# Patient Record
Sex: Male | Born: 1940
Health system: Southern US, Community
[De-identification: ages and names within clinical notes are randomized; demographics above are authoritative.]

## PROBLEM LIST (undated history)

## (undated) DIAGNOSIS — N183 Chronic kidney disease, stage 3 unspecified: Secondary | ICD-10-CM

## (undated) DIAGNOSIS — I059 Rheumatic mitral valve disease, unspecified: Secondary | ICD-10-CM

## (undated) DIAGNOSIS — I10 Essential (primary) hypertension: Secondary | ICD-10-CM

## (undated) DIAGNOSIS — R7303 Prediabetes: Secondary | ICD-10-CM

## (undated) DIAGNOSIS — I255 Ischemic cardiomyopathy: Secondary | ICD-10-CM

## (undated) DIAGNOSIS — I2510 Atherosclerotic heart disease of native coronary artery without angina pectoris: Secondary | ICD-10-CM

## (undated) DIAGNOSIS — M109 Gout, unspecified: Secondary | ICD-10-CM

## (undated) DIAGNOSIS — Z87442 Personal history of urinary calculi: Secondary | ICD-10-CM

## (undated) DIAGNOSIS — K219 Gastro-esophageal reflux disease without esophagitis: Secondary | ICD-10-CM

## (undated) DIAGNOSIS — M1990 Unspecified osteoarthritis, unspecified site: Secondary | ICD-10-CM

## (undated) HISTORY — DX: Essential (primary) hypertension: I10

## (undated) HISTORY — PX: CHOLECYSTECTOMY: SHX55

## (undated) HISTORY — DX: Chronic kidney disease, stage 3 (moderate): N18.3

## (undated) HISTORY — DX: Rheumatic mitral valve disease, unspecified: I05.9

## (undated) HISTORY — PX: TONSILLECTOMY: SUR1361

## (undated) HISTORY — DX: Ischemic cardiomyopathy: I25.5

## (undated) HISTORY — DX: Gout, unspecified: M10.9

## (undated) HISTORY — PX: LITHOTRIPSY: SUR834

## (undated) HISTORY — DX: Chronic kidney disease, stage 3 unspecified: N18.30

---

## 1997-08-12 ENCOUNTER — Other Ambulatory Visit: Admission: RE | Admit: 1997-08-12 | Discharge: 1997-08-12 | Payer: Self-pay | Admitting: Dermatology

## 2007-08-28 ENCOUNTER — Ambulatory Visit: Payer: Self-pay | Admitting: Cardiology

## 2008-09-05 ENCOUNTER — Ambulatory Visit: Payer: Self-pay | Admitting: Cardiology

## 2011-12-27 DIAGNOSIS — E7800 Pure hypercholesterolemia, unspecified: Secondary | ICD-10-CM | POA: Diagnosis not present

## 2012-03-11 DIAGNOSIS — R197 Diarrhea, unspecified: Secondary | ICD-10-CM | POA: Diagnosis not present

## 2012-03-11 DIAGNOSIS — R109 Unspecified abdominal pain: Secondary | ICD-10-CM | POA: Diagnosis not present

## 2012-06-30 DIAGNOSIS — J209 Acute bronchitis, unspecified: Secondary | ICD-10-CM | POA: Diagnosis not present

## 2012-07-10 DIAGNOSIS — J111 Influenza due to unidentified influenza virus with other respiratory manifestations: Secondary | ICD-10-CM | POA: Diagnosis not present

## 2012-07-17 DIAGNOSIS — K8020 Calculus of gallbladder without cholecystitis without obstruction: Secondary | ICD-10-CM | POA: Diagnosis not present

## 2012-07-20 DIAGNOSIS — K810 Acute cholecystitis: Secondary | ICD-10-CM | POA: Diagnosis not present

## 2012-07-20 DIAGNOSIS — E7800 Pure hypercholesterolemia, unspecified: Secondary | ICD-10-CM | POA: Diagnosis not present

## 2012-08-04 DIAGNOSIS — I10 Essential (primary) hypertension: Secondary | ICD-10-CM | POA: Diagnosis not present

## 2012-08-04 DIAGNOSIS — M109 Gout, unspecified: Secondary | ICD-10-CM | POA: Diagnosis not present

## 2012-09-20 DIAGNOSIS — E7800 Pure hypercholesterolemia, unspecified: Secondary | ICD-10-CM | POA: Diagnosis not present

## 2012-09-30 DIAGNOSIS — J019 Acute sinusitis, unspecified: Secondary | ICD-10-CM | POA: Diagnosis not present

## 2012-09-30 DIAGNOSIS — B879 Myiasis, unspecified: Secondary | ICD-10-CM | POA: Diagnosis not present

## 2012-09-30 DIAGNOSIS — J309 Allergic rhinitis, unspecified: Secondary | ICD-10-CM | POA: Diagnosis not present

## 2012-10-05 DIAGNOSIS — Z0000 Encounter for general adult medical examination without abnormal findings: Secondary | ICD-10-CM | POA: Diagnosis not present

## 2012-10-05 DIAGNOSIS — R011 Cardiac murmur, unspecified: Secondary | ICD-10-CM | POA: Diagnosis not present

## 2012-10-05 DIAGNOSIS — I10 Essential (primary) hypertension: Secondary | ICD-10-CM | POA: Diagnosis not present

## 2012-10-05 DIAGNOSIS — R002 Palpitations: Secondary | ICD-10-CM | POA: Diagnosis not present

## 2012-10-05 DIAGNOSIS — E7800 Pure hypercholesterolemia, unspecified: Secondary | ICD-10-CM | POA: Diagnosis not present

## 2012-10-05 DIAGNOSIS — M109 Gout, unspecified: Secondary | ICD-10-CM | POA: Diagnosis not present

## 2012-11-15 DIAGNOSIS — N200 Calculus of kidney: Secondary | ICD-10-CM | POA: Diagnosis not present

## 2012-11-15 DIAGNOSIS — R109 Unspecified abdominal pain: Secondary | ICD-10-CM | POA: Diagnosis not present

## 2012-11-17 DIAGNOSIS — N200 Calculus of kidney: Secondary | ICD-10-CM | POA: Diagnosis not present

## 2012-11-17 DIAGNOSIS — I10 Essential (primary) hypertension: Secondary | ICD-10-CM | POA: Diagnosis not present

## 2012-11-17 DIAGNOSIS — R3129 Other microscopic hematuria: Secondary | ICD-10-CM | POA: Diagnosis not present

## 2012-11-17 DIAGNOSIS — N23 Unspecified renal colic: Secondary | ICD-10-CM | POA: Diagnosis not present

## 2012-11-17 DIAGNOSIS — N201 Calculus of ureter: Secondary | ICD-10-CM | POA: Diagnosis not present

## 2012-11-20 DIAGNOSIS — N201 Calculus of ureter: Secondary | ICD-10-CM | POA: Diagnosis not present

## 2012-11-20 DIAGNOSIS — Z01810 Encounter for preprocedural cardiovascular examination: Secondary | ICD-10-CM | POA: Diagnosis not present

## 2012-11-20 DIAGNOSIS — R3129 Other microscopic hematuria: Secondary | ICD-10-CM | POA: Diagnosis not present

## 2012-11-20 DIAGNOSIS — N200 Calculus of kidney: Secondary | ICD-10-CM | POA: Diagnosis not present

## 2012-11-20 DIAGNOSIS — I10 Essential (primary) hypertension: Secondary | ICD-10-CM | POA: Diagnosis not present

## 2012-11-20 DIAGNOSIS — Z01812 Encounter for preprocedural laboratory examination: Secondary | ICD-10-CM | POA: Diagnosis not present

## 2012-11-20 DIAGNOSIS — I4949 Other premature depolarization: Secondary | ICD-10-CM | POA: Diagnosis not present

## 2012-11-20 DIAGNOSIS — Z01818 Encounter for other preprocedural examination: Secondary | ICD-10-CM | POA: Diagnosis not present

## 2012-11-20 DIAGNOSIS — R350 Frequency of micturition: Secondary | ICD-10-CM | POA: Diagnosis not present

## 2012-11-22 DIAGNOSIS — N200 Calculus of kidney: Secondary | ICD-10-CM | POA: Diagnosis not present

## 2012-11-22 DIAGNOSIS — R350 Frequency of micturition: Secondary | ICD-10-CM | POA: Diagnosis not present

## 2012-11-22 DIAGNOSIS — I4949 Other premature depolarization: Secondary | ICD-10-CM | POA: Diagnosis not present

## 2012-11-22 DIAGNOSIS — I10 Essential (primary) hypertension: Secondary | ICD-10-CM | POA: Diagnosis not present

## 2012-11-22 DIAGNOSIS — R3129 Other microscopic hematuria: Secondary | ICD-10-CM | POA: Diagnosis not present

## 2012-11-22 DIAGNOSIS — N201 Calculus of ureter: Secondary | ICD-10-CM | POA: Diagnosis not present

## 2012-12-04 DIAGNOSIS — I059 Rheumatic mitral valve disease, unspecified: Secondary | ICD-10-CM | POA: Diagnosis not present

## 2012-12-04 DIAGNOSIS — R011 Cardiac murmur, unspecified: Secondary | ICD-10-CM | POA: Diagnosis not present

## 2012-12-04 DIAGNOSIS — R079 Chest pain, unspecified: Secondary | ICD-10-CM | POA: Diagnosis not present

## 2012-12-07 DIAGNOSIS — S37019A Minor contusion of unspecified kidney, initial encounter: Secondary | ICD-10-CM | POA: Diagnosis not present

## 2012-12-07 DIAGNOSIS — N200 Calculus of kidney: Secondary | ICD-10-CM | POA: Diagnosis not present

## 2012-12-07 DIAGNOSIS — R3129 Other microscopic hematuria: Secondary | ICD-10-CM | POA: Diagnosis not present

## 2012-12-07 DIAGNOSIS — N201 Calculus of ureter: Secondary | ICD-10-CM | POA: Diagnosis not present

## 2012-12-11 DIAGNOSIS — R3129 Other microscopic hematuria: Secondary | ICD-10-CM | POA: Diagnosis not present

## 2012-12-11 DIAGNOSIS — N200 Calculus of kidney: Secondary | ICD-10-CM | POA: Diagnosis not present

## 2012-12-12 DIAGNOSIS — D485 Neoplasm of uncertain behavior of skin: Secondary | ICD-10-CM | POA: Diagnosis not present

## 2012-12-12 DIAGNOSIS — L570 Actinic keratosis: Secondary | ICD-10-CM | POA: Diagnosis not present

## 2012-12-12 DIAGNOSIS — L7211 Pilar cyst: Secondary | ICD-10-CM | POA: Diagnosis not present

## 2012-12-19 DIAGNOSIS — E538 Deficiency of other specified B group vitamins: Secondary | ICD-10-CM | POA: Diagnosis not present

## 2012-12-19 DIAGNOSIS — R5381 Other malaise: Secondary | ICD-10-CM | POA: Diagnosis not present

## 2012-12-25 DIAGNOSIS — R350 Frequency of micturition: Secondary | ICD-10-CM | POA: Diagnosis not present

## 2012-12-25 DIAGNOSIS — S37019A Minor contusion of unspecified kidney, initial encounter: Secondary | ICD-10-CM | POA: Diagnosis not present

## 2012-12-25 DIAGNOSIS — I10 Essential (primary) hypertension: Secondary | ICD-10-CM | POA: Diagnosis not present

## 2012-12-25 DIAGNOSIS — N200 Calculus of kidney: Secondary | ICD-10-CM | POA: Diagnosis not present

## 2013-01-14 DIAGNOSIS — E119 Type 2 diabetes mellitus without complications: Secondary | ICD-10-CM | POA: Diagnosis not present

## 2013-01-14 DIAGNOSIS — I10 Essential (primary) hypertension: Secondary | ICD-10-CM | POA: Diagnosis not present

## 2013-01-14 DIAGNOSIS — R799 Abnormal finding of blood chemistry, unspecified: Secondary | ICD-10-CM | POA: Diagnosis not present

## 2013-01-14 DIAGNOSIS — N201 Calculus of ureter: Secondary | ICD-10-CM | POA: Diagnosis not present

## 2013-01-14 DIAGNOSIS — N179 Acute kidney failure, unspecified: Secondary | ICD-10-CM | POA: Diagnosis not present

## 2013-01-14 DIAGNOSIS — R61 Generalized hyperhidrosis: Secondary | ICD-10-CM | POA: Diagnosis not present

## 2013-01-14 DIAGNOSIS — N200 Calculus of kidney: Secondary | ICD-10-CM | POA: Diagnosis not present

## 2013-01-14 DIAGNOSIS — N1330 Unspecified hydronephrosis: Secondary | ICD-10-CM | POA: Diagnosis not present

## 2013-01-14 DIAGNOSIS — R5381 Other malaise: Secondary | ICD-10-CM | POA: Diagnosis not present

## 2013-01-14 DIAGNOSIS — Z79899 Other long term (current) drug therapy: Secondary | ICD-10-CM | POA: Diagnosis not present

## 2013-01-14 DIAGNOSIS — N139 Obstructive and reflux uropathy, unspecified: Secondary | ICD-10-CM | POA: Diagnosis not present

## 2013-01-14 DIAGNOSIS — M109 Gout, unspecified: Secondary | ICD-10-CM | POA: Diagnosis not present

## 2013-01-14 DIAGNOSIS — I479 Paroxysmal tachycardia, unspecified: Secondary | ICD-10-CM | POA: Diagnosis not present

## 2013-01-14 DIAGNOSIS — N401 Enlarged prostate with lower urinary tract symptoms: Secondary | ICD-10-CM | POA: Diagnosis not present

## 2013-01-14 DIAGNOSIS — Z8049 Family history of malignant neoplasm of other genital organs: Secondary | ICD-10-CM | POA: Diagnosis not present

## 2013-01-15 DIAGNOSIS — R0989 Other specified symptoms and signs involving the circulatory and respiratory systems: Secondary | ICD-10-CM | POA: Diagnosis not present

## 2013-01-15 DIAGNOSIS — N1330 Unspecified hydronephrosis: Secondary | ICD-10-CM | POA: Diagnosis not present

## 2013-01-15 DIAGNOSIS — R5381 Other malaise: Secondary | ICD-10-CM | POA: Diagnosis not present

## 2013-01-15 DIAGNOSIS — R5383 Other fatigue: Secondary | ICD-10-CM | POA: Diagnosis not present

## 2013-01-15 DIAGNOSIS — N200 Calculus of kidney: Secondary | ICD-10-CM | POA: Diagnosis not present

## 2013-01-15 DIAGNOSIS — N201 Calculus of ureter: Secondary | ICD-10-CM | POA: Diagnosis not present

## 2013-01-15 DIAGNOSIS — S37019A Minor contusion of unspecified kidney, initial encounter: Secondary | ICD-10-CM | POA: Diagnosis not present

## 2013-01-16 DIAGNOSIS — N139 Obstructive and reflux uropathy, unspecified: Secondary | ICD-10-CM | POA: Diagnosis not present

## 2013-01-16 DIAGNOSIS — R61 Generalized hyperhidrosis: Secondary | ICD-10-CM | POA: Diagnosis not present

## 2013-01-16 DIAGNOSIS — N201 Calculus of ureter: Secondary | ICD-10-CM | POA: Diagnosis not present

## 2013-01-16 DIAGNOSIS — R5381 Other malaise: Secondary | ICD-10-CM | POA: Diagnosis not present

## 2013-01-16 DIAGNOSIS — N401 Enlarged prostate with lower urinary tract symptoms: Secondary | ICD-10-CM | POA: Diagnosis not present

## 2013-01-16 DIAGNOSIS — N179 Acute kidney failure, unspecified: Secondary | ICD-10-CM | POA: Diagnosis not present

## 2013-01-16 DIAGNOSIS — Z466 Encounter for fitting and adjustment of urinary device: Secondary | ICD-10-CM | POA: Diagnosis not present

## 2013-01-16 DIAGNOSIS — N1330 Unspecified hydronephrosis: Secondary | ICD-10-CM | POA: Diagnosis not present

## 2013-01-16 DIAGNOSIS — I10 Essential (primary) hypertension: Secondary | ICD-10-CM | POA: Diagnosis not present

## 2013-01-17 DIAGNOSIS — N201 Calculus of ureter: Secondary | ICD-10-CM | POA: Diagnosis not present

## 2013-01-17 DIAGNOSIS — S37019A Minor contusion of unspecified kidney, initial encounter: Secondary | ICD-10-CM | POA: Diagnosis not present

## 2013-01-22 DIAGNOSIS — N400 Enlarged prostate without lower urinary tract symptoms: Secondary | ICD-10-CM | POA: Diagnosis not present

## 2013-01-22 DIAGNOSIS — N201 Calculus of ureter: Secondary | ICD-10-CM | POA: Diagnosis not present

## 2013-02-22 DIAGNOSIS — N201 Calculus of ureter: Secondary | ICD-10-CM | POA: Diagnosis not present

## 2013-10-09 DIAGNOSIS — E7800 Pure hypercholesterolemia, unspecified: Secondary | ICD-10-CM | POA: Diagnosis not present

## 2013-10-09 DIAGNOSIS — E109 Type 1 diabetes mellitus without complications: Secondary | ICD-10-CM | POA: Diagnosis not present

## 2013-10-09 DIAGNOSIS — M109 Gout, unspecified: Secondary | ICD-10-CM | POA: Diagnosis not present

## 2013-10-09 DIAGNOSIS — R5381 Other malaise: Secondary | ICD-10-CM | POA: Diagnosis not present

## 2013-10-09 DIAGNOSIS — E538 Deficiency of other specified B group vitamins: Secondary | ICD-10-CM | POA: Diagnosis not present

## 2013-10-09 DIAGNOSIS — K810 Acute cholecystitis: Secondary | ICD-10-CM | POA: Diagnosis not present

## 2013-10-09 DIAGNOSIS — I10 Essential (primary) hypertension: Secondary | ICD-10-CM | POA: Diagnosis not present

## 2013-10-15 DIAGNOSIS — E538 Deficiency of other specified B group vitamins: Secondary | ICD-10-CM | POA: Diagnosis not present

## 2013-10-15 DIAGNOSIS — Z1331 Encounter for screening for depression: Secondary | ICD-10-CM | POA: Diagnosis not present

## 2013-10-15 DIAGNOSIS — Z0000 Encounter for general adult medical examination without abnormal findings: Secondary | ICD-10-CM | POA: Diagnosis not present

## 2013-10-15 DIAGNOSIS — R011 Cardiac murmur, unspecified: Secondary | ICD-10-CM | POA: Diagnosis not present

## 2013-10-15 DIAGNOSIS — Z23 Encounter for immunization: Secondary | ICD-10-CM | POA: Diagnosis not present

## 2013-10-15 DIAGNOSIS — E7800 Pure hypercholesterolemia, unspecified: Secondary | ICD-10-CM | POA: Diagnosis not present

## 2013-10-15 DIAGNOSIS — M109 Gout, unspecified: Secondary | ICD-10-CM | POA: Diagnosis not present

## 2013-10-18 DIAGNOSIS — R011 Cardiac murmur, unspecified: Secondary | ICD-10-CM | POA: Diagnosis not present

## 2013-10-18 DIAGNOSIS — I059 Rheumatic mitral valve disease, unspecified: Secondary | ICD-10-CM | POA: Diagnosis not present

## 2013-10-18 DIAGNOSIS — I519 Heart disease, unspecified: Secondary | ICD-10-CM | POA: Diagnosis not present

## 2013-10-25 DIAGNOSIS — I10 Essential (primary) hypertension: Secondary | ICD-10-CM | POA: Diagnosis not present

## 2013-10-25 DIAGNOSIS — R011 Cardiac murmur, unspecified: Secondary | ICD-10-CM | POA: Diagnosis not present

## 2014-01-24 DIAGNOSIS — E538 Deficiency of other specified B group vitamins: Secondary | ICD-10-CM | POA: Diagnosis not present

## 2014-01-24 DIAGNOSIS — K810 Acute cholecystitis: Secondary | ICD-10-CM | POA: Diagnosis not present

## 2014-01-24 DIAGNOSIS — E119 Type 2 diabetes mellitus without complications: Secondary | ICD-10-CM | POA: Diagnosis not present

## 2014-01-24 DIAGNOSIS — I10 Essential (primary) hypertension: Secondary | ICD-10-CM | POA: Diagnosis not present

## 2014-01-24 DIAGNOSIS — M109 Gout, unspecified: Secondary | ICD-10-CM | POA: Diagnosis not present

## 2014-01-24 DIAGNOSIS — E7800 Pure hypercholesterolemia, unspecified: Secondary | ICD-10-CM | POA: Diagnosis not present

## 2014-03-04 DIAGNOSIS — L570 Actinic keratosis: Secondary | ICD-10-CM | POA: Diagnosis not present

## 2014-03-04 DIAGNOSIS — L720 Epidermal cyst: Secondary | ICD-10-CM | POA: Diagnosis not present

## 2014-04-04 DIAGNOSIS — L720 Epidermal cyst: Secondary | ICD-10-CM | POA: Diagnosis not present

## 2014-04-04 DIAGNOSIS — D485 Neoplasm of uncertain behavior of skin: Secondary | ICD-10-CM | POA: Diagnosis not present

## 2014-05-16 DIAGNOSIS — M7020 Olecranon bursitis, unspecified elbow: Secondary | ICD-10-CM | POA: Diagnosis not present

## 2014-05-16 DIAGNOSIS — M10022 Idiopathic gout, left elbow: Secondary | ICD-10-CM | POA: Diagnosis not present

## 2014-06-06 DIAGNOSIS — M7022 Olecranon bursitis, left elbow: Secondary | ICD-10-CM | POA: Diagnosis not present

## 2014-06-06 DIAGNOSIS — M25551 Pain in right hip: Secondary | ICD-10-CM | POA: Diagnosis not present

## 2014-06-06 DIAGNOSIS — M25522 Pain in left elbow: Secondary | ICD-10-CM | POA: Diagnosis not present

## 2014-06-06 DIAGNOSIS — M1611 Unilateral primary osteoarthritis, right hip: Secondary | ICD-10-CM | POA: Diagnosis not present

## 2014-07-17 DIAGNOSIS — J019 Acute sinusitis, unspecified: Secondary | ICD-10-CM | POA: Diagnosis not present

## 2014-08-15 DIAGNOSIS — K120 Recurrent oral aphthae: Secondary | ICD-10-CM | POA: Diagnosis not present

## 2014-09-26 DIAGNOSIS — K146 Glossodynia: Secondary | ICD-10-CM | POA: Diagnosis not present

## 2014-09-26 DIAGNOSIS — B002 Herpesviral gingivostomatitis and pharyngotonsillitis: Secondary | ICD-10-CM | POA: Diagnosis not present

## 2014-09-26 DIAGNOSIS — K1379 Other lesions of oral mucosa: Secondary | ICD-10-CM | POA: Diagnosis not present

## 2014-11-05 DIAGNOSIS — D519 Vitamin B12 deficiency anemia, unspecified: Secondary | ICD-10-CM | POA: Diagnosis not present

## 2014-11-05 DIAGNOSIS — I10 Essential (primary) hypertension: Secondary | ICD-10-CM | POA: Diagnosis not present

## 2014-11-05 DIAGNOSIS — K810 Acute cholecystitis: Secondary | ICD-10-CM | POA: Diagnosis not present

## 2014-11-05 DIAGNOSIS — E780 Pure hypercholesterolemia: Secondary | ICD-10-CM | POA: Diagnosis not present

## 2014-11-05 DIAGNOSIS — N400 Enlarged prostate without lower urinary tract symptoms: Secondary | ICD-10-CM | POA: Diagnosis not present

## 2014-11-05 DIAGNOSIS — E1165 Type 2 diabetes mellitus with hyperglycemia: Secondary | ICD-10-CM | POA: Diagnosis not present

## 2014-11-05 DIAGNOSIS — R5382 Chronic fatigue, unspecified: Secondary | ICD-10-CM | POA: Diagnosis not present

## 2014-11-09 DIAGNOSIS — I10 Essential (primary) hypertension: Secondary | ICD-10-CM | POA: Diagnosis not present

## 2014-11-09 DIAGNOSIS — R011 Cardiac murmur, unspecified: Secondary | ICD-10-CM | POA: Diagnosis not present

## 2014-11-09 DIAGNOSIS — K120 Recurrent oral aphthae: Secondary | ICD-10-CM | POA: Diagnosis not present

## 2014-11-09 DIAGNOSIS — E780 Pure hypercholesterolemia: Secondary | ICD-10-CM | POA: Diagnosis not present

## 2014-11-09 DIAGNOSIS — Z0001 Encounter for general adult medical examination with abnormal findings: Secondary | ICD-10-CM | POA: Diagnosis not present

## 2014-11-14 DIAGNOSIS — I341 Nonrheumatic mitral (valve) prolapse: Secondary | ICD-10-CM | POA: Diagnosis not present

## 2014-11-14 DIAGNOSIS — I517 Cardiomegaly: Secondary | ICD-10-CM | POA: Diagnosis not present

## 2014-11-14 DIAGNOSIS — R011 Cardiac murmur, unspecified: Secondary | ICD-10-CM | POA: Diagnosis not present

## 2014-11-14 DIAGNOSIS — R931 Abnormal findings on diagnostic imaging of heart and coronary circulation: Secondary | ICD-10-CM | POA: Diagnosis not present

## 2014-11-14 DIAGNOSIS — I340 Nonrheumatic mitral (valve) insufficiency: Secondary | ICD-10-CM | POA: Diagnosis not present

## 2014-12-04 DIAGNOSIS — K120 Recurrent oral aphthae: Secondary | ICD-10-CM | POA: Diagnosis not present

## 2015-01-20 DIAGNOSIS — E780 Pure hypercholesterolemia: Secondary | ICD-10-CM | POA: Diagnosis not present

## 2015-01-20 DIAGNOSIS — E162 Hypoglycemia, unspecified: Secondary | ICD-10-CM | POA: Diagnosis not present

## 2015-01-20 DIAGNOSIS — M10022 Idiopathic gout, left elbow: Secondary | ICD-10-CM | POA: Diagnosis not present

## 2015-01-20 DIAGNOSIS — I10 Essential (primary) hypertension: Secondary | ICD-10-CM | POA: Diagnosis not present

## 2015-01-29 DIAGNOSIS — R011 Cardiac murmur, unspecified: Secondary | ICD-10-CM | POA: Diagnosis not present

## 2015-01-29 DIAGNOSIS — Z23 Encounter for immunization: Secondary | ICD-10-CM | POA: Diagnosis not present

## 2015-01-29 DIAGNOSIS — Z1389 Encounter for screening for other disorder: Secondary | ICD-10-CM | POA: Diagnosis not present

## 2015-01-29 DIAGNOSIS — I10 Essential (primary) hypertension: Secondary | ICD-10-CM | POA: Diagnosis not present

## 2015-05-06 DIAGNOSIS — M10022 Idiopathic gout, left elbow: Secondary | ICD-10-CM | POA: Diagnosis not present

## 2015-05-06 DIAGNOSIS — W57XXXA Bitten or stung by nonvenomous insect and other nonvenomous arthropods, initial encounter: Secondary | ICD-10-CM | POA: Diagnosis not present

## 2015-05-06 DIAGNOSIS — R5382 Chronic fatigue, unspecified: Secondary | ICD-10-CM | POA: Diagnosis not present

## 2015-05-06 DIAGNOSIS — I10 Essential (primary) hypertension: Secondary | ICD-10-CM | POA: Diagnosis not present

## 2015-05-06 DIAGNOSIS — E1165 Type 2 diabetes mellitus with hyperglycemia: Secondary | ICD-10-CM | POA: Diagnosis not present

## 2015-05-06 DIAGNOSIS — E7800 Pure hypercholesterolemia, unspecified: Secondary | ICD-10-CM | POA: Diagnosis not present

## 2015-05-12 DIAGNOSIS — E1165 Type 2 diabetes mellitus with hyperglycemia: Secondary | ICD-10-CM | POA: Diagnosis not present

## 2015-05-12 DIAGNOSIS — I10 Essential (primary) hypertension: Secondary | ICD-10-CM | POA: Diagnosis not present

## 2015-08-07 DIAGNOSIS — N132 Hydronephrosis with renal and ureteral calculous obstruction: Secondary | ICD-10-CM | POA: Diagnosis not present

## 2015-08-07 DIAGNOSIS — M109 Gout, unspecified: Secondary | ICD-10-CM | POA: Diagnosis present

## 2015-08-07 DIAGNOSIS — I10 Essential (primary) hypertension: Secondary | ICD-10-CM | POA: Diagnosis not present

## 2015-08-07 DIAGNOSIS — S299XXA Unspecified injury of thorax, initial encounter: Secondary | ICD-10-CM | POA: Diagnosis not present

## 2015-08-07 DIAGNOSIS — E860 Dehydration: Secondary | ICD-10-CM | POA: Diagnosis not present

## 2015-08-07 DIAGNOSIS — Z7984 Long term (current) use of oral hypoglycemic drugs: Secondary | ICD-10-CM | POA: Diagnosis not present

## 2015-08-07 DIAGNOSIS — N139 Obstructive and reflux uropathy, unspecified: Secondary | ICD-10-CM | POA: Diagnosis not present

## 2015-08-07 DIAGNOSIS — Z79899 Other long term (current) drug therapy: Secondary | ICD-10-CM | POA: Diagnosis not present

## 2015-08-07 DIAGNOSIS — A045 Campylobacter enteritis: Secondary | ICD-10-CM | POA: Diagnosis not present

## 2015-08-07 DIAGNOSIS — K859 Acute pancreatitis without necrosis or infection, unspecified: Secondary | ICD-10-CM | POA: Diagnosis not present

## 2015-08-07 DIAGNOSIS — E119 Type 2 diabetes mellitus without complications: Secondary | ICD-10-CM | POA: Diagnosis not present

## 2015-08-07 DIAGNOSIS — K5190 Ulcerative colitis, unspecified, without complications: Secondary | ICD-10-CM | POA: Diagnosis not present

## 2015-08-07 DIAGNOSIS — R1032 Left lower quadrant pain: Secondary | ICD-10-CM | POA: Diagnosis not present

## 2015-11-14 DIAGNOSIS — I10 Essential (primary) hypertension: Secondary | ICD-10-CM | POA: Diagnosis not present

## 2015-11-14 DIAGNOSIS — R5382 Chronic fatigue, unspecified: Secondary | ICD-10-CM | POA: Diagnosis not present

## 2015-11-14 DIAGNOSIS — D519 Vitamin B12 deficiency anemia, unspecified: Secondary | ICD-10-CM | POA: Diagnosis not present

## 2015-11-14 DIAGNOSIS — E1165 Type 2 diabetes mellitus with hyperglycemia: Secondary | ICD-10-CM | POA: Diagnosis not present

## 2015-11-14 DIAGNOSIS — E7800 Pure hypercholesterolemia, unspecified: Secondary | ICD-10-CM | POA: Diagnosis not present

## 2015-11-19 DIAGNOSIS — Z0001 Encounter for general adult medical examination with abnormal findings: Secondary | ICD-10-CM | POA: Diagnosis not present

## 2015-11-19 DIAGNOSIS — I10 Essential (primary) hypertension: Secondary | ICD-10-CM | POA: Diagnosis not present

## 2015-11-19 DIAGNOSIS — E7800 Pure hypercholesterolemia, unspecified: Secondary | ICD-10-CM | POA: Diagnosis not present

## 2015-11-19 DIAGNOSIS — Z6831 Body mass index (BMI) 31.0-31.9, adult: Secondary | ICD-10-CM | POA: Diagnosis not present

## 2015-11-19 DIAGNOSIS — R011 Cardiac murmur, unspecified: Secondary | ICD-10-CM | POA: Diagnosis not present

## 2015-11-19 DIAGNOSIS — E1165 Type 2 diabetes mellitus with hyperglycemia: Secondary | ICD-10-CM | POA: Diagnosis not present

## 2016-02-25 DIAGNOSIS — Z6831 Body mass index (BMI) 31.0-31.9, adult: Secondary | ICD-10-CM | POA: Diagnosis not present

## 2016-02-25 DIAGNOSIS — J00 Acute nasopharyngitis [common cold]: Secondary | ICD-10-CM | POA: Diagnosis not present

## 2016-03-12 DIAGNOSIS — Z23 Encounter for immunization: Secondary | ICD-10-CM | POA: Diagnosis not present

## 2016-06-17 DIAGNOSIS — Z6830 Body mass index (BMI) 30.0-30.9, adult: Secondary | ICD-10-CM | POA: Diagnosis not present

## 2016-06-17 DIAGNOSIS — R197 Diarrhea, unspecified: Secondary | ICD-10-CM | POA: Diagnosis not present

## 2016-07-16 DIAGNOSIS — H1711 Central corneal opacity, right eye: Secondary | ICD-10-CM | POA: Diagnosis not present

## 2016-07-16 DIAGNOSIS — T1511XA Foreign body in conjunctival sac, right eye, initial encounter: Secondary | ICD-10-CM | POA: Diagnosis not present

## 2016-07-19 DIAGNOSIS — B0059 Other herpesviral disease of eye: Secondary | ICD-10-CM | POA: Diagnosis not present

## 2016-07-19 DIAGNOSIS — B0052 Herpesviral keratitis: Secondary | ICD-10-CM | POA: Diagnosis not present

## 2016-07-22 DIAGNOSIS — B0059 Other herpesviral disease of eye: Secondary | ICD-10-CM | POA: Diagnosis not present

## 2016-07-22 DIAGNOSIS — B0052 Herpesviral keratitis: Secondary | ICD-10-CM | POA: Diagnosis not present

## 2016-07-28 DIAGNOSIS — J019 Acute sinusitis, unspecified: Secondary | ICD-10-CM | POA: Diagnosis not present

## 2016-07-28 DIAGNOSIS — Z6831 Body mass index (BMI) 31.0-31.9, adult: Secondary | ICD-10-CM | POA: Diagnosis not present

## 2016-07-30 DIAGNOSIS — B0052 Herpesviral keratitis: Secondary | ICD-10-CM | POA: Diagnosis not present

## 2016-07-30 DIAGNOSIS — B0059 Other herpesviral disease of eye: Secondary | ICD-10-CM | POA: Diagnosis not present

## 2016-08-02 DIAGNOSIS — J019 Acute sinusitis, unspecified: Secondary | ICD-10-CM | POA: Diagnosis not present

## 2016-08-02 DIAGNOSIS — L299 Pruritus, unspecified: Secondary | ICD-10-CM | POA: Diagnosis not present

## 2016-08-02 DIAGNOSIS — Z6830 Body mass index (BMI) 30.0-30.9, adult: Secondary | ICD-10-CM | POA: Diagnosis not present

## 2016-09-10 DIAGNOSIS — H538 Other visual disturbances: Secondary | ICD-10-CM | POA: Diagnosis not present

## 2016-10-19 DIAGNOSIS — R5382 Chronic fatigue, unspecified: Secondary | ICD-10-CM | POA: Diagnosis not present

## 2016-10-19 DIAGNOSIS — I10 Essential (primary) hypertension: Secondary | ICD-10-CM | POA: Diagnosis not present

## 2016-10-19 DIAGNOSIS — E7800 Pure hypercholesterolemia, unspecified: Secondary | ICD-10-CM | POA: Diagnosis not present

## 2016-10-19 DIAGNOSIS — E1165 Type 2 diabetes mellitus with hyperglycemia: Secondary | ICD-10-CM | POA: Diagnosis not present

## 2016-10-21 DIAGNOSIS — E1165 Type 2 diabetes mellitus with hyperglycemia: Secondary | ICD-10-CM | POA: Diagnosis not present

## 2016-10-21 DIAGNOSIS — R011 Cardiac murmur, unspecified: Secondary | ICD-10-CM | POA: Diagnosis not present

## 2016-10-21 DIAGNOSIS — I10 Essential (primary) hypertension: Secondary | ICD-10-CM | POA: Diagnosis not present

## 2016-10-21 DIAGNOSIS — E7800 Pure hypercholesterolemia, unspecified: Secondary | ICD-10-CM | POA: Diagnosis not present

## 2016-10-21 DIAGNOSIS — Z6831 Body mass index (BMI) 31.0-31.9, adult: Secondary | ICD-10-CM | POA: Diagnosis not present

## 2016-11-09 DIAGNOSIS — R5383 Other fatigue: Secondary | ICD-10-CM | POA: Diagnosis not present

## 2016-11-09 DIAGNOSIS — Z6830 Body mass index (BMI) 30.0-30.9, adult: Secondary | ICD-10-CM | POA: Diagnosis not present

## 2016-11-15 DIAGNOSIS — R011 Cardiac murmur, unspecified: Secondary | ICD-10-CM | POA: Diagnosis not present

## 2016-11-15 DIAGNOSIS — I517 Cardiomegaly: Secondary | ICD-10-CM | POA: Diagnosis not present

## 2016-11-27 DIAGNOSIS — J019 Acute sinusitis, unspecified: Secondary | ICD-10-CM | POA: Diagnosis not present

## 2016-11-27 DIAGNOSIS — Z6830 Body mass index (BMI) 30.0-30.9, adult: Secondary | ICD-10-CM | POA: Diagnosis not present

## 2016-12-14 DIAGNOSIS — Z6830 Body mass index (BMI) 30.0-30.9, adult: Secondary | ICD-10-CM | POA: Diagnosis not present

## 2016-12-14 DIAGNOSIS — R05 Cough: Secondary | ICD-10-CM | POA: Diagnosis not present

## 2016-12-26 DIAGNOSIS — I4510 Unspecified right bundle-branch block: Secondary | ICD-10-CM | POA: Diagnosis not present

## 2016-12-26 DIAGNOSIS — R05 Cough: Secondary | ICD-10-CM | POA: Diagnosis not present

## 2016-12-26 DIAGNOSIS — Z9049 Acquired absence of other specified parts of digestive tract: Secondary | ICD-10-CM | POA: Diagnosis not present

## 2016-12-26 DIAGNOSIS — Z7982 Long term (current) use of aspirin: Secondary | ICD-10-CM | POA: Diagnosis not present

## 2016-12-26 DIAGNOSIS — R079 Chest pain, unspecified: Secondary | ICD-10-CM | POA: Diagnosis not present

## 2016-12-26 DIAGNOSIS — R739 Hyperglycemia, unspecified: Secondary | ICD-10-CM | POA: Diagnosis not present

## 2016-12-26 DIAGNOSIS — I10 Essential (primary) hypertension: Secondary | ICD-10-CM | POA: Diagnosis not present

## 2016-12-26 DIAGNOSIS — Z79899 Other long term (current) drug therapy: Secondary | ICD-10-CM | POA: Diagnosis not present

## 2016-12-26 DIAGNOSIS — Z87442 Personal history of urinary calculi: Secondary | ICD-10-CM | POA: Diagnosis not present

## 2016-12-26 DIAGNOSIS — M109 Gout, unspecified: Secondary | ICD-10-CM | POA: Diagnosis not present

## 2016-12-26 DIAGNOSIS — R0602 Shortness of breath: Secondary | ICD-10-CM | POA: Diagnosis not present

## 2016-12-26 DIAGNOSIS — R531 Weakness: Secondary | ICD-10-CM | POA: Diagnosis not present

## 2016-12-26 DIAGNOSIS — R0600 Dyspnea, unspecified: Secondary | ICD-10-CM | POA: Diagnosis not present

## 2016-12-31 DIAGNOSIS — R079 Chest pain, unspecified: Secondary | ICD-10-CM | POA: Diagnosis not present

## 2016-12-31 DIAGNOSIS — R931 Abnormal findings on diagnostic imaging of heart and coronary circulation: Secondary | ICD-10-CM | POA: Diagnosis not present

## 2017-01-04 ENCOUNTER — Encounter: Payer: Self-pay | Admitting: Cardiology

## 2017-01-04 NOTE — Progress Notes (Signed)
Cardiology Office Note  Date: 01/05/2017   ID: Lorene, Samaan 05-Apr-1941, MRN [id number]  PCP: Rory Percy, MD  Consulting Cardiologist: Rozann Lesches, MD   Chief Complaint  Patient presents with  . Abnormal Myoview  . Chest Pain    History of Present Illness: PATTY LOPEZGARCIA is a 76 y.o. male referred for cardiology consultation by Dr. Ronne Binning at [facility] for evaluation of chest pain and recent abnormal Myoview. I reviewed the available records which are limited. He tells me that over the last 5 weeks or so he has been having increasing episodes of exertional chest aching associated with shortness of breath and fatigue, has to stop what he is doing to catch his breath. This culminated in a more intense episode a week ago Sunday that occurred at rest and woke him up and he was admitted overnight at [facility] where he ruled out for myocardial infarction with troponin T levels. He states he was discharged home and returned later that week on Friday (August 31) to undergo a exercise Myoview as an outpatient. He was then subsequently contacted about the abnormal results and this office visit was scheduled.  He is here today with his wife. He tells me that he continues to have intermittent exertional chest discomfort and shortness of breath consistent with angina although he has not been pushing himself recently. 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 He lives in [place].  I reviewed his medications. He tells me that he has been taking a baby aspirin daily for the last several days, also on Toprol-XL and lisinopril with history of hypertension.  He reports a history of heart murmur and had an echocardiogram done at [facility] a few weeks ago for follow-up. I do not have these results although on examination it sounds like he has mitral regurgitation. This is been followed by Dr. Nadara Mustard.  Today we went over  the stress test results and discussed the implications which point toward potentially significant ischemic heart disease in more than one vascular distribution. I recommended a diagnostic cardiac catheterization to assess for revascularization options, discussed the risks and benefits, and he is in agreement to proceed.  Past Medical History:  Diagnosis Date  . Essential hypertension   . Gout   . Nephrolithiasis     Past Surgical History:  Procedure Laterality Date  . CHOLECYSTECTOMY    . LITHOTRIPSY    . TONSILLECTOMY      Current Outpatient Prescriptions  Medication Sig Dispense Refill  . lisinopril (PRINIVIL,ZESTRIL) 5 MG tablet Take 5 mg by mouth daily.    . metoprolol succinate (TOPROL-XL) 25 MG 24 hr tablet Take 25 mg by mouth daily.    Marland Kitchen aspirin EC 81 MG tablet Take 1 tablet (81 mg total) by mouth daily. 30 tablet 3   No current facility-administered medications for this visit.    Allergies:  Patient has no known allergies.   Social History: The patient  reports that he has never smoked. He has never used smokeless tobacco. He reports that he does not drink alcohol or use drugs.   Family History: The patient's family history includes Diabetes Mellitus II in his sister; Heart attack in his brother; Pancreatic cancer in his sister; Uterine cancer in his mother.   ROS:  Please see the history of present illness. Otherwise, complete review of systems is positive for none.  All other systems are reviewed and negative.  Physical Exam: VS:  BP 120/72   Pulse 90   Ht 6\' 2"  (1.88 m)   Wt 237 lb (107.5 kg)   SpO2 96%   BMI 30.43 kg/m , BMI Body mass index is 30.43 kg/m.  Wt Readings from Last 3 Encounters:  01/05/17 237 lb (107.5 kg)    General: Tall stature, appears comfortable at rest. HEENT: Conjunctiva and lids normal, oropharynx clear with moist mucosa. Neck: Supple, no elevated JVP or carotid bruits, no thyromegaly. Lungs: Clear to auscultation, nonlabored  breathing at rest. Cardiac: Regular rate and rhythm, no S3, 3/6 apical systolic murmur, no pericardial rub. Abdomen: Soft, nontender, bowel sounds present, no guarding or rebound. Extremities: No pitting edema, distal pulses 2+. Skin: Warm and dry. Musculoskeletal: No kyphosis. Neuropsychiatric: Alert and oriented x3, affect grossly appropriate.  ECG: I personally reviewed the tracing from 12/26/2016 which showed sinus rhythm with left anterior fascicular block, decreased R wave progression, and occasional PVCs.  Recent Labwork:  August 2018: Hemoglobin 14.4, platelets 165, d-dimer 0.15, potassium 4.2, BUN 17, creatinine 1.1, troponin T less than 0.01, BNP 384, TSH 0.43  Other Studies Reviewed Today:  Exercise Myoview 12/31/2016 [facility]): Patient exercised for 5 minutes and 25 seconds on the Bruce protocol achieving a maximum workload of 7 METs. Reportedly PVCs and NSVT were noted but reportedly no diagnostic ST segment changes. Large area of reversibility involving the anterior and apical walls consistent with ischemia. Moderate-sized fixed defect in the inferior wall consistent with scar. Moderate hypokinesis of the inferior septum. LVEF calculated at 45%. Overall high risk study.  Assessment and Plan:  1. Symptoms consistent with accelerating angina over the last 5 weeks as discussed above. Based on the information I have at hand, patient ruled out for ACS during recent admission at [facility]. Subsequently obtained exercise Myoview as an outpatient is concerning for multivessel distribution ischemic heart disease and indicated LVEF of 45%. He also was described to have had PVCs and NSVT on treadmill testing. We have discussed proceeding to a diagnostic cardiac catheterization to evaluate coronary anatomy for revascularization options. After reviewing the risks and benefits, he is in agreement to proceed. This is being scheduled within the next 24 hours. He  will continue an aspirin daily, beta blocker, and ACE inhibitor. We will request full set of lab work and chest x-ray done recently at [facility], and also the echocardiogram done a few weeks ago by report.  2. Reportedly long-standing heart murmur, on examination suggestive of mitral regurgitation. Requesting recent echocardiogram that was obtained by Dr. Nadara Mustard.  3. Essential hypertension, on Toprol-XL and lisinopril.  4. Uncertain lipid status. Will give empiric statin for cardiac catheterization.  Current medicines were reviewed with the patient today.  Disposition: Follow-up after cardiac catheterization.  Signed, Satira Sark, MD, [facility] 01/05/2017 11:17 AM    [facility] [place], [place], [place] [address] Phone: [phone]; Fax: [phone]

## 2017-01-05 ENCOUNTER — Encounter: Payer: Self-pay | Admitting: Cardiology

## 2017-01-05 ENCOUNTER — Other Ambulatory Visit: Payer: Self-pay | Admitting: Cardiology

## 2017-01-05 ENCOUNTER — Ambulatory Visit (INDEPENDENT_AMBULATORY_CARE_PROVIDER_SITE_OTHER): Payer: Medicare Other | Admitting: Cardiology

## 2017-01-05 ENCOUNTER — Telehealth: Payer: Self-pay | Admitting: Cardiology

## 2017-01-05 VITALS — BP 120/72 | HR 90 | Ht 74.0 in | Wt 237.0 lb

## 2017-01-05 DIAGNOSIS — I10 Essential (primary) hypertension: Secondary | ICD-10-CM

## 2017-01-05 DIAGNOSIS — I472 Ventricular tachycardia: Secondary | ICD-10-CM | POA: Diagnosis not present

## 2017-01-05 DIAGNOSIS — R9439 Abnormal result of other cardiovascular function study: Secondary | ICD-10-CM

## 2017-01-05 DIAGNOSIS — I4729 Other ventricular tachycardia: Secondary | ICD-10-CM

## 2017-01-05 DIAGNOSIS — I200 Unstable angina: Secondary | ICD-10-CM | POA: Diagnosis not present

## 2017-01-05 DIAGNOSIS — R011 Cardiac murmur, unspecified: Secondary | ICD-10-CM

## 2017-01-05 MED ORDER — ASPIRIN EC 81 MG PO TBEC: 81.0000 mg | DELAYED_RELEASE_TABLET | Freq: Every day | ORAL | 3 refills | Status: DC

## 2017-01-05 NOTE — Patient Instructions (Signed)
Medication Instructions:  Your physician recommends that you continue on your current medications as directed. Please refer to the Current Medication list given to you today.  Labwork: NONE  Testing/Procedures:  You are scheduled for a Cardiac Catheterization on Thursday, September 6 with Dr. Shelva Majestic.  1. Please arrive at the [facility] (Main Entrance A) at [facility]: [address] [place], [place] [address] at 1:00 PM (two hours before your procedure to ensure your preparation). Free valet parking service is available.   Special note: Every effort is made to have your procedure done on time. Please understand that emergencies sometimes delay scheduled procedures.  2. Diet: You may have a clear liquid breakfast, but nothing after 8 AM on your procedure day.  3. Labs: None needed.  4. Medication instructions in preparation for your procedure:  On the morning of your procedure, take your Aspirin and any morning medicines NOT listed above.  You may use sips of water.  5. Plan for one night stay--bring personal belongings. 6. Bring a current list of your medications and current insurance cards. 7. You MUST have a responsible person to drive you home. 8. Someone MUST be with you the first 24 hours after you arrive home or your discharge will be delayed. 9. Please wear clothes that are easy to get on and off and wear slip-on shoes.  Thank you for allowing [place] to care for you!   -- [facility]  Follow-Up: Your physician recommends that you schedule a follow-up appointment in: 1 MONTH WITH DR. Domenic Polite AFTER CATH  Any Other Special Instructions Will Be Listed Below (If Applicable).  If you need a refill on your cardiac medications before your next appointment, please call your pharmacy.

## 2017-01-05 NOTE — Telephone Encounter (Signed)
L heart cath- accelerating angina abnormal myoview Scheduled for tomorrow 9/6 with dr. Claiborne Billings at 3:00

## 2017-01-06 ENCOUNTER — Ambulatory Visit (HOSPITAL_COMMUNITY)
Admission: RE | Disposition: A | Discharge status: Home / Self Care | Payer: Self-pay | Source: Ambulatory Visit | Attending: Cardiovascular Disease

## 2017-01-06 ENCOUNTER — Encounter (HOSPITAL_COMMUNITY): Payer: Self-pay | Admitting: General Practice

## 2017-01-06 ENCOUNTER — Ambulatory Visit (HOSPITAL_COMMUNITY)
Admission: RE | Admit: 2017-01-06 | Discharge: 2017-01-07 | Disposition: A | Discharge status: Home / Self Care | Payer: Medicare Other | Source: Ambulatory Visit | Attending: Cardiovascular Disease | Admitting: Cardiovascular Disease

## 2017-01-06 DIAGNOSIS — I2582 Chronic total occlusion of coronary artery: Secondary | ICD-10-CM | POA: Insufficient documentation

## 2017-01-06 DIAGNOSIS — R011 Cardiac murmur, unspecified: Secondary | ICD-10-CM | POA: Diagnosis not present

## 2017-01-06 DIAGNOSIS — I2584 Coronary atherosclerosis due to calcified coronary lesion: Secondary | ICD-10-CM | POA: Diagnosis not present

## 2017-01-06 DIAGNOSIS — M109 Gout, unspecified: Secondary | ICD-10-CM | POA: Diagnosis not present

## 2017-01-06 DIAGNOSIS — I25110 Atherosclerotic heart disease of native coronary artery with unstable angina pectoris: Secondary | ICD-10-CM | POA: Diagnosis not present

## 2017-01-06 DIAGNOSIS — R9439 Abnormal result of other cardiovascular function study: Secondary | ICD-10-CM

## 2017-01-06 DIAGNOSIS — I5021 Acute systolic (congestive) heart failure: Secondary | ICD-10-CM | POA: Diagnosis not present

## 2017-01-06 DIAGNOSIS — I110 Hypertensive heart disease with heart failure: Secondary | ICD-10-CM | POA: Diagnosis not present

## 2017-01-06 DIAGNOSIS — Z7982 Long term (current) use of aspirin: Secondary | ICD-10-CM | POA: Insufficient documentation

## 2017-01-06 DIAGNOSIS — Z955 Presence of coronary angioplasty implant and graft: Secondary | ICD-10-CM

## 2017-01-06 DIAGNOSIS — E785 Hyperlipidemia, unspecified: Secondary | ICD-10-CM

## 2017-01-06 HISTORY — DX: Prediabetes: R73.03

## 2017-01-06 HISTORY — PX: LEFT HEART CATH AND CORONARY ANGIOGRAPHY: CATH118249

## 2017-01-06 HISTORY — DX: Personal history of urinary calculi: Z87.442

## 2017-01-06 HISTORY — DX: Gastro-esophageal reflux disease without esophagitis: K21.9

## 2017-01-06 HISTORY — PX: CARDIAC CATHETERIZATION: SHX172

## 2017-01-06 HISTORY — PX: CORONARY STENT INTERVENTION: CATH118234

## 2017-01-06 HISTORY — DX: Atherosclerotic heart disease of native coronary artery without angina pectoris: I25.10

## 2017-01-06 LAB — BASIC METABOLIC PANEL
ANION GAP: 9 (ref 5–15)
BUN: 19 mg/dL (ref 6–20)
CALCIUM: 9.6 mg/dL (ref 8.9–10.3)
CHLORIDE: 104 mmol/L (ref 101–111)
CO2: 25 mmol/L (ref 22–32)
Creatinine, Ser: 1.21 mg/dL (ref 0.61–1.24)
GFR calc non Af Amer: 57 mL/min — ABNORMAL LOW (ref >60)
Glucose, Bld: 129 mg/dL — ABNORMAL HIGH (ref 65–99)
Potassium: 4.1 mmol/L (ref 3.5–5.1)
Sodium: 138 mmol/L (ref 135–145)

## 2017-01-06 LAB — CBC
HEMATOCRIT: 40.7 % (ref 39.0–52.0)
HEMOGLOBIN: 13.8 g/dL (ref 13.0–17.0)
MCH: 29.7 pg (ref 26.0–34.0)
MCHC: 33.9 g/dL (ref 30.0–36.0)
MCV: 87.5 fL (ref 78.0–100.0)
Platelets: 184 10*3/uL (ref 150–400)
RBC: 4.65 MIL/uL (ref 4.22–5.81)
RDW: 13.8 % (ref 11.5–15.5)
WBC: 9.4 10*3/uL (ref 4.0–10.5)

## 2017-01-06 LAB — PROTIME-INR
INR: 1.03
PROTHROMBIN TIME: 13.5 s (ref 11.4–15.2)

## 2017-01-06 LAB — POCT ACTIVATED CLOTTING TIME: Activated Clotting Time: 450 seconds

## 2017-01-06 SURGERY — LEFT HEART CATH AND CORONARY ANGIOGRAPHY
Anesthesia: LOCAL

## 2017-01-06 MED ORDER — IOPAMIDOL (ISOVUE-370) INJECTION 76%
INTRAVENOUS | Status: DC | PRN
  Administered 2017-01-06: 220 mL via INTRA_ARTERIAL

## 2017-01-06 MED ORDER — IOPAMIDOL (ISOVUE-370) INJECTION 76%
INTRAVENOUS | Status: AC
  Filled 2017-01-06: qty 50

## 2017-01-06 MED ORDER — ATORVASTATIN CALCIUM 80 MG PO TABS
80.0000 mg | ORAL_TABLET | Freq: Every day | ORAL | Status: DC
  Filled 2017-01-06: qty 1

## 2017-01-06 MED ORDER — ZOLPIDEM TARTRATE 5 MG PO TABS: 5.0000 mg | ORAL_TABLET | Freq: Every evening | ORAL | Status: DC | PRN

## 2017-01-06 MED ORDER — LIDOCAINE HCL (PF) 1 % IJ SOLN
INTRAMUSCULAR | Status: DC | PRN
  Administered 2017-01-06: 2 mL via SUBCUTANEOUS
  Administered 2017-01-06: 16 mL via SUBCUTANEOUS

## 2017-01-06 MED ORDER — FENTANYL CITRATE (PF) 100 MCG/2ML IJ SOLN
INTRAMUSCULAR | Status: AC
  Filled 2017-01-06: qty 2

## 2017-01-06 MED ORDER — MIDAZOLAM HCL 2 MG/2ML IJ SOLN
INTRAMUSCULAR | Status: AC
  Filled 2017-01-06: qty 2

## 2017-01-06 MED ORDER — ISOSORBIDE MONONITRATE ER 30 MG PO TB24
30.0000 mg | ORAL_TABLET | Freq: Every day | ORAL | Status: DC
  Administered 2017-01-06 – 2017-01-07 (×2): 30 mg via ORAL
  Filled 2017-01-06 (×2): qty 1

## 2017-01-06 MED ORDER — ASPIRIN 81 MG PO CHEW
81.0000 mg | CHEWABLE_TABLET | Freq: Every day | ORAL | Status: DC
  Administered 2017-01-07: 81 mg via ORAL
  Filled 2017-01-06: qty 1

## 2017-01-06 MED ORDER — HEPARIN (PORCINE) IN NACL 2-0.9 UNIT/ML-% IJ SOLN
INTRAMUSCULAR | Status: AC
  Filled 2017-01-06: qty 1000

## 2017-01-06 MED ORDER — SODIUM CHLORIDE 0.9 % WEIGHT BASED INFUSION: 1.0000 mL/kg/h | INTRAVENOUS | Status: DC

## 2017-01-06 MED ORDER — HEPARIN SODIUM (PORCINE) 1000 UNIT/ML IJ SOLN
INTRAMUSCULAR | Status: AC
  Filled 2017-01-06: qty 1

## 2017-01-06 MED ORDER — SODIUM CHLORIDE 0.9 % IV SOLN
INTRAVENOUS | Status: DC
  Administered 2017-01-06 – 2017-01-07 (×2): via INTRAVENOUS

## 2017-01-06 MED ORDER — ATROPINE SULFATE 1 MG/10ML IJ SOSY
PREFILLED_SYRINGE | INTRAMUSCULAR | Status: AC
  Filled 2017-01-06: qty 10

## 2017-01-06 MED ORDER — VERAPAMIL HCL 2.5 MG/ML IV SOLN
INTRAVENOUS | Status: AC
  Filled 2017-01-06: qty 2

## 2017-01-06 MED ORDER — SODIUM CHLORIDE 0.9% FLUSH: 3.0000 mL | INTRAVENOUS | Status: DC | PRN

## 2017-01-06 MED ORDER — MIDAZOLAM HCL 2 MG/2ML IJ SOLN
INTRAMUSCULAR | Status: DC | PRN
  Administered 2017-01-06: 2 mg via INTRAVENOUS
  Administered 2017-01-06: 1 mg via INTRAVENOUS

## 2017-01-06 MED ORDER — BIVALIRUDIN BOLUS VIA INFUSION - CUPID
INTRAVENOUS | Status: DC | PRN
  Administered 2017-01-06: 80.625 mg via INTRAVENOUS

## 2017-01-06 MED ORDER — IOPAMIDOL (ISOVUE-370) INJECTION 76%
INTRAVENOUS | Status: AC
  Filled 2017-01-06: qty 100

## 2017-01-06 MED ORDER — HYDRALAZINE HCL 20 MG/ML IJ SOLN: 5.0000 mg | INTRAMUSCULAR | Status: AC | PRN

## 2017-01-06 MED ORDER — TICAGRELOR 90 MG PO TABS
ORAL_TABLET | ORAL | Status: DC | PRN
  Administered 2017-01-06: 180 mg via ORAL

## 2017-01-06 MED ORDER — NITROGLYCERIN 1 MG/10 ML FOR IR/CATH LAB
INTRA_ARTERIAL | Status: AC
  Filled 2017-01-06: qty 10

## 2017-01-06 MED ORDER — BIVALIRUDIN TRIFLUOROACETATE 250 MG IV SOLR
INTRAVENOUS | Status: AC
  Filled 2017-01-06: qty 250

## 2017-01-06 MED ORDER — NITROGLYCERIN 1 MG/10 ML FOR IR/CATH LAB
INTRA_ARTERIAL | Status: DC | PRN
  Administered 2017-01-06 (×4): 200 ug via INTRACORONARY

## 2017-01-06 MED ORDER — SODIUM CHLORIDE 0.9% FLUSH
3.0000 mL | Freq: Two times a day (BID) | INTRAVENOUS | Status: DC
  Administered 2017-01-06: 21:00:00 3 mL via INTRAVENOUS

## 2017-01-06 MED ORDER — SODIUM CHLORIDE 0.9 % IV SOLN
INTRAVENOUS | Status: DC | PRN
  Administered 2017-01-06 (×2): 1.75 mg/kg/h via INTRAVENOUS

## 2017-01-06 MED ORDER — LIDOCAINE HCL 2 % IJ SOLN
INTRAMUSCULAR | Status: AC
  Filled 2017-01-06: qty 10

## 2017-01-06 MED ORDER — SODIUM CHLORIDE 0.9 % IV SOLN: 250.0000 mL | INTRAVENOUS | Status: DC | PRN

## 2017-01-06 MED ORDER — METOPROLOL SUCCINATE ER 25 MG PO TB24
25.0000 mg | ORAL_TABLET | Freq: Every day | ORAL | Status: DC
  Administered 2017-01-06 – 2017-01-07 (×2): 25 mg via ORAL
  Filled 2017-01-06 (×2): qty 1

## 2017-01-06 MED ORDER — SODIUM CHLORIDE 0.9 % WEIGHT BASED INFUSION
3.0000 mL/kg/h | INTRAVENOUS | Status: DC
  Administered 2017-01-06: 3 mL/kg/h via INTRAVENOUS

## 2017-01-06 MED ORDER — TICAGRELOR 90 MG PO TABS
ORAL_TABLET | ORAL | Status: AC
Start: 2017-01-06 — End: ?
  Filled 2017-01-06: qty 1

## 2017-01-06 MED ORDER — ATORVASTATIN CALCIUM 80 MG PO TABS
80.0000 mg | ORAL_TABLET | ORAL | Status: AC
  Administered 2017-01-06: 80 mg via ORAL
  Filled 2017-01-06: qty 1

## 2017-01-06 MED ORDER — TICAGRELOR 90 MG PO TABS
ORAL_TABLET | ORAL | Status: AC
  Filled 2017-01-06: qty 1

## 2017-01-06 MED ORDER — LABETALOL HCL 5 MG/ML IV SOLN
10.0000 mg | INTRAVENOUS | Status: AC | PRN
  Filled 2017-01-06: qty 4

## 2017-01-06 MED ORDER — HEPARIN (PORCINE) IN NACL 2-0.9 UNIT/ML-% IJ SOLN
INTRAMUSCULAR | Status: DC | PRN
  Administered 2017-01-06: 1000 mL

## 2017-01-06 MED ORDER — DIAZEPAM 5 MG PO TABS: 5.0000 mg | ORAL_TABLET | ORAL | Status: DC | PRN

## 2017-01-06 MED ORDER — TICAGRELOR 90 MG PO TABS
90.0000 mg | ORAL_TABLET | Freq: Two times a day (BID) | ORAL | Status: DC
  Administered 2017-01-07: 04:00:00 90 mg via ORAL
  Filled 2017-01-06: qty 1

## 2017-01-06 MED ORDER — LISINOPRIL 5 MG PO TABS
5.0000 mg | ORAL_TABLET | Freq: Every day | ORAL | Status: DC
  Administered 2017-01-06 – 2017-01-07 (×2): 5 mg via ORAL
  Filled 2017-01-06 (×2): qty 1

## 2017-01-06 MED ORDER — ACETAMINOPHEN 325 MG PO TABS: 650.0000 mg | ORAL_TABLET | ORAL | Status: DC | PRN

## 2017-01-06 MED ORDER — SODIUM CHLORIDE 0.9% FLUSH: 3.0000 mL | Freq: Two times a day (BID) | INTRAVENOUS | Status: DC

## 2017-01-06 MED ORDER — FENTANYL CITRATE (PF) 100 MCG/2ML IJ SOLN
INTRAMUSCULAR | Status: DC | PRN
  Administered 2017-01-06 (×2): 25 ug via INTRAVENOUS

## 2017-01-06 MED ORDER — ANGIOPLASTY BOOK
Freq: Once | Status: AC
  Administered 2017-01-06: 21:00:00
  Filled 2017-01-06: qty 1

## 2017-01-06 MED ORDER — ONDANSETRON HCL 4 MG/2ML IJ SOLN: 4.0000 mg | Freq: Four times a day (QID) | INTRAMUSCULAR | Status: DC | PRN

## 2017-01-06 MED ORDER — ASPIRIN 81 MG PO CHEW: 81.0000 mg | CHEWABLE_TABLET | ORAL | Status: DC

## 2017-01-06 SURGICAL SUPPLY — 23 items
BALLN EUPHORA RX 2.0X12 (BALLOONS) ×2
BALLN WOLVERINE 2.50X10 (BALLOONS) ×2
BALLN ~~LOC~~ EUPHORA RX 3.0X15 (BALLOONS) ×2
BALLOON EUPHORA RX 2.0X12 (BALLOONS) ×1 IMPLANT
BALLOON WOLVERINE 2.50X10 (BALLOONS) ×1 IMPLANT
BALLOON ~~LOC~~ EUPHORA RX 3.0X15 (BALLOONS) ×1 IMPLANT
CATH INFINITI 5FR MULTPACK ANG (CATHETERS) ×2 IMPLANT
CATH VISTA GUIDE 6FR XBLAD4 (CATHETERS) ×2 IMPLANT
GLIDESHEATH SLEND SS 6F .021 (SHEATH) ×2 IMPLANT
GUIDEWIRE INQWIRE 1.5J.035X260 (WIRE) ×1 IMPLANT
INQWIRE 1.5J .035X260CM (WIRE) ×2
KIT ENCORE 26 ADVANTAGE (KITS) ×2 IMPLANT
KIT HEART LEFT (KITS) ×2 IMPLANT
PACK CARDIAC CATHETERIZATION (CUSTOM PROCEDURE TRAY) ×2 IMPLANT
SHEATH PINNACLE 5F 10CM (SHEATH) ×2 IMPLANT
SHEATH PINNACLE 6F 10CM (SHEATH) ×2 IMPLANT
STENT RESOLUTE ONYX 2.75X18 (Permanent Stent) ×2 IMPLANT
SYR MEDRAD MARK V 150ML (SYRINGE) ×2 IMPLANT
TRANSDUCER W/STOPCOCK (MISCELLANEOUS) ×2 IMPLANT
TUBING CIL FLEX 10 FLL-RA (TUBING) ×2 IMPLANT
WIRE COUGAR XT STRL 190CM (WIRE) ×2 IMPLANT
WIRE EMERALD 3MM-J .035X150CM (WIRE) ×2 IMPLANT
WIRE PT2 MS 185 (WIRE) ×2 IMPLANT

## 2017-01-06 NOTE — H&P (View-Only) (Signed)
Cardiology Office Note  Date: 01/05/2017   ID: Christopher Nelson, Christopher Nelson [birth year], MRN [id number]  PCP: Rory Percy, MD  Consulting Cardiologist: Rozann Lesches, MD   Chief Complaint  Patient presents with  . Abnormal Myoview  . Chest Pain    History of Present Illness: Christopher Nelson is a 76 y.o. male referred for cardiology consultation by Dr. Ronne Binning at [facility] for evaluation of chest pain and recent abnormal Myoview. I reviewed the available records which are limited. He tells me that over the last 5 weeks or so he has been having increasing episodes of exertional chest aching associated with shortness of breath and fatigue, has to stop what he is doing to catch his breath. This culminated in a more intense episode a week ago Sunday that occurred at rest and woke him up and he was admitted overnight at [facility] where he ruled out for myocardial infarction with troponin T levels. He states he was discharged home and returned later that week on Friday (August 31) to undergo a exercise Myoview as an outpatient. He was then subsequently contacted about the abnormal results and this office visit was scheduled.  He is here today with his wife. He tells me that he continues to have intermittent exertional chest discomfort and shortness of breath consistent with angina although he has not been pushing himself recently. 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  I reviewed his medications. He tells me that he has been taking a baby aspirin daily for the last several days, also on Toprol-XL and lisinopril with history of hypertension.  He reports a history of heart murmur and had an echocardiogram done at [facility] a few weeks ago for follow-up. I do not have these results although on examination it sounds like he has mitral regurgitation. This is been followed by Dr. Nadara Mustard.  Today we went over  the stress test results and discussed the implications which point toward potentially significant ischemic heart disease in more than one vascular distribution. I recommended a diagnostic cardiac catheterization to assess for revascularization options, discussed the risks and benefits, and he is in agreement to proceed.  Past Medical History:  Diagnosis Date  . Essential hypertension   . Gout   . Nephrolithiasis     Past Surgical History:  Procedure Laterality Date  . CHOLECYSTECTOMY    . LITHOTRIPSY    . TONSILLECTOMY      Current Outpatient Prescriptions  Medication Sig Dispense Refill  . lisinopril (PRINIVIL,ZESTRIL) 5 MG tablet Take 5 mg by mouth daily.    . metoprolol succinate (TOPROL-XL) 25 MG 24 hr tablet Take 25 mg by mouth daily.    Marland Kitchen aspirin EC 81 MG tablet Take 1 tablet (81 mg total) by mouth daily. 30 tablet 3   No current facility-administered medications for this visit.    Allergies:  Patient has no known allergies.   Social History: The patient  reports that he has never smoked. He has never used smokeless tobacco. He reports that he does not drink alcohol or use drugs.   Family History: The patient's family history includes Diabetes Mellitus II in his sister; Heart attack in his brother; Pancreatic cancer in his sister; Uterine cancer in his mother.   ROS:  Please see the history of present illness. Otherwise, complete review of systems is positive for none.  All other systems are reviewed and negative.  Physical Exam: VS:  BP 120/72   Pulse 90   Ht 6\' 2"  (1.88 m)   Wt 237 lb (107.5 kg)   SpO2 96%   BMI 30.43 kg/m , BMI Body mass index is 30.43 kg/m.  Wt Readings from Last 3 Encounters:  01/05/17 237 lb (107.5 kg)    General: Tall stature, appears comfortable at rest. HEENT: Conjunctiva and lids normal, oropharynx clear with moist mucosa. Neck: Supple, no elevated JVP or carotid bruits, no thyromegaly. Lungs: Clear to auscultation, nonlabored  breathing at rest. Cardiac: Regular rate and rhythm, no S3, 3/6 apical systolic murmur, no pericardial rub. Abdomen: Soft, nontender, bowel sounds present, no guarding or rebound. Extremities: No pitting edema, distal pulses 2+. Skin: Warm and dry. Musculoskeletal: No kyphosis. Neuropsychiatric: Alert and oriented x3, affect grossly appropriate.  ECG: I personally reviewed the tracing from 12/26/2016 which showed sinus rhythm with left anterior fascicular block, decreased R wave progression, and occasional PVCs.  Recent Labwork:  August 2018: Hemoglobin 14.4, platelets 165, d-dimer 0.15, potassium 4.2, BUN 17, creatinine 1.1, troponin T less than 0.01, BNP 384, TSH 0.43  Other Studies Reviewed Today:  Exercise Myoview 12/31/2016 [facility]): Patient exercised for 5 minutes and 25 seconds on the Bruce protocol achieving a maximum workload of 7 METs. Reportedly PVCs and NSVT were noted but reportedly no diagnostic ST segment changes. Large area of reversibility involving the anterior and apical walls consistent with ischemia. Moderate-sized fixed defect in the inferior wall consistent with scar. Moderate hypokinesis of the inferior septum. LVEF calculated at 45%. Overall high risk study.  Assessment and Plan:  1. Symptoms consistent with accelerating angina over the last 5 weeks as discussed above. Based on the information I have at hand, patient ruled out for ACS during recent admission at [facility]. Subsequently obtained exercise Myoview as an outpatient is concerning for multivessel distribution ischemic heart disease and indicated LVEF of 45%. He also was described to have had PVCs and NSVT on treadmill testing. We have discussed proceeding to a diagnostic cardiac catheterization to evaluate coronary anatomy for revascularization options. After reviewing the risks and benefits, he is in agreement to proceed. This is being scheduled within the next 24 hours. He  will continue an aspirin daily, beta blocker, and ACE inhibitor. We will request full set of lab work and chest x-ray done recently at [facility], and also the echocardiogram done a few weeks ago by report.  2. Reportedly long-standing heart murmur, on examination suggestive of mitral regurgitation. Requesting recent echocardiogram that was obtained by Dr. Nadara Mustard.  3. Essential hypertension, on Toprol-XL and lisinopril.  4. Uncertain lipid status. Will give empiric statin for cardiac catheterization.  Current medicines were reviewed with the patient today.  Disposition: Follow-up after cardiac catheterization.  Signed, Satira Sark, MD, [facility] 01/05/2017 11:17 AM    [facility] [place], [place], [place] [address] Phone: [phone]; Fax: [phone]

## 2017-01-06 NOTE — Research (Signed)
OPTIMIZE Research Study Informed Consent   Subject Name: Christopher Nelson  Subject met inclusion and exclusion criteria.  The informed consent form, study requirements and expectations were reviewed with the subject and questions and concerns were addressed prior to the signing of the consent form.  The subject verbalized understanding of the trial requirements.  The subject agreed to participate in the OPTIMIZE trial and signed the informed consent.  The informed consent was obtained prior to performance of any protocol-specific procedures for the subject.  A copy of the signed informed consent was given to the subject and a copy was placed in the subject's medical record. Angiographic criteria must be met in order for the subject to be enrolled.  Blossom Hoops 01/06/2017, 2:40 PM

## 2017-01-06 NOTE — Progress Notes (Signed)
Site area: right groin  Site Prior to Removal:  Level 0  Pressure Applied For 40 MINUTES    Minutes Beginning at 2110  Manual:   Yes.    Patient Status During Pull:  Stable   Post Pull Groin Site:  Level 0  Post Pull Instructions Given:  Yes.    Post Pull Pulses Present:  Yes.    Dressing Applied:  Yes.    Comments:  Groin site teaching reinforced with patient and spouse. Verbalized understanding.

## 2017-01-06 NOTE — Interval H&P Note (Signed)
Cath Lab Visit (complete for each Cath Lab visit)  Clinical Evaluation Leading to the Procedure:   ACS: No.  Non-ACS:    Anginal Classification: CCS III  Anti-ischemic medical therapy: Minimal Therapy (1 class of medications)  Non-Invasive Test Results: High-risk stress test findings: cardiac mortality >3%/year  Prior CABG: No previous CABG      History and Physical Interval Note:  01/06/2017 3:18 PM  Christopher Nelson  has presented today for surgery, with the diagnosis of angina - abnormal mioview  The various methods of treatment have been discussed with the patient and family. After consideration of risks, benefits and other options for treatment, the patient has consented to  Procedure(s): LEFT HEART CATH AND CORONARY ANGIOGRAPHY (N/A) as a surgical intervention .  The patient's history has been reviewed, patient examined, no change in status, stable for surgery.  I have reviewed the patient's chart and labs.  Questions were answered to the patient's satisfaction.     Shelva Majestic

## 2017-01-07 ENCOUNTER — Telehealth: Payer: Self-pay | Admitting: Cardiology

## 2017-01-07 ENCOUNTER — Encounter (HOSPITAL_COMMUNITY): Payer: Self-pay | Admitting: Cardiovascular Disease

## 2017-01-07 DIAGNOSIS — M109 Gout, unspecified: Secondary | ICD-10-CM | POA: Diagnosis not present

## 2017-01-07 DIAGNOSIS — E785 Hyperlipidemia, unspecified: Secondary | ICD-10-CM

## 2017-01-07 DIAGNOSIS — I5021 Acute systolic (congestive) heart failure: Secondary | ICD-10-CM

## 2017-01-07 DIAGNOSIS — I25110 Atherosclerotic heart disease of native coronary artery with unstable angina pectoris: Secondary | ICD-10-CM | POA: Diagnosis not present

## 2017-01-07 DIAGNOSIS — I2584 Coronary atherosclerosis due to calcified coronary lesion: Secondary | ICD-10-CM | POA: Diagnosis not present

## 2017-01-07 DIAGNOSIS — I2582 Chronic total occlusion of coronary artery: Secondary | ICD-10-CM | POA: Diagnosis not present

## 2017-01-07 DIAGNOSIS — R011 Cardiac murmur, unspecified: Secondary | ICD-10-CM | POA: Diagnosis not present

## 2017-01-07 DIAGNOSIS — I110 Hypertensive heart disease with heart failure: Secondary | ICD-10-CM | POA: Diagnosis not present

## 2017-01-07 DIAGNOSIS — Z7982 Long term (current) use of aspirin: Secondary | ICD-10-CM | POA: Diagnosis not present

## 2017-01-07 DIAGNOSIS — R9439 Abnormal result of other cardiovascular function study: Secondary | ICD-10-CM

## 2017-01-07 LAB — CBC
HCT: 36.6 % — ABNORMAL LOW (ref 39.0–52.0)
Hemoglobin: 12.1 g/dL — ABNORMAL LOW (ref 13.0–17.0)
MCH: 29.3 pg (ref 26.0–34.0)
MCHC: 33.1 g/dL (ref 30.0–36.0)
MCV: 88.6 fL (ref 78.0–100.0)
PLATELETS: 172 10*3/uL (ref 150–400)
RBC: 4.13 MIL/uL — ABNORMAL LOW (ref 4.22–5.81)
RDW: 13.8 % (ref 11.5–15.5)
WBC: 8.9 10*3/uL (ref 4.0–10.5)

## 2017-01-07 LAB — BASIC METABOLIC PANEL
Anion gap: 8 (ref 5–15)
BUN: 19 mg/dL (ref 6–20)
CALCIUM: 9 mg/dL (ref 8.9–10.3)
CO2: 25 mmol/L (ref 22–32)
CREATININE: 1.26 mg/dL — AB (ref 0.61–1.24)
Chloride: 103 mmol/L (ref 101–111)
GFR calc non Af Amer: 54 mL/min — ABNORMAL LOW (ref >60)
Glucose, Bld: 175 mg/dL — ABNORMAL HIGH (ref 65–99)
Potassium: 4 mmol/L (ref 3.5–5.1)
Sodium: 136 mmol/L (ref 135–145)

## 2017-01-07 MED ORDER — TICAGRELOR 90 MG PO TABS: 90.0000 mg | ORAL_TABLET | Freq: Two times a day (BID) | ORAL | 0 refills | Status: DC

## 2017-01-07 MED ORDER — TICAGRELOR 90 MG PO TABS: 90.0000 mg | ORAL_TABLET | Freq: Two times a day (BID) | ORAL | 11 refills | Status: DC

## 2017-01-07 MED ORDER — CLOPIDOGREL BISULFATE 75 MG PO TABS: 75.0000 mg | ORAL_TABLET | Freq: Every day | ORAL | 2 refills | Status: DC

## 2017-01-07 MED ORDER — METOPROLOL SUCCINATE ER 25 MG PO TB24: 25.0000 mg | ORAL_TABLET | Freq: Every day | ORAL | 1 refills | Status: DC

## 2017-01-07 MED ORDER — CLOPIDOGREL BISULFATE 75 MG PO TABS: 75.0000 mg | ORAL_TABLET | Freq: Every day | ORAL | Status: DC

## 2017-01-07 MED ORDER — CLOPIDOGREL BISULFATE 75 MG PO TABS
300.0000 mg | ORAL_TABLET | Freq: Once | ORAL | Status: AC
  Administered 2017-01-07: 300 mg via ORAL
  Filled 2017-01-07: qty 4

## 2017-01-07 MED FILL — Lidocaine HCl Local Inj 2%: INTRAMUSCULAR | Qty: 20 | Status: AC

## 2017-01-07 MED FILL — Verapamil HCl IV Soln 2.5 MG/ML: INTRAVENOUS | Qty: 2 | Status: AC

## 2017-01-07 NOTE — Progress Notes (Signed)
CARDIAC REHAB PHASE I   PRE:  Rate/Rhythm: 72 SR  BP:  Sitting: 123/60    MODE:  Ambulation: 500 ft   POST:  Rate/Rhythm: 85 SR  BP:  Sitting: 125/69         SaO2: 98 RA  Pt ambulated 500 ft on RA, IV, handheld assist, steady gait, tolerated well with no complaints other than shortness of breath intermittently, likely related to brilinta. Completed PCI/stent education with pt and wife at bedside.  Reviewed risk factors, PCI book, anti-platelet therapy, stent card, activity restrictions, ntg, exercise, heart healthy diet and phase 2 cardiac rehab. Pt and wife verbalized understanding. Pt agrees to phase 2 cardiac rehab referral, will send to [facility] per pt request. Pt to bed per pt request after walk, call bell within reach.  [id number] Lenna Sciara, RN, BSN 01/07/2017 9:27 AM

## 2017-01-07 NOTE — Telephone Encounter (Signed)
Patient is requesting oxygen tank for "couple days'. States that he can not sleep and when put on oxygen at the hospital he was able to. / tg

## 2017-01-07 NOTE — Telephone Encounter (Signed)
I encouraged patient to call his pcp and he will do that.

## 2017-01-07 NOTE — Progress Notes (Addendum)
CARDIOLOGY  Digital images were reviewed. The patient has significant coronary disease with ostial occlusion of the LAD, minimal to moderate collateralization of the apical LAD from the right coronary, high-grade obstruction in the third left ventricular branch of the right coronary, and high-grade obstruction in a branch of the ramus intermedius. The ramus was treated with stent implantation with a beautiful result. Patient has significant LV dysfunction, ischemically mediated.  No symptoms overnight other than dyspnea possibly related to Brilinta.  Both the right radial and right femoral access sites are unremarkable. Cardiac exam is unremarkable.  Plan: Discharge today back to the care of Dr. Domenic Polite. Metoprolol succinate 25 mg per day was added and probably needs to be optimized. Hyzaar will be continued. The ARB component may need to be further optimized for preservation of LV function. Aspirin and Brilinta should be used for one year. If dyspnea is a problem I would switch the patient to Plavix. Distal right coronary disease is best treated with medical therapy. Long-acting nitrates can be added if needed in the future.

## 2017-01-07 NOTE — Progress Notes (Signed)
Pt and wife given all discharge instructions and verbalized understanding.  Pt and wife is aware of 2 prescriptions to be picked up at [facility] in [place]. Pt is not having any chest pain or sob at this time. Rt radial and rt groin sites are wnl.  Discharged home via wc with wife and all belongings.

## 2017-01-07 NOTE — Care Management Note (Signed)
Case Management Note  Patient Details  Name: MARSHEL GOLUBSKI MRN: [id number] Date of Birth: 1940/08/07  Subjective/Objective:   From home,pta indep, s/p coronary stent intervention, will be on plavix.  Per RN, MD changed from brilinta to plavix.  He is for dc today,no needs.                 Action/Plan: NCM will follow for dc needs.  Expected Discharge Date:  01/07/17               Expected Discharge Plan:  Home/Self Care  In-House Referral:     Discharge planning Services  CM Consult  Post Acute Care Choice:    Choice offered to:     DME Arranged:    DME Agency:     HH Arranged:    HH Agency:     Status of Service:  Completed, signed off  If discussed at [employer] of Stay Meetings, dates discussed:    Additional Comments:  Khanh, Tanori, RN 01/07/2017, 9:38 AM

## 2017-01-07 NOTE — Discharge Summary (Signed)
Discharge Summary    Patient ID: Christopher Nelson,  MRN: [id number], DOB/AGE: [birth year] 76 y.o.  Admit date: 01/06/2017 Discharge date: 01/07/2017  Primary Care Provider: Rory Percy Primary Cardiologist: Domenic Polite  Discharge Diagnoses    Active Problems:   Coronary artery disease involving native coronary artery of native heart with unstable angina pectoris [facility])   Abnormal myocardial perfusion study   Acute systolic heart failure ([place])   Hyperlipidemia   Allergies No Known Allergies  Diagnostic Studies/Procedures    LHC: 01/06/17  Conclusion     Ost LAD lesion, 100 %stenosed.  2nd RPLB lesion, 90 %stenosed.  A STENT RESOLUTE ONYX [id number] drug eluting stent was successfully placed.  Lat Ramus lesion, 99 %stenosed.  Post intervention, there is a 0% residual stenosis.  The left ventricular ejection fraction is 35-45% by visual estimate.  There is moderate left ventricular systolic dysfunction.  LV end diastolic pressure is normal.  Ost LAD to Prox LAD lesion, 100 %stenosed.   Multivessel CAD with total ostial occlusion of the LAD; large ramus intermediate vessel which bifurcates into a twin like vessel with 99% ostial stenosis in this diagonal like branch of the ramus; irregularity of the OM1 vessel without high-grade stenosis, and calcification diffusely in the RCA with 20% proximal narrowing and luminal irregularities throughout with 90%  PL stenosis in a small distal vessel.  There is collateralization to the distal to mid LAD via the distal RCA.  Moderate LV dysfunction with hypocontractility involving the anterolateral wall with an EF of 35-40%.  Successful PCI to the twin like branch of a large ramus intermediate vessel, treated with PTCA, cutting balloon, and DES stenting with ultimate insertion of a 2.7518 mm Resolute Onyx DES stent postdilated to 3.0 with the 99% stenosis being reduced to 0%.  RECOMMENDATION: DAPT therapy for a minimum of a year.   Anti-ischemic medical therapy for concomitant CAD.  Hypotency statin therapy.  The patient will follow-up with Dr. Domenic Polite   _____________   History of Present Illness     Christopher Nelson is a 76 y.o. male referred for cardiology consultation by Dr. Ronne Binning at [facility] for evaluation of chest pain and recent abnormal Myoview. He reported that over the last 5 weeks or so he had been having increasing episodes of exertional chest aching associated with shortness of breath and fatigue, has to stop what he is doing to catch his breath. This culminated in a more intense episode a week ago Sunday that occurred at rest and woke him up and he was admitted overnight at [facility] where he ruled out for myocardial infarction with troponin levels. He stated he was discharged home and returned later that week on Friday (August 31) to undergo a exercise Myoview as an outpatient. He was then subsequently contacted about the abnormal results and then scheduled to be seen in follow up in the office.   At his office visit he continued to have intermittent exertional chest discomfort and shortness of breath consistent with angina although he has not been pushing himself recently. 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 He lives in [place].  Reported he was taking a baby aspirin daily for the last several days, also on Toprol-XL and lisinopril with history of hypertension.  He reported a history of heart murmur and had an echocardiogram done at [facility] a few weeks ago for follow-up.  At this office visit they went over  the stress test results and discussed the implications which point toward potentially significant ischemic heart disease in more than one vascular distribution. Recommended a diagnostic cardiac catheterization to assess for revascularization options, discussed the risks and benefits, and he was in agreement to  proceed.  Hospital Course     Underwent cardiac cath with Dr. Claiborne Billings noted about with successful PCI/DES and cutting balloon to twin branch of the ramus intermediate. Also noted a total occlusion of the ost LAD with collateralization. Plan was for Brilinta originally but patient reported dyspnea and felt he could not tolerate. Therefore plan for DAPT with ASA/Plavix for at least one year. Post cath labs showed stable Cr 1.26 and Hgb 12.1. EF noted at 35-45% via LV gram. No complications noted post cath. Worked well with cardiac rehab.  General: Well developed, well nourished, male appearing in no acute distress. Head: Normocephalic, atraumatic.  Neck: Supple . Lungs:  Resp regular and unlabored, CTA. Heart: RRR, S1, S2, no S3, S4, or murmur; no rub. Abdomen: Soft, non-tender, non-distended with normoactive bowel sounds. Extremities: No clubbing, cyanosis, edema. Distal pedal pulses are 2+ bilaterally. R radial/groin cath site stable without bruising or hematoma Neuro: Alert and oriented X 3. Moves all extremities spontaneously. Psych: Normal affect.  Gatha Mayer was seen by Dr. Tamala Julian and determined stable for discharge home. Follow up in the office has been arranged. Medications are listed below.  _____________  Discharge Vitals Blood pressure 125/69, pulse 80, temperature 98.1 F (36.7 C), temperature source Oral, resp. rate 16, height 6\' 2"  (1.88 m), weight 233 lb 11 oz (106 kg), SpO2 98 %.  Filed Weights   01/06/17 1310 01/07/17 0100  Weight: 237 lb (107.5 kg) 233 lb 11 oz (106 kg)    Labs & Radiologic Studies    CBC  Recent Labs  01/06/17 1345 01/07/17 0134  WBC 9.4 8.9  HGB 13.8 12.1*  HCT 40.7 36.6*  MCV 87.5 88.6  PLT 184 809   Basic Metabolic Panel  Recent Labs  01/06/17 1345 01/07/17 0134  NA 138 136  K 4.1 4.0  CL 104 103  CO2 25 25  GLUCOSE 129* 175*  BUN 19 19  CREATININE 1.21 1.26*  CALCIUM 9.6 9.0   Liver Function Tests No results for  input(s): AST, ALT, ALKPHOS, BILITOT, PROT, ALBUMIN in the last 72 hours. No results for input(s): LIPASE, AMYLASE in the last 72 hours. Cardiac Enzymes No results for input(s): CKTOTAL, CKMB, CKMBINDEX, TROPONINI in the last 72 hours. BNP Invalid input(s): POCBNP D-Dimer No results for input(s): DDIMER in the last 72 hours. Hemoglobin A1C No results for input(s): HGBA1C in the last 72 hours. Fasting Lipid Panel No results for input(s): CHOL, HDL, LDLCALC, TRIG, CHOLHDL, LDLDIRECT in the last 72 hours. Thyroid Function Tests No results for input(s): TSH, T4TOTAL, T3FREE, THYROIDAB in the last 72 hours.  Invalid input(s): FREET3 _____________  No results found. Disposition   Pt is being discharged home today in good condition.  Follow-up Plans & Appointments    Follow-up Information    Satira Sark, MD Follow up on 01/17/2017.   Specialty:  Cardiology Why:  at 1:40pm for your follow up appt.  Contact information: [place] [id number] [phone]          Discharge Instructions    Diet - low sodium heart healthy    Complete by:  As directed    Discharge instructions    Complete by:  As directed  Groin Site Care Refer to this sheet in the next few weeks. These instructions provide you with information on caring for yourself after your procedure. Your caregiver may also give you more specific instructions. Your treatment has been planned according to current medical practices, but problems sometimes occur. Call your caregiver if you have any problems or questions after your procedure. HOME CARE INSTRUCTIONS You may shower 24 hours after the procedure. Remove the bandage (dressing) and gently wash the site with plain soap and water. Gently pat the site dry.  Do not apply powder or lotion to the site.  Do not sit in a bathtub, swimming pool, or whirlpool for 5 to 7 days.  No bending, squatting, or lifting anything over 10 pounds (4.5 kg) as  directed by your caregiver.  Inspect the site at least twice daily.  Do not drive home if you are discharged the same day of the procedure. Have someone else drive you.  You may drive 24 hours after the procedure unless otherwise instructed by your caregiver.  What to expect: Any bruising will usually fade within 1 to 2 weeks.  Blood that collects in the tissue (hematoma) may be painful to the touch. It should usually decrease in size and tenderness within 1 to 2 weeks.  SEEK IMMEDIATE MEDICAL CARE IF: You have unusual pain at the groin site or down the affected leg.  You have redness, warmth, swelling, or pain at the groin site.  You have drainage (other than a small amount of blood on the dressing).  You have chills.  You have a fever or persistent symptoms for more than 72 hours.  You have a fever and your symptoms suddenly get worse.  Your leg becomes pale, cool, tingly, or numb.  You have heavy bleeding from the site. Hold pressure on the site. .  Radial Site Care Refer to this sheet in the next few weeks. These instructions provide you with information on caring for yourself after your procedure. Your caregiver may also give you more specific instructions. Your treatment has been planned according to current medical practices, but problems sometimes occur. Call your caregiver if you have any problems or questions after your procedure. HOME CARE INSTRUCTIONS You may shower the day after the procedure.Remove the bandage (dressing) and gently wash the site with plain soap and water.Gently pat the site dry.  Do not apply powder or lotion to the site.  Do not submerge the affected site in water for 3 to 5 days.  Inspect the site at least twice daily.  Do not flex or bend the affected arm for 24 hours.  No lifting over 5 pounds (2.3 kg) for 5 days after your procedure.  Do not drive home if you are discharged the same day of the procedure. Have someone else drive you.  You may drive 24  hours after the procedure unless otherwise instructed by your caregiver.  What to expect: Any bruising will usually fade within 1 to 2 weeks.  Blood that collects in the tissue (hematoma) may be painful to the touch. It should usually decrease in size and tenderness within 1 to 2 weeks.  SEEK IMMEDIATE MEDICAL CARE IF: You have unusual pain at the radial site.  You have redness, warmth, swelling, or pain at the radial site.  You have drainage (other than a small amount of blood on the dressing).  You have chills.  You have a fever or persistent symptoms for more than 72 hours.  You have  a fever and your symptoms suddenly get worse.  Your arm becomes pale, cool, tingly, or numb.  You have heavy bleeding from the site. Hold pressure on the site.   PLEASE DO NOT MISS ANY DOSES OF YOUR BRILINTA!!!!! Also keep a log of you blood pressures and bring back to your follow up appt. Please call the office with any questions.   Patients taking blood thinners should generally stay away from medicines like ibuprofen, Advil, Motrin, naproxen, and Aleve due to risk of stomach bleeding. You may take Tylenol as directed or talk to your primary doctor about alternatives.  Some studies suggest Prilosec/Omeprazole interacts with Plavix. We changed your Prilosec/Omeprazole to the equivalent dose of Protonix for less chance of interaction.   Increase activity slowly    Complete by:  As directed       Discharge Medications     Medication List    TAKE these medications   acetaminophen 325 MG tablet Commonly known as:  TYLENOL Take 650 mg by mouth 2 (two) times daily.   aspirin EC 81 MG tablet Take 1 tablet (81 mg total) by mouth daily.   atorvastatin 40 MG tablet Commonly known as:  LIPITOR Take 40 mg by mouth daily.   b complex vitamins tablet Take 1 tablet by mouth daily.   clopidogrel 75 MG tablet Commonly known as:  PLAVIX Take 1 tablet (75 mg total) by mouth daily.   COQ-10 PO Take 1  tablet by mouth daily.   losartan-hydrochlorothiazide 50-12.5 MG tablet Commonly known as:  HYZAAR Take 0.5 tablets by mouth daily.   MAGNESIUM PO Take 1 tablet by mouth daily.   metoprolol succinate 25 MG 24 hr tablet Commonly known as:  TOPROL-XL Take 1 tablet (25 mg total) by mouth daily.   nitroGLYCERIN 0.4 MG SL tablet Commonly known as:  NITROSTAT Place 0.4 mg under the tongue every 5 (five) minutes as needed for chest pain.   ranitidine 150 MG tablet Commonly known as:  ZANTAC Take 150 mg by mouth 2 (two) times daily.   triamcinolone cream 0.1 % Commonly known as:  KENALOG Apply 1 application topically daily as needed (rash).   VITAMIN C PO Take 1 tablet by mouth daily.        Aspirin prescribed at discharge?  Yes High Intensity Statin Prescribed? (Lipitor 40-80mg  or Crestor 20-40mg ): Yes Beta Blocker Prescribed? Yes For EF <40%, was ACEI/ARB Prescribed? Yes ADP Receptor Inhibitor Prescribed? (i.e. Plavix etc.-Includes Medically Managed Patients): Yes For EF <40%, Aldosterone Inhibitor Prescribed? No: Consider adding at outpatient follow up Was EF assessed during THIS hospitalization? Yes Was Cardiac Rehab II ordered? (Included Medically managed Patients): Yes   Outstanding Labs/Studies   FLP/LFTs in 6 weeks if tolerating statin.   Duration of Discharge Encounter   Greater than 30 minutes including physician time.  Signed, Reino Bellis NP-C 01/07/2017, 9:25 AM   The patient has been seen in conjunction with Reino Bellis, [facility]. All aspects of care have been considered and discussed. The patient has been personally interviewed, examined, and all clinical data has been reviewed.   Digital images were reviewed. The patient has significant coronary disease with ostial occlusion of the LAD, minimal to moderate collateralization of the apical LAD from the right coronary, high-grade obstruction in the third left ventricular branch of the right coronary, and  high-grade obstruction in a branch of the ramus intermedius. The ramus was treated with stent implantation with a beautiful result. Patient has significant LV dysfunction, ischemically mediated.  No symptoms overnight other than dyspnea possibly related to Brilinta.  Both the right radial and right femoral access sites are unremarkable. Cardiac exam is unremarkable.  Plan: Discharge today back to the care of Dr. Domenic Polite. Metoprolol succinate 25 mg per day was added and probably needs to be optimized. Hyzaar will be continued. The ARB component may need to be further optimized for preservation of LV function. Aspirin and Brilinta should be used for one year. If dyspnea is a problem I would switch the patient to Plavix. Distal right coronary disease is best treated with medical therapy. Long-acting nitrates can be added if needed in the future.

## 2017-01-17 ENCOUNTER — Ambulatory Visit (INDEPENDENT_AMBULATORY_CARE_PROVIDER_SITE_OTHER): Payer: Medicare Other | Admitting: Cardiology

## 2017-01-17 ENCOUNTER — Encounter: Payer: Self-pay | Admitting: Cardiology

## 2017-01-17 VITALS — BP 150/87 | HR 79 | Ht 74.0 in | Wt 232.0 lb

## 2017-01-17 DIAGNOSIS — I255 Ischemic cardiomyopathy: Secondary | ICD-10-CM

## 2017-01-17 DIAGNOSIS — I25119 Atherosclerotic heart disease of native coronary artery with unspecified angina pectoris: Secondary | ICD-10-CM | POA: Diagnosis not present

## 2017-01-17 DIAGNOSIS — I209 Angina pectoris, unspecified: Secondary | ICD-10-CM

## 2017-01-17 DIAGNOSIS — I10 Essential (primary) hypertension: Secondary | ICD-10-CM | POA: Diagnosis not present

## 2017-01-17 DIAGNOSIS — I200 Unstable angina: Secondary | ICD-10-CM

## 2017-01-17 DIAGNOSIS — E782 Mixed hyperlipidemia: Secondary | ICD-10-CM | POA: Diagnosis not present

## 2017-01-17 NOTE — Progress Notes (Signed)
Cardiology Office Note  Date: 01/17/2017   ID: Christopher, Nelson 06-04-1940, MRN [id number]  PCP: Christopher Percy, MD  Primary Cardiologist: Christopher Lesches, MD   Chief Complaint  Patient presents with  . Coronary Artery Disease    History of Present Illness: Christopher Nelson is a 75 y.o. male that I met recently in consultation on September 5 and referred for cardiac catheterization in the setting of accelerating angina and high risk Myoview done at [facility]. Procedure was performed with Dr. Claiborne Billings on September 6 with finding of multivessel CAD including ostial LAD occlusion associated with collaterals from the distal RCA, severe stenosis within a large ramus intermedius branch, mild irregularities in the obtuse marginal system, and diffuse mild stenosis within the RCA with a 90% posterolateral stenosis and a small vessel segment. He underwent placement of DES in the ramus intermedius and was otherwise managed medically. LVEF reported to be 35-40% at angiography. Records from [facility] did not include any recent echocardiogram.  He presents today with his wife for a follow-up visit. States that he feels better overall. We went over the results of his recent procedure. Also discussed referral for cardiac rehabilitation at [place] where he lives.  We went over his medications. He reports compliance and no active bleeding problems on aspirin and Plavix.  Past Medical History:  Diagnosis Date  . Coronary artery disease   . Essential hypertension   . GERD (gastroesophageal reflux disease)   . Gout   . Heart murmur   . History of kidney stones   . Pre-diabetes     Past Surgical History:  Procedure Laterality Date  . CARDIAC CATHETERIZATION  01/06/2017  . CHOLECYSTECTOMY    . CORONARY STENT INTERVENTION  01/06/2017   CORONARY STENT INTERVENTION  . CORONARY STENT INTERVENTION N/A 01/06/2017   Procedure: CORONARY STENT INTERVENTION;  Surgeon:  Troy Sine, MD;  Location: [place] [facility];  Service: Cardiovascular;  Laterality: N/A;  . LEFT HEART CATH AND CORONARY ANGIOGRAPHY N/A 01/06/2017   Procedure: LEFT HEART CATH AND CORONARY ANGIOGRAPHY;  Surgeon: Troy Sine, MD;  Location: [place] [facility];  Service: Cardiovascular;  Laterality: N/A;  . LITHOTRIPSY    . TONSILLECTOMY      Current Outpatient Prescriptions  Medication Sig Dispense Refill  . acetaminophen (TYLENOL) 325 MG tablet Take 650 mg by mouth 2 (two) times daily.    . Ascorbic Acid (VITAMIN C PO) Take 1 tablet by mouth daily.    Marland Kitchen aspirin EC 81 MG tablet Take 1 tablet (81 mg total) by mouth daily. 30 tablet 3  . atorvastatin (LIPITOR) 40 MG tablet Take 40 mg by mouth daily.    Marland Kitchen b complex vitamins tablet Take 1 tablet by mouth daily.    . clopidogrel (PLAVIX) 75 MG tablet Take 1 tablet (75 mg total) by mouth daily. 90 tablet 2  . Coenzyme Q10 (COQ-10 PO) Take 1 tablet by mouth daily.    Marland Kitchen losartan-hydrochlorothiazide (HYZAAR) 50-12.5 MG tablet Take 0.5 tablets by mouth daily.    Marland Kitchen MAGNESIUM PO Take 1 tablet by mouth daily.    . metoprolol succinate (TOPROL-XL) 25 MG 24 hr tablet Take 1 tablet (25 mg total) by mouth daily. 90 tablet 1  . nitroGLYCERIN (NITROSTAT) 0.4 MG SL tablet Place 0.4 mg under the tongue every 5 (five) minutes as needed for chest pain.    . ranitidine (ZANTAC) 150 MG tablet Take 150 mg by mouth  2 (two) times daily.     Marland Kitchen triamcinolone cream (KENALOG) 0.1 % Apply 1 application topically daily as needed (rash).      No current facility-administered medications for this visit.    Allergies:  Patient has no known allergies.   Social History: The patient  reports that he has never smoked. He has never used smokeless tobacco. He reports that he does not drink alcohol or use drugs.   ROS:  Please see the history of present illness. Otherwise, complete review of systems is positive for none.  All other systems are reviewed and negative.    Physical Exam: VS:  BP (!) 150/87   Pulse 79   Ht '6\' 2"'  (1.88 m)   Wt 232 lb (105.2 kg)   SpO2 96%   BMI 29.79 kg/m , BMI Body mass index is 29.79 kg/m.  Wt Readings from Last 3 Encounters:  01/17/17 232 lb (105.2 kg)  01/07/17 233 lb 11 oz (106 kg)  01/05/17 237 lb (107.5 kg)    General: Tall elderly male, appears comfortable at rest. HEENT: Conjunctiva and lids normal, oropharynx clear with moist mucosa. Neck: Supple, no elevated JVP or carotid bruits, no thyromegaly. Lungs: Clear to auscultation, nonlabored breathing at rest. Cardiac: Regular rate and rhythm, no S3, 3/6 apical systolic murmur, no pericardial rub. Abdomen: Soft, nontender, bowel sounds present, no guarding or rebound. Extremities: No pitting edema, distal pulses 2+. Skin: Warm and dry. Musculoskeletal: No kyphosis. Neuropsychiatric: Alert and oriented x3, affect grossly appropriate.  ECG: I personally reviewed the tracing from 01/07/2017 which showed sinus rhythm with prolonged PR interval, IVCD, nonspecific T-wave changes.  Recent Labwork: 01/07/2017: BUN 19; Creatinine, Ser 1.26; Hemoglobin 12.1; Platelets 172; Potassium 4.0; Sodium 136   Other Studies Reviewed Today:  Cardiac catheterization and PCI 01/06/2017:  Ost LAD lesion, 100 %stenosed.  2nd RPLB lesion, 90 %stenosed.  A STENT RESOLUTE ONYX [id number] drug eluting stent was successfully placed.  Lat Ramus lesion, 99 %stenosed.  Post intervention, there is a 0% residual stenosis.  The left ventricular ejection fraction is 35-45% by visual estimate.  There is moderate left ventricular systolic dysfunction.  LV end diastolic pressure is normal.  Ost LAD to Prox LAD lesion, 100 %stenosed.   Multivessel CAD with total ostial occlusion of the LAD; large ramus intermediate vessel which bifurcates into a twin like vessel with 99% ostial stenosis in this diagonal like branch of the ramus; irregularity of the OM1 vessel without high-grade stenosis,  and calcification diffusely in the RCA with 20% proximal narrowing and luminal irregularities throughout with 90%  PL stenosis in a small distal vessel.  There is collateralization to the distal to mid LAD via the distal RCA.  Moderate LV dysfunction with hypocontractility involving the anterolateral wall with an EF of 35-40%.  Successful PCI to the twin like branch of a large ramus intermediate vessel, treated with PTCA, cutting balloon, and DES stenting with ultimate insertion of a 2.7518 mm Resolute Onyx DES stent postdilated to 3.0 with the 99% stenosis being reduced to 0%.  Assessment and Plan:  1. Multivessel CAD with angina symptoms, improved after recent intervention, specifically DES to a large branching ramus intermedius. Residual disease includes ostial occlusion of the LAD with right-to-left collaterals, also 90% posterolateral stenosis and small vessel segment. Plan will be to continue medical therapy including dual antiplatelet regimen. Referral made to cardiac rehabilitation in [place].  2. Ischemic cardiomyopathy, LVEF 35-40% at angiography. Plan will be to obtain an echocardiogram for further  evaluation. This will be arranged in the [place] office.  3. Currently on Lipitor 40 mg daily, plan to assess lipid panel and LFTs around the time of his next visit.  4. Essential hypertension, no changes made to present regimen. Follow blood pressure trend and titrate as appropriate.  Current medicines were reviewed with the patient today.   Orders Placed This Encounter  Procedures  . ECHOCARDIOGRAM COMPLETE    Disposition: Follow-up in 2 months.  Signed, Satira Sark, MD, [facility] 01/17/2017 2:08 PM    [facility] at [facility] [address] [place], [place] [address] Phone: [phone]; Fax: [phone]

## 2017-01-17 NOTE — Patient Instructions (Addendum)
Your physician recommends that you schedule a follow-up appointment in: 2 months with Dr.McDowell in the [place] office.    Your physician has requested that you have an echocardiogram at the [facility] office. Echocardiography is a painless test that uses sound waves to create images of your heart. It provides your doctor with information about the size and shape of your heart and how well your heart's chambers and valves are working. This procedure takes approximately one hour. There are no restrictions for this procedure.    You have been referred to Cardiac Rehab in [place], expect a call from them.      No lab work ordered today.     Thank you for choosing [facility] !

## 2017-01-26 ENCOUNTER — Ambulatory Visit (INDEPENDENT_AMBULATORY_CARE_PROVIDER_SITE_OTHER): Payer: Medicare Other

## 2017-01-26 ENCOUNTER — Other Ambulatory Visit: Payer: Self-pay

## 2017-01-26 DIAGNOSIS — R05 Cough: Secondary | ICD-10-CM | POA: Diagnosis not present

## 2017-01-26 DIAGNOSIS — Z6830 Body mass index (BMI) 30.0-30.9, adult: Secondary | ICD-10-CM | POA: Diagnosis not present

## 2017-01-26 DIAGNOSIS — R079 Chest pain, unspecified: Secondary | ICD-10-CM | POA: Diagnosis not present

## 2017-01-26 DIAGNOSIS — I255 Ischemic cardiomyopathy: Secondary | ICD-10-CM

## 2017-02-07 ENCOUNTER — Telehealth: Payer: Self-pay | Admitting: Cardiology

## 2017-02-07 ENCOUNTER — Ambulatory Visit: Payer: PRIVATE HEALTH INSURANCE | Admitting: Cardiology

## 2017-02-07 NOTE — Telephone Encounter (Signed)
Patient called requesting to get test results of echo.

## 2017-02-08 NOTE — Telephone Encounter (Signed)
Results of echo given to patient, pcp copied

## 2017-02-21 DIAGNOSIS — J0100 Acute maxillary sinusitis, unspecified: Secondary | ICD-10-CM | POA: Diagnosis not present

## 2017-03-10 ENCOUNTER — Telehealth: Payer: Self-pay | Admitting: Cardiology

## 2017-03-10 MED ORDER — ATORVASTATIN CALCIUM 40 MG PO TABS: 40.0000 mg | ORAL_TABLET | Freq: Every day | ORAL | 1 refills | Status: DC

## 2017-03-10 NOTE — Telephone Encounter (Signed)
Pt aware that per LOV still should be taking Lipitor. Medication sent to pharmacy.

## 2017-03-10 NOTE — Telephone Encounter (Signed)
Patient is questioning if he needs to continue to take Lipitor. If so, will need refill sent to [facility] in [place]. / tg

## 2017-03-18 NOTE — Progress Notes (Signed)
Cardiology Office Note  Date: 03/21/2017   ID: Gerrit, Rafalski 1940/11/06, MRN [id number]  PCP: Rory Percy, MD  Primary Cardiologist: Rozann Lesches, MD   Chief Complaint  Patient presents with  . Coronary Artery Disease  . Mitral valve disease    History of Present Illness: HEROLD SALGUERO is a 76 y.o. male last seen in September.  He presents for a routine follow-up visit.  Since last encounter he reports no chest pain or unusual shortness of breath.  He states that he has been compliant with his medications.  I reviewed his current cardiac regimen.  He is on aspirin, Plavix, Lipitor, Hyzaar, Toprol-XL, and as needed nitroglycerin.  Follow-up echocardiogram in September showed improvement in LVEF to the range of 55-60% as discussed below.  He also had evidence of a myxomatous mitral valve with prolapse and moderate mitral regurgitation.  We discussed these results today.  Past Medical History:  Diagnosis Date  . Coronary artery disease   . Essential hypertension   . GERD (gastroesophageal reflux disease)   . Gout   . Heart murmur   . History of kidney stones   . Pre-diabetes     Past Surgical History:  Procedure Laterality Date  . CARDIAC CATHETERIZATION  01/06/2017  . CHOLECYSTECTOMY    . CORONARY STENT INTERVENTION  01/06/2017   CORONARY STENT INTERVENTION  . CORONARY STENT INTERVENTION N/A 01/06/2017   Performed by Troy Sine, MD at [facility]  . LEFT HEART CATH AND CORONARY ANGIOGRAPHY N/A 01/06/2017   Performed by Troy Sine, MD at [facility]  . LITHOTRIPSY    . TONSILLECTOMY      Current Outpatient Medications  Medication Sig Dispense Refill  . acetaminophen (TYLENOL) 325 MG tablet Take 650 mg by mouth 2 (two) times daily.    . Ascorbic Acid (VITAMIN C PO) Take 1 tablet by mouth daily.    Marland Kitchen aspirin EC 81 MG tablet Take 1 tablet (81 mg total) by mouth daily. 30 tablet 3  . atorvastatin (LIPITOR) 40 MG tablet Take 1  tablet (40 mg total) daily by mouth. 90 tablet 1  . b complex vitamins tablet Take 1 tablet by mouth daily.    . clopidogrel (PLAVIX) 75 MG tablet Take 1 tablet (75 mg total) by mouth daily. 90 tablet 2  . Coenzyme Q10 (COQ-10 PO) Take 1 tablet by mouth daily.    Marland Kitchen losartan-hydrochlorothiazide (HYZAAR) 50-12.5 MG tablet Take 0.5 tablets by mouth daily.    Marland Kitchen MAGNESIUM PO Take 1 tablet by mouth daily.    . metoprolol succinate (TOPROL-XL) 25 MG 24 hr tablet Take 1 tablet (25 mg total) by mouth daily. 90 tablet 1  . nitroGLYCERIN (NITROSTAT) 0.4 MG SL tablet Place 0.4 mg under the tongue every 5 (five) minutes as needed for chest pain.    . ranitidine (ZANTAC) 150 MG tablet Take 150 mg by mouth 2 (two) times daily.     Marland Kitchen triamcinolone cream (KENALOG) 0.1 % Apply 1 application topically daily as needed (rash).      No current facility-administered medications for this visit.    Allergies:  Patient has no known allergies.   Social History: The patient  reports that  has never smoked. he has never used smokeless tobacco. He reports that he does not drink alcohol or use drugs.   ROS:  Please see the history of present illness. Otherwise, complete review of systems is positive for right thigh  and leg discomfort.  All other systems are reviewed and negative.   Physical Exam: VS:  BP 138/80   Pulse 87   Ht 6\' 2"  (1.88 m)   Wt 235 lb 12.8 oz (107 kg)   SpO2 97%   BMI 30.27 kg/m , BMI Body mass index is 30.27 kg/m.  Wt Readings from Last 3 Encounters:  03/21/17 235 lb 12.8 oz (107 kg)  01/17/17 232 lb (105.2 kg)  01/07/17 233 lb 11 oz (106 kg)    General: Elderly male, appears comfortable at rest. HEENT: Conjunctiva and lids normal, oropharynx clear. Neck: Supple, no elevated JVP or carotid bruits, no thyromegaly. Lungs: Clear to auscultation, nonlabored breathing at rest. Cardiac: Regular rate and rhythm, no S3, 3/6 apical systolic murmur, no pericardial rub. Abdomen: Soft, nontender,  bowel sounds present, no guarding or rebound. Extremities: No pitting edema, distal pulses 2+. Skin: Warm and dry. Musculoskeletal: No kyphosis. Neuropsychiatric: Alert and oriented x3, affect grossly appropriate.  ECG: I personally reviewed the tracing from 01/07/2017 which showed sinus rhythm with prolonged PR interval and IVCD, nonspecific T wave changes.  Recent Labwork: 01/07/2017: BUN 19; Creatinine, Ser 1.26; Hemoglobin 12.1; Platelets 172; Potassium 4.0; Sodium 136   Other Studies Reviewed Today:  Echocardiogram 01/26/2017: Study Conclusions  - Left ventricle: The cavity size was normal. Wall thickness was   increased in a pattern of moderate LVH. Systolic function was   normal. The estimated ejection fraction was in the range of 55%   to 60%. There is hypokinesis of the basalinferior myocardium.   Doppler parameters are consistent with abnormal left ventricular   relaxation (grade 1 diastolic dysfunction). - Aortic valve: Mildly calcified annulus. Trileaflet; mildly   calcified leaflets. There was trivial regurgitation. - Mitral valve: Mildly calcified annulus. Mildly thickened leaflets   Moderate prolapse, involving the posterior leaflet. There was   moderate regurgitation directed anteriorly. - Tricuspid valve: There was trivial regurgitation. Peak RV-RA   gradient (S): 29 mm Hg. - Pulmonary arteries: Systolic pressure could not be accurately   estimated.  Impressions:  - Moderate LVH with LVEF 55-60%. There is hypokinesis of the basal   inferior wall. Grade 1 diastolic dysfunction. Mild mitral annular   calcification with myxomatous appearing mitral leaflets and   moderate posterior leaflet prolapse. There is moderate anteriorly   directed mitral regurgitation. Mildly calcified aortic valve with   trivial aortic regurgitation. Trivial tricuspid regurgitation   with RV-RA gradient 29 mmHg.  Assessment and Plan:  1.  Multivessel CAD status post DES intervention to  the ramus intermedius with residual disease including ostial occlusion of the LAD with right to left collaterals and a 90% posterolateral stenosis.  He is doing well on medical therapy.  Continue dual antiplatelet regimen.  2.  Ischemic cardiomyopathy with normalization of LVEF by recent follow-up echocardiogram.  3.  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  Follow-up echocardiogram will be obtained in 1 year.  4.  Essential hypertension, no changes made to current regimen.  5.  Mixed hyperlipidemia, on Lipitor 40 mg daily.  He is due for follow-up lab work with physical per Dr. Nadara Mustard.  Current medicines were reviewed with the patient today.  Disposition: Follow-up in 6 months.  Signed, Satira Sark, MD, [facility] 03/21/2017 1:55 PM    [facility] [place], [place], [place] [address] Phone: [phone]; Fax: [phone]

## 2017-03-21 ENCOUNTER — Ambulatory Visit (INDEPENDENT_AMBULATORY_CARE_PROVIDER_SITE_OTHER): Payer: Medicare Other | Admitting: Cardiology

## 2017-03-21 ENCOUNTER — Encounter: Payer: Self-pay | Admitting: Cardiology

## 2017-03-21 VITALS — BP 138/80 | HR 87 | Ht 74.0 in | Wt 235.8 lb

## 2017-03-21 DIAGNOSIS — E782 Mixed hyperlipidemia: Secondary | ICD-10-CM | POA: Diagnosis not present

## 2017-03-21 DIAGNOSIS — I25119 Atherosclerotic heart disease of native coronary artery with unspecified angina pectoris: Secondary | ICD-10-CM | POA: Diagnosis not present

## 2017-03-21 DIAGNOSIS — I10 Essential (primary) hypertension: Secondary | ICD-10-CM

## 2017-03-21 DIAGNOSIS — I200 Unstable angina: Secondary | ICD-10-CM | POA: Diagnosis not present

## 2017-03-21 DIAGNOSIS — I255 Ischemic cardiomyopathy: Secondary | ICD-10-CM

## 2017-03-21 DIAGNOSIS — I341 Nonrheumatic mitral (valve) prolapse: Secondary | ICD-10-CM | POA: Diagnosis not present

## 2017-03-21 DIAGNOSIS — I209 Angina pectoris, unspecified: Secondary | ICD-10-CM

## 2017-03-21 NOTE — Patient Instructions (Signed)

## 2017-04-01 DIAGNOSIS — M4688 Other specified inflammatory spondylopathies, sacral and sacrococcygeal region: Secondary | ICD-10-CM | POA: Diagnosis not present

## 2017-04-01 DIAGNOSIS — G8929 Other chronic pain: Secondary | ICD-10-CM | POA: Diagnosis not present

## 2017-04-01 DIAGNOSIS — M160 Bilateral primary osteoarthritis of hip: Secondary | ICD-10-CM | POA: Diagnosis not present

## 2017-04-01 DIAGNOSIS — M1611 Unilateral primary osteoarthritis, right hip: Secondary | ICD-10-CM | POA: Diagnosis not present

## 2017-04-01 DIAGNOSIS — M25551 Pain in right hip: Secondary | ICD-10-CM | POA: Diagnosis not present

## 2017-04-02 HISTORY — PX: OTHER SURGICAL HISTORY: SHX169

## 2017-04-12 DIAGNOSIS — E7800 Pure hypercholesterolemia, unspecified: Secondary | ICD-10-CM | POA: Diagnosis not present

## 2017-04-12 DIAGNOSIS — E1165 Type 2 diabetes mellitus with hyperglycemia: Secondary | ICD-10-CM | POA: Diagnosis not present

## 2017-04-12 DIAGNOSIS — I10 Essential (primary) hypertension: Secondary | ICD-10-CM | POA: Diagnosis not present

## 2017-04-12 DIAGNOSIS — R011 Cardiac murmur, unspecified: Secondary | ICD-10-CM | POA: Diagnosis not present

## 2017-04-15 DIAGNOSIS — I25119 Atherosclerotic heart disease of native coronary artery with unspecified angina pectoris: Secondary | ICD-10-CM | POA: Diagnosis not present

## 2017-04-15 DIAGNOSIS — E782 Mixed hyperlipidemia: Secondary | ICD-10-CM | POA: Diagnosis not present

## 2017-04-15 DIAGNOSIS — I341 Nonrheumatic mitral (valve) prolapse: Secondary | ICD-10-CM | POA: Diagnosis not present

## 2017-04-15 DIAGNOSIS — Z6831 Body mass index (BMI) 31.0-31.9, adult: Secondary | ICD-10-CM | POA: Diagnosis not present

## 2017-04-15 DIAGNOSIS — I10 Essential (primary) hypertension: Secondary | ICD-10-CM | POA: Diagnosis not present

## 2017-04-15 DIAGNOSIS — I255 Ischemic cardiomyopathy: Secondary | ICD-10-CM | POA: Diagnosis not present

## 2017-04-15 DIAGNOSIS — E1165 Type 2 diabetes mellitus with hyperglycemia: Secondary | ICD-10-CM | POA: Diagnosis not present

## 2017-04-18 DIAGNOSIS — Z23 Encounter for immunization: Secondary | ICD-10-CM | POA: Diagnosis not present

## 2017-06-03 HISTORY — PX: COLONOSCOPY WITH ESOPHAGOGASTRODUODENOSCOPY (EGD): SHX5779

## 2017-06-09 DIAGNOSIS — I700 Atherosclerosis of aorta: Secondary | ICD-10-CM | POA: Diagnosis not present

## 2017-06-09 DIAGNOSIS — N200 Calculus of kidney: Secondary | ICD-10-CM | POA: Diagnosis not present

## 2017-06-09 DIAGNOSIS — Z79899 Other long term (current) drug therapy: Secondary | ICD-10-CM | POA: Diagnosis not present

## 2017-06-09 DIAGNOSIS — R739 Hyperglycemia, unspecified: Secondary | ICD-10-CM | POA: Diagnosis not present

## 2017-06-09 DIAGNOSIS — K922 Gastrointestinal hemorrhage, unspecified: Secondary | ICD-10-CM | POA: Diagnosis not present

## 2017-06-09 DIAGNOSIS — N400 Enlarged prostate without lower urinary tract symptoms: Secondary | ICD-10-CM | POA: Diagnosis not present

## 2017-06-09 DIAGNOSIS — K625 Hemorrhage of anus and rectum: Secondary | ICD-10-CM | POA: Diagnosis not present

## 2017-06-09 DIAGNOSIS — Z7901 Long term (current) use of anticoagulants: Secondary | ICD-10-CM | POA: Diagnosis not present

## 2017-06-10 DIAGNOSIS — Z6830 Body mass index (BMI) 30.0-30.9, adult: Secondary | ICD-10-CM | POA: Diagnosis not present

## 2017-06-10 DIAGNOSIS — K625 Hemorrhage of anus and rectum: Secondary | ICD-10-CM | POA: Diagnosis not present

## 2017-06-10 DIAGNOSIS — Z79899 Other long term (current) drug therapy: Secondary | ICD-10-CM | POA: Diagnosis not present

## 2017-06-10 DIAGNOSIS — D62 Acute posthemorrhagic anemia: Secondary | ICD-10-CM | POA: Diagnosis not present

## 2017-06-10 DIAGNOSIS — K648 Other hemorrhoids: Secondary | ICD-10-CM | POA: Diagnosis not present

## 2017-06-10 DIAGNOSIS — K2900 Acute gastritis without bleeding: Secondary | ICD-10-CM | POA: Diagnosis not present

## 2017-06-10 DIAGNOSIS — Z7902 Long term (current) use of antithrombotics/antiplatelets: Secondary | ICD-10-CM | POA: Diagnosis not present

## 2017-06-10 DIAGNOSIS — K921 Melena: Secondary | ICD-10-CM | POA: Diagnosis not present

## 2017-06-10 DIAGNOSIS — K922 Gastrointestinal hemorrhage, unspecified: Secondary | ICD-10-CM | POA: Diagnosis not present

## 2017-06-10 DIAGNOSIS — I10 Essential (primary) hypertension: Secondary | ICD-10-CM | POA: Diagnosis not present

## 2017-06-10 DIAGNOSIS — K259 Gastric ulcer, unspecified as acute or chronic, without hemorrhage or perforation: Secondary | ICD-10-CM | POA: Diagnosis not present

## 2017-06-10 DIAGNOSIS — K5731 Diverticulosis of large intestine without perforation or abscess with bleeding: Secondary | ICD-10-CM | POA: Diagnosis not present

## 2017-06-10 DIAGNOSIS — E669 Obesity, unspecified: Secondary | ICD-10-CM | POA: Diagnosis not present

## 2017-06-10 DIAGNOSIS — I2510 Atherosclerotic heart disease of native coronary artery without angina pectoris: Secondary | ICD-10-CM | POA: Diagnosis not present

## 2017-06-10 DIAGNOSIS — Z791 Long term (current) use of non-steroidal anti-inflammatories (NSAID): Secondary | ICD-10-CM | POA: Diagnosis not present

## 2017-06-10 DIAGNOSIS — Z955 Presence of coronary angioplasty implant and graft: Secondary | ICD-10-CM | POA: Diagnosis not present

## 2017-06-11 DIAGNOSIS — K922 Gastrointestinal hemorrhage, unspecified: Secondary | ICD-10-CM | POA: Diagnosis not present

## 2017-06-11 DIAGNOSIS — K921 Melena: Secondary | ICD-10-CM | POA: Diagnosis not present

## 2017-06-11 DIAGNOSIS — I2510 Atherosclerotic heart disease of native coronary artery without angina pectoris: Secondary | ICD-10-CM | POA: Diagnosis not present

## 2017-06-11 DIAGNOSIS — K5731 Diverticulosis of large intestine without perforation or abscess with bleeding: Secondary | ICD-10-CM | POA: Diagnosis not present

## 2017-06-11 DIAGNOSIS — K2900 Acute gastritis without bleeding: Secondary | ICD-10-CM | POA: Diagnosis not present

## 2017-06-11 DIAGNOSIS — K648 Other hemorrhoids: Secondary | ICD-10-CM | POA: Diagnosis not present

## 2017-06-11 DIAGNOSIS — I10 Essential (primary) hypertension: Secondary | ICD-10-CM | POA: Diagnosis not present

## 2017-06-11 DIAGNOSIS — D62 Acute posthemorrhagic anemia: Secondary | ICD-10-CM | POA: Diagnosis not present

## 2017-06-11 DIAGNOSIS — K5730 Diverticulosis of large intestine without perforation or abscess without bleeding: Secondary | ICD-10-CM | POA: Diagnosis not present

## 2017-07-16 ENCOUNTER — Other Ambulatory Visit: Payer: Self-pay | Admitting: Cardiology

## 2017-08-11 DIAGNOSIS — I10 Essential (primary) hypertension: Secondary | ICD-10-CM | POA: Diagnosis not present

## 2017-08-11 DIAGNOSIS — E7800 Pure hypercholesterolemia, unspecified: Secondary | ICD-10-CM | POA: Diagnosis not present

## 2017-08-11 DIAGNOSIS — I255 Ischemic cardiomyopathy: Secondary | ICD-10-CM | POA: Diagnosis not present

## 2017-08-11 DIAGNOSIS — E782 Mixed hyperlipidemia: Secondary | ICD-10-CM | POA: Diagnosis not present

## 2017-08-11 DIAGNOSIS — R5382 Chronic fatigue, unspecified: Secondary | ICD-10-CM | POA: Diagnosis not present

## 2017-08-11 DIAGNOSIS — E1165 Type 2 diabetes mellitus with hyperglycemia: Secondary | ICD-10-CM | POA: Diagnosis not present

## 2017-08-15 DIAGNOSIS — Z1331 Encounter for screening for depression: Secondary | ICD-10-CM | POA: Diagnosis not present

## 2017-08-15 DIAGNOSIS — Z1389 Encounter for screening for other disorder: Secondary | ICD-10-CM | POA: Diagnosis not present

## 2017-08-15 DIAGNOSIS — I255 Ischemic cardiomyopathy: Secondary | ICD-10-CM | POA: Diagnosis not present

## 2017-08-15 DIAGNOSIS — E782 Mixed hyperlipidemia: Secondary | ICD-10-CM | POA: Diagnosis not present

## 2017-08-15 DIAGNOSIS — Z681 Body mass index (BMI) 19 or less, adult: Secondary | ICD-10-CM | POA: Diagnosis not present

## 2017-08-15 DIAGNOSIS — E1165 Type 2 diabetes mellitus with hyperglycemia: Secondary | ICD-10-CM | POA: Diagnosis not present

## 2017-08-15 DIAGNOSIS — I341 Nonrheumatic mitral (valve) prolapse: Secondary | ICD-10-CM | POA: Diagnosis not present

## 2017-08-15 DIAGNOSIS — I10 Essential (primary) hypertension: Secondary | ICD-10-CM | POA: Diagnosis not present

## 2017-08-15 DIAGNOSIS — Z0001 Encounter for general adult medical examination with abnormal findings: Secondary | ICD-10-CM | POA: Diagnosis not present

## 2017-08-15 DIAGNOSIS — I25119 Atherosclerotic heart disease of native coronary artery with unspecified angina pectoris: Secondary | ICD-10-CM | POA: Diagnosis not present

## 2017-09-01 DIAGNOSIS — M1611 Unilateral primary osteoarthritis, right hip: Secondary | ICD-10-CM | POA: Diagnosis not present

## 2017-09-19 ENCOUNTER — Encounter: Payer: Self-pay | Admitting: Physician Assistant

## 2017-09-19 NOTE — Progress Notes (Signed)
Cardiology Office Note    Date:  09/20/2017  ID:  Christopher, Nelson 09-18-1940, MRN [id number] PCP:  Rory Percy, MD  Cardiologist:  Dr. Domenic Polite  Chief Complaint: pre-operative evaluation  History of Present Illness:  Christopher Nelson is a 77 y.o. male with history of CAD s/p prior PCI, prior ischemic cardiomyopathy (EF 35-40% by cath 01/2017 then 55-60%), HTN, mixed HLD, CKD III, gout, kidney stones, pre-diabetes, myxomatous mitral valve with prolapse and moderate mitral regurgitation who presents for pre-operative clearance. He underwent cath 01/2017 showing 100% ostial LAD, 90% 2nd RPLB, 99% lat ramus, s/p PCI to the twin like branch of a large ramus intermediate vessel, treated with PTCA, cutting balloon, and DES stenting with ultimate insertion of a 2.7518 mm Resolute Onyx DES stent postdilated to 3.0 with the 99% stenosis being reduced to 0%. Medical therapy was recommended for the residual disease. LVEF 35-40%. There was collateralization to distal-mid LAD via the distal RCA. DAPT for a minimum of 1 year was recommended. F/u echo several weeks later in 01/2017 showed moderate LVH, EF 55-60%, grade 1 DD, moderate mitral valve prolapse with MR. Last labs 01/2017 showed Hgb 12.1, K 4.0, Cr 1.26, glucose 175. No recent lipids on file.  He returns for follow-up today to discuss upcoming possible hip surgery. He has arthritis and is in need of a total hip arthroplasty. It is not completely debilitating but he notices it has slowed down his usually very active pace. He is still able to do yardwork, ride-mow, walk up stairs and hills. He does not have any angina or dyspnea with this. His BP occasionally runs higher in the 150s at home but when he rechecks it it comes back to the 120s. He is taking Tylenol 1000mg  BID for the hip pain which makes it manageable. He continues to work in [employer].    Past Medical History:  Diagnosis Date  . CKD (chronic kidney disease), stage III ([place])   . Coronary  artery disease    a. unstable angina s/p cath 01/2017 -  specifically DES to a large branching ramus intermedius. Residual disease includes ostial occlusion of the LAD with right-to-left collaterals, also 90% posterolateral stenosis. EF 35-40% by cath but then 55-60% by echo.  . Essential hypertension   . GERD (gastroesophageal reflux disease)   . Gout   . Heart murmur   . History of kidney stones   . Ischemic cardiomyopathy    a. EF 35-40% by cath then 55-60% by echo shortly after in 01/2017.  . Mitral valve disease    a. 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  . Pre-diabetes     Past Surgical History:  Procedure Laterality Date  . CARDIAC CATHETERIZATION  01/06/2017  . CHOLECYSTECTOMY    . CORONARY STENT INTERVENTION  01/06/2017   CORONARY STENT INTERVENTION  . CORONARY STENT INTERVENTION N/A 01/06/2017   Procedure: CORONARY STENT INTERVENTION;  Surgeon: Troy Sine, MD;  Location: [facility];  Service: Cardiovascular;  Laterality: N/A;  . LEFT HEART CATH AND CORONARY ANGIOGRAPHY N/A 01/06/2017   Procedure: LEFT HEART CATH AND CORONARY ANGIOGRAPHY;  Surgeon: Troy Sine, MD;  Location: [facility];  Service: Cardiovascular;  Laterality: N/A;  . LITHOTRIPSY    . TONSILLECTOMY      Current Medications: Current Meds  Medication Sig  . acetaminophen (TYLENOL) 325 MG tablet Take 650 mg by mouth 2 (two) times daily.  Marland Kitchen  Ascorbic Acid (VITAMIN C PO) Take 1 tablet by mouth daily.  Marland Kitchen aspirin EC 81 MG tablet Take 1 tablet (81 mg total) by mouth daily.  Marland Kitchen atorvastatin (LIPITOR) 40 MG tablet Take 1 tablet (40 mg total) daily by mouth.  Marland Kitchen b complex vitamins tablet Take 1 tablet by mouth daily.  . clopidogrel (PLAVIX) 75 MG tablet Take 1 tablet (75 mg total) by mouth daily.  . Coenzyme Q10 (COQ-10 PO) Take 1 tablet by mouth daily.  Marland Kitchen losartan-hydrochlorothiazide (HYZAAR) 50-12.5 MG tablet Take 0.5 tablets by mouth daily.    Marland Kitchen MAGNESIUM PO Take 1 tablet by mouth daily.  . metoprolol succinate (TOPROL-XL) 25 MG 24 hr tablet TAKE 1 TABLET BY MOUTH ONCE DAILY  . nitroGLYCERIN (NITROSTAT) 0.4 MG SL tablet Place 0.4 mg under the tongue every 5 (five) minutes as needed for chest pain.  . ranitidine (ZANTAC) 150 MG tablet Take 150 mg by mouth 2 (two) times daily.   Marland Kitchen triamcinolone cream (KENALOG) 0.1 % Apply 1 application topically daily as needed (rash).     Allergies:   Patient has no known allergies.   Social History   Socioeconomic History  . Marital status: Married    Spouse name: Not on file  . Number of children: Not on file  . Years of education: Not on file  . Highest education level: Not on file  Occupational History  . Not on file  Social Needs  . Financial resource strain: Not on file  . Food insecurity:    Worry: Not on file    Inability: Not on file  . Transportation needs:    Medical: Not on file    Non-medical: Not on file  Tobacco Use  . Smoking status: Never Smoker  . Smokeless tobacco: Never Used  Substance and Sexual Activity  . Alcohol use: No  . Drug use: No  . Sexual activity: Not on file  Lifestyle  . Physical activity:    Days per week: Not on file    Minutes per session: Not on file  . Stress: Not on file  Relationships  . Social connections:    Talks on phone: Not on file    Gets together: Not on file    Attends religious service: Not on file    Active member of club or organization: Not on file    Attends meetings of clubs or organizations: Not on file    Relationship status: Not on file  Other Topics Concern  . Not on file  Social History Narrative  . Not on file     Family History:  Family History  Problem Relation Age of Onset  . Uterine cancer Mother   . Diabetes Mellitus II Sister   . Heart attack Brother        Age 66  . Pancreatic cancer Sister     ROS:   Please see the history of present illness.  All other systems are reviewed and otherwise  negative.    PHYSICAL EXAM:   VS:  BP (!) 162/84   Pulse 78   Ht 6\' 2"  (1.88 m)   Wt 235 lb (106.6 kg)   SpO2 97%   BMI 30.17 kg/m   BMI: Body mass index is 30.17 kg/m. GEN: Well nourished, well developed WM, in no acute distress  HEENT: normocephalic, atraumatic Neck: no JVD, carotid bruits, or masses Cardiac: RRR; 2/6 SEM at apex. No rubs or gallops, no edema  Respiratory:  clear to auscultation bilaterally,  normal work of breathing GI: soft, nontender, nondistended, + BS MS: no deformity or atrophy  Skin: warm and dry, no rash Neuro:  Alert and Oriented x 3, Strength and sensation are intact, follows commands Psych: euthymic mood, full affect  Wt Readings from Last 3 Encounters:  09/20/17 235 lb (106.6 kg)  03/21/17 235 lb 12.8 oz (107 kg)  01/17/17 232 lb (105.2 kg)      Studies/Labs Reviewed:   EKG:  EKG was ordered today and personally reviewed by me and demonstrates NSR 1st degree AVB, RBBB with LAFB  Recent Labs: 01/07/2017: BUN 19; Creatinine, Ser 1.26; Hemoglobin 12.1; Platelets 172; Potassium 4.0; Sodium 136   Lipid Panel No results found for: CHOL, TRIG, HDL, CHOLHDL, VLDL, LDLCALC, LDLDIRECT  Additional studies/ records that were reviewed today include: Summarized above.    ASSESSMENT & PLAN:   1. Pre-operative clearance - from a cardiac standpoint he is not describing any new anginal symptoms that would be prohibitive of surgery. However, he is only out 8 months out from PCI. Ideally would recommend not to interrupt anticoagulation during the 12 month window after PCP unless absolutely necessary. The patient feels his hip surgery can wait. I've encouraged him to reach out to his orthopedic doctor to discuss conservative strategies in the meantime to buy time. He avoids NSAIDS due to his DAPT. He does have room to move on his tylenol if needed. He does not wish to use any prednisone. I will defer to ortho. Will have him come back to see Dr. Domenic Polite in 12/2017  at which time pre-op clearance can be definitively assessed and a plan can be made for clearing him to come off his dual antiplatelet therapy. The patient understands if his quality of life is severely impaired by worsening pain this can be revisited, but the safest thing to do is not to interrupt the antiplatelets until after the 12 months is up. 2. CAD - as above. No ischemia. 3. Ischemic cardiomyopathy - improved LVEF by echo 01/2017. 4. Mitral valve disease - stable, no new symptoms. Follow clinically. Timing of surveillance echoes will be at discretion of Dr. Domenic Polite. 5. Essential HTN - recheck by me 150/90. Check BMET today. If renal function stable, plan to titrate losartan/HCTZ to 1 tablet daily. The patient remotely cut this in half himself due to softer BPs but they have been running higher recently. 6. RBBB - reviewed with Dr. Domenic Polite. He feels the previous IVCD was not likely a LBBB, but rather a nonspecific IVCD and this may just represent evolution of such. He also has evidence of a 1st degree AV block and LPFB. He's not had any dizziness or syncope. Continue to monitor.  Disposition: F/u with Dr. Domenic Polite in 12/2017.   Medication Adjustments/Labs and Tests Ordered: Current medicines are reviewed at length with the patient today.  Concerns regarding medicines are outlined above. Medication changes, Labs and Tests ordered today are summarized above and listed in the Patient Instructions accessible in Encounters.   Signed, Charlie Pitter, PA-C  09/20/2017 3:40 PM    [facility] [place]. [place], [place] [address] Ph: [phone]; Fax [phone]

## 2017-09-20 ENCOUNTER — Ambulatory Visit (INDEPENDENT_AMBULATORY_CARE_PROVIDER_SITE_OTHER): Payer: Medicare Other | Admitting: Physician Assistant

## 2017-09-20 ENCOUNTER — Other Ambulatory Visit (HOSPITAL_COMMUNITY)
Admit: 2017-09-20 | Discharge: 2017-09-20 | Disposition: A | Discharge status: Home / Self Care | Payer: Medicare Other | Source: Ambulatory Visit | Attending: Physician Assistant | Admitting: Physician Assistant

## 2017-09-20 ENCOUNTER — Encounter: Payer: Self-pay | Admitting: Physician Assistant

## 2017-09-20 VITALS — BP 162/84 | HR 78 | Ht 74.0 in | Wt 235.0 lb

## 2017-09-20 DIAGNOSIS — I4510 Unspecified right bundle-branch block: Secondary | ICD-10-CM | POA: Diagnosis not present

## 2017-09-20 DIAGNOSIS — I2510 Atherosclerotic heart disease of native coronary artery without angina pectoris: Secondary | ICD-10-CM

## 2017-09-20 DIAGNOSIS — I059 Rheumatic mitral valve disease, unspecified: Secondary | ICD-10-CM | POA: Diagnosis not present

## 2017-09-20 DIAGNOSIS — I255 Ischemic cardiomyopathy: Secondary | ICD-10-CM

## 2017-09-20 DIAGNOSIS — I10 Essential (primary) hypertension: Secondary | ICD-10-CM | POA: Diagnosis not present

## 2017-09-20 DIAGNOSIS — Z01810 Encounter for preprocedural cardiovascular examination: Secondary | ICD-10-CM

## 2017-09-20 LAB — BASIC METABOLIC PANEL
ANION GAP: 9 (ref 5–15)
BUN: 29 mg/dL — ABNORMAL HIGH (ref 6–20)
CHLORIDE: 98 mmol/L — AB (ref 101–111)
CO2: 30 mmol/L (ref 22–32)
Calcium: 9.7 mg/dL (ref 8.9–10.3)
Creatinine, Ser: 1.35 mg/dL — ABNORMAL HIGH (ref 0.61–1.24)
GFR calc Af Amer: 57 mL/min — ABNORMAL LOW (ref >60)
GFR calc non Af Amer: 49 mL/min — ABNORMAL LOW (ref >60)
GLUCOSE: 144 mg/dL — AB (ref 65–99)
POTASSIUM: 3.9 mmol/L (ref 3.5–5.1)
Sodium: 137 mmol/L (ref 135–145)

## 2017-09-20 NOTE — Patient Instructions (Signed)
Medication Instructions:  Your physician recommends that you continue on your current medications as directed. Please refer to the Current Medication list given to you today.   Labwork: Your physician recommends that you return for lab work today.    Testing/Procedures: NONE   Follow-Up: Your physician recommends that you schedule a follow-up appointment in: August.     Any Other Special Instructions Will Be Listed Below (If Applicable).     If you need a refill on your cardiac medications before your next appointment, please call your pharmacy.  Thank you for choosing [facility]!

## 2017-09-21 ENCOUNTER — Telehealth: Payer: Self-pay

## 2017-09-21 NOTE — Telephone Encounter (Signed)
Called pt. No answer. Left message for pt to return call.  

## 2017-09-21 NOTE — Telephone Encounter (Signed)
-----   Message from Charlie Pitter, [place] sent at 09/21/2017  7:41 AM EDT ----- Please let patient know that his kidney function is slightly elevated from prior; please send copy to PCP - he needs to make sure to stay hydrated up to a max of 64 oz per day. This should be trended further by primary care within 1-2 weeks. Given this finding I would not increase his losartan/HCTZ but instead add amlodipine at 5mg  daily. Please monitor blood pressure occasionally at home. Call us if he tends to get readings greater than 130 on the top number or 80 on the bottom number.  Also, can you please call [facility] to let him know we could not provide clearance for his hip surgery because he is less than 12 months out from stenting, and cannot come off ASA/Plavix until then? I have forwarded a copy of my note but please call them as a double measure so they know to review his case for alternative therapies until after Sept 2019.  Dayna Dunn PA-C

## 2017-09-22 ENCOUNTER — Telehealth: Payer: Self-pay

## 2017-09-22 MED ORDER — AMLODIPINE BESYLATE 5 MG PO TABS: 5.0000 mg | ORAL_TABLET | Freq: Every day | ORAL | 3 refills | Status: DC

## 2017-09-22 NOTE — Telephone Encounter (Signed)
-----   Message from Charlie Pitter, [place] sent at 09/21/2017  7:41 AM EDT ----- Please let patient know that his kidney function is slightly elevated from prior; please send copy to PCP - he needs to make sure to stay hydrated up to a max of 64 oz per day. This should be trended further by primary care within 1-2 weeks. Given this finding I would not increase his losartan/HCTZ but instead add amlodipine at 5mg  daily. Please monitor blood pressure occasionally at home. Call us if he tends to get readings greater than 130 on the top number or 80 on the bottom number.  Also, can you please call [facility] to let him know we could not provide clearance for his hip surgery because he is less than 12 months out from stenting, and cannot come off ASA/Plavix until then? I have forwarded a copy of my note but please call them as a double measure so they know to review his case for alternative therapies until after Sept 2019.  Dayna Dunn PA-C

## 2017-09-22 NOTE — Telephone Encounter (Signed)
Pt made aware. Informed of instructions. He voiced understanding. Sent RX to [facility] in [place].

## 2017-09-26 ENCOUNTER — Other Ambulatory Visit: Payer: Self-pay | Admitting: Cardiology

## 2017-10-09 ENCOUNTER — Other Ambulatory Visit: Payer: Self-pay | Admitting: Cardiology

## 2017-11-22 DIAGNOSIS — H538 Other visual disturbances: Secondary | ICD-10-CM | POA: Diagnosis not present

## 2017-12-17 DIAGNOSIS — R1084 Generalized abdominal pain: Secondary | ICD-10-CM | POA: Diagnosis not present

## 2017-12-17 DIAGNOSIS — Z6829 Body mass index (BMI) 29.0-29.9, adult: Secondary | ICD-10-CM | POA: Diagnosis not present

## 2017-12-17 DIAGNOSIS — R110 Nausea: Secondary | ICD-10-CM | POA: Diagnosis not present

## 2017-12-19 DIAGNOSIS — R109 Unspecified abdominal pain: Secondary | ICD-10-CM | POA: Diagnosis not present

## 2017-12-19 DIAGNOSIS — R197 Diarrhea, unspecified: Secondary | ICD-10-CM | POA: Diagnosis not present

## 2017-12-19 DIAGNOSIS — E860 Dehydration: Secondary | ICD-10-CM | POA: Diagnosis not present

## 2017-12-20 NOTE — Progress Notes (Signed)
Cardiology Office Note  Date: 12/21/2017   ID: Donnald, Tabar 1940-10-12, MRN [id number]  PCP: Rory Percy, MD  Primary Cardiologist: Rozann Lesches, MD   Chief Complaint  Patient presents with  . Coronary Artery Disease    History of Present Illness: Christopher Nelson is a 77 y.o. male last seen in May by Ms. Dunn PA-C.  He is here today for a follow-up visit, also for preoperative cardiac evaluation in anticipation of right hip replacement.  Surgery has not yet been scheduled.  From a cardiac perspective he does not report any angina symptoms, no significant palpitations or syncope, stable NYHA class II dyspnea.  His main limitation now is related to significant right hip pain and stiffness.  He underwent DES intervention in September of last year, now nearing 1 year completion of dual antiplatelet therapy.  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  He describes activities at this time that meet 4 METS.  I reviewed his most recent ECG from May.  Past Medical History:  Diagnosis Date  . CKD (chronic kidney disease), stage III ([place])   . Coronary artery disease    a. unstable angina s/p cath 01/2017 -  specifically DES to a large branching ramus intermedius. Residual disease includes ostial occlusion of the LAD with right-to-left collaterals, also 90% posterolateral stenosis. EF 35-40% by cath but then 55-60% by echo.  . Essential hypertension   . GERD (gastroesophageal reflux disease)   . Gout   . Heart murmur   . History of kidney stones   . Ischemic cardiomyopathy    a. EF 35-40% by cath then 55-60% by echo shortly after in 01/2017.  . Mitral valve disease    a. 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  . Pre-diabetes     Past Surgical History:  Procedure Laterality  Date  . CARDIAC CATHETERIZATION  01/06/2017  . CHOLECYSTECTOMY    . CORONARY STENT INTERVENTION  01/06/2017   CORONARY STENT INTERVENTION  . CORONARY STENT INTERVENTION N/A 01/06/2017   Procedure: CORONARY STENT INTERVENTION;  Surgeon: Troy Sine, MD;  Location: [facility];  Service: Cardiovascular;  Laterality: N/A;  . LEFT HEART CATH AND CORONARY ANGIOGRAPHY N/A 01/06/2017   Procedure: LEFT HEART CATH AND CORONARY ANGIOGRAPHY;  Surgeon: Troy Sine, MD;  Location: [facility];  Service: Cardiovascular;  Laterality: N/A;  . LITHOTRIPSY    . TONSILLECTOMY      Current Outpatient Medications  Medication Sig Dispense Refill  . acetaminophen (TYLENOL) 325 MG tablet Take 650 mg by mouth 2 (two) times daily.    Marland Kitchen amLODipine (NORVASC) 5 MG tablet Take 1 tablet (5 mg total) by mouth daily. 180 tablet 3  . Ascorbic Acid (VITAMIN C PO) Take 1 tablet by mouth daily.    Marland Kitchen aspirin EC 81 MG tablet Take 1 tablet (81 mg total) by mouth daily. 30 tablet 3  . atorvastatin (LIPITOR) 40 MG tablet TAKE 1 TABLET BY MOUTH ONCE DAILY 90 tablet 1  . b complex vitamins tablet Take 1 tablet by mouth daily.    . clopidogrel (PLAVIX) 75 MG tablet TAKE 1 TABLET BY MOUTH ONCE DAILY 90 tablet 2  . Coenzyme Q10 (COQ-10 PO) Take 1 tablet by mouth daily.    Marland Kitchen losartan-hydrochlorothiazide (HYZAAR) 50-12.5 MG tablet Take  0.5 tablets by mouth daily.    Marland Kitchen MAGNESIUM PO Take 1 tablet by mouth daily.    . metoprolol succinate (TOPROL-XL) 25 MG 24 hr tablet TAKE 1 TABLET BY MOUTH ONCE DAILY 90 tablet 0  . ranitidine (ZANTAC) 150 MG tablet Take 150 mg by mouth 2 (two) times daily.     Marland Kitchen triamcinolone cream (KENALOG) 0.1 % Apply 1 application topically daily as needed (rash).      No current facility-administered medications for this visit.    Allergies:  Patient has no known allergies.   Social History: The patient  reports that he has never smoked. He has never used smokeless tobacco. He reports that he  does not drink alcohol or use drugs.   ROS:  Please see the history of present illness. Otherwise, complete review of systems is positive for right hip pain, pending cataract surgery.  All other systems are reviewed and negative.   Physical Exam: VS:  BP 119/67   Pulse 82   Ht 6\' 2"  (1.88 m)   Wt 238 lb (108 kg)   SpO2 98%   BMI 30.56 kg/m , BMI Body mass index is 30.56 kg/m.  Wt Readings from Last 3 Encounters:  12/21/17 238 lb (108 kg)  09/20/17 235 lb (106.6 kg)  03/21/17 235 lb 12.8 oz (107 kg)    General: Elderly male, patient appears comfortable at rest. HEENT: Conjunctiva and lids normal, oropharynx clear. Neck: Supple, no elevated JVP or carotid bruits, no thyromegaly. Lungs: Clear to auscultation, nonlabored breathing at rest. Cardiac: Regular rate and rhythm, no S3, 3/6 mid-to-late apical systolic murmur, no pericardial rub. Abdomen: Soft, nontender, bowel sounds present. Extremities: No pitting edema, distal pulses 2+. Skin: Warm and dry. Musculoskeletal: No kyphosis. Neuropsychiatric: Alert and oriented x3, affect grossly appropriate.  ECG: I personally reviewed the tracing from 09/20/2017 which showed sinus rhythm with prolonged PR interval, right bundle branch block, and left anterior fascicular block rule out old inferior infarct pattern.  Recent Labwork: 01/07/2017: Hemoglobin 12.1; Platelets 172 09/20/2017: BUN 29; Creatinine, Ser 1.35; Potassium 3.9; Sodium 137   Other Studies Reviewed Today:  Echocardiogram 01/26/2017: Study Conclusions  - Left ventricle: The cavity size was normal. Wall thickness was   increased in a pattern of moderate LVH. Systolic function was   normal. The estimated ejection fraction was in the range of 55%   to 60%. There is hypokinesis of the basalinferior myocardium.   Doppler parameters are consistent with abnormal left ventricular   relaxation (grade 1 diastolic dysfunction). - Aortic valve: Mildly calcified annulus. Trileaflet;  mildly   calcified leaflets. There was trivial regurgitation. - Mitral valve: Mildly calcified annulus. Mildly thickened leaflets   Moderate prolapse, involving the posterior leaflet. There was   moderate regurgitation directed anteriorly. - Tricuspid valve: There was trivial regurgitation. Peak RV-RA   gradient (S): 29 mm Hg. - Pulmonary arteries: Systolic pressure could not be accurately   estimated.  Impressions:  - Moderate LVH with LVEF 55-60%. There is hypokinesis of the basal   inferior wall. Grade 1 diastolic dysfunction. Mild mitral annular   calcification with myxomatous appearing mitral leaflets and   moderate posterior leaflet prolapse. There is moderate anteriorly   directed mitral regurgitation. Mildly calcified aortic valve with   trivial aortic regurgitation. Trivial tricuspid regurgitation   with RV-RA gradient 29 mmHg.  Cardiac catheterization and PCI 01/06/2017:  Ost LAD lesion, 100 %stenosed.  2nd RPLB lesion, 90 %stenosed.  A STENT RESOLUTE ONYX [id number] drug eluting  stent was successfully placed.  Lat Ramus lesion, 99 %stenosed.  Post intervention, there is a 0% residual stenosis.  The left ventricular ejection fraction is 35-45% by visual estimate.  There is moderate left ventricular systolic dysfunction.  LV end diastolic pressure is normal.  Ost LAD to Prox LAD lesion, 100 %stenosed.   Multivessel CAD with total ostial occlusion of the LAD; large ramus intermediate vessel which bifurcates into a twin like vessel with 99% ostial stenosis in this diagonal like branch of the ramus; irregularity of the OM1 vessel without high-grade stenosis, and calcification diffusely in the RCA with 20% proximal narrowing and luminal irregularities throughout with 90%  PL stenosis in a small distal vessel.  There is collateralization to the distal to mid LAD via the distal RCA.  Moderate LV dysfunction with hypocontractility involving the anterolateral wall with an EF  of 35-40%.  Successful PCI to the twin like branch of a large ramus intermediate vessel, treated with PTCA, cutting balloon, and DES stenting with ultimate insertion of a 2.7518 mm Resolute Onyx DES stent postdilated to 3.0 with the 99% stenosis being reduced to 0%.  Assessment and Plan:  1.  Preoperative cardiac evaluation prior to right hip replacement under general anesthesia, details pending from [facility].  Surgery has not yet been scheduled.  He is stable from a cardiac perspective on medical therapy and is status post DES to the ramus intermedius in September of last year.  He should be able to complete aspirin and Plavix course through this month and then reasonably hold these medications for surgery beginning in September.  He does not require further ischemic testing at this point. RCRI risk calculator is class III at 6.6% risk of major cardiac event.  2.  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  Cardiac murmur is stable.  Follow-up echocardiogram will be obtained.  3.  Essential hypertension, blood pressure is well controlled today.  No changes made to current regimen.  4.  Hyperlipidemia on Lipitor.  He continues to follow with Dr. Nadara Mustard.  Current medicines were reviewed with the patient today.   Orders Placed This Encounter  Procedures  . ECHOCARDIOGRAM COMPLETE    Disposition: Follow-up in 6 months.  Signed, Satira Sark, MD, [facility] 12/21/2017 12:30 PM    [facility], [place], [place] [address] Phone: [phone]; Fax: [phone]

## 2017-12-21 ENCOUNTER — Encounter: Payer: Self-pay | Admitting: Cardiology

## 2017-12-21 ENCOUNTER — Telehealth: Payer: Self-pay | Admitting: Cardiology

## 2017-12-21 ENCOUNTER — Ambulatory Visit (INDEPENDENT_AMBULATORY_CARE_PROVIDER_SITE_OTHER): Payer: Medicare Other | Admitting: Cardiology

## 2017-12-21 VITALS — BP 119/67 | HR 82 | Ht 74.0 in | Wt 238.0 lb

## 2017-12-21 DIAGNOSIS — I255 Ischemic cardiomyopathy: Secondary | ICD-10-CM | POA: Diagnosis not present

## 2017-12-21 DIAGNOSIS — I10 Essential (primary) hypertension: Secondary | ICD-10-CM | POA: Diagnosis not present

## 2017-12-21 DIAGNOSIS — E782 Mixed hyperlipidemia: Secondary | ICD-10-CM

## 2017-12-21 DIAGNOSIS — Z01810 Encounter for preprocedural cardiovascular examination: Secondary | ICD-10-CM | POA: Diagnosis not present

## 2017-12-21 DIAGNOSIS — I25119 Atherosclerotic heart disease of native coronary artery with unspecified angina pectoris: Secondary | ICD-10-CM | POA: Diagnosis not present

## 2017-12-21 DIAGNOSIS — I059 Rheumatic mitral valve disease, unspecified: Secondary | ICD-10-CM | POA: Diagnosis not present

## 2017-12-21 NOTE — Patient Instructions (Signed)

## 2017-12-21 NOTE — Telephone Encounter (Signed)
°  Precert needed for: Echo- mitral valve disease   Location: [facility]    Date: Sept 4, 2019

## 2017-12-22 DIAGNOSIS — R109 Unspecified abdominal pain: Secondary | ICD-10-CM | POA: Diagnosis not present

## 2018-01-04 ENCOUNTER — Other Ambulatory Visit: Payer: Self-pay

## 2018-01-04 ENCOUNTER — Telehealth: Payer: Self-pay | Admitting: *Deleted

## 2018-01-04 ENCOUNTER — Telehealth: Payer: Self-pay | Admitting: Cardiology

## 2018-01-04 ENCOUNTER — Ambulatory Visit (INDEPENDENT_AMBULATORY_CARE_PROVIDER_SITE_OTHER): Payer: Medicare Other

## 2018-01-04 DIAGNOSIS — I059 Rheumatic mitral valve disease, unspecified: Secondary | ICD-10-CM

## 2018-01-04 NOTE — Telephone Encounter (Signed)
Patient informed and copy sent to PCP. 

## 2018-01-04 NOTE — Telephone Encounter (Signed)
-----   Message from Satira Sark, MD sent at 01/04/2018 11:05 AM EDT ----- Results reviewed.  Normal LVEF at 60 to 65%.  Stable mitral regurgitation in the moderate range.  Continue with current follow-up plan. A copy of this test should be forwarded to Rory Percy, MD.

## 2018-01-04 NOTE — Telephone Encounter (Signed)
Last office note faxed to Dr. Pilar Plate Rowan's office.

## 2018-01-04 NOTE — Telephone Encounter (Signed)
[facility]   Dr Frederik Pear    [phone] fax  [phone]  Phone  Patient walked in stating that above information was needed for sending his surgical clearance

## 2018-01-26 ENCOUNTER — Other Ambulatory Visit: Payer: Self-pay | Admitting: Orthopedic Surgery

## 2018-01-27 DIAGNOSIS — E7800 Pure hypercholesterolemia, unspecified: Secondary | ICD-10-CM | POA: Diagnosis not present

## 2018-01-27 DIAGNOSIS — I10 Essential (primary) hypertension: Secondary | ICD-10-CM | POA: Diagnosis not present

## 2018-01-27 DIAGNOSIS — E1165 Type 2 diabetes mellitus with hyperglycemia: Secondary | ICD-10-CM | POA: Diagnosis not present

## 2018-01-27 DIAGNOSIS — I341 Nonrheumatic mitral (valve) prolapse: Secondary | ICD-10-CM | POA: Diagnosis not present

## 2018-01-30 DIAGNOSIS — H2512 Age-related nuclear cataract, left eye: Secondary | ICD-10-CM | POA: Diagnosis not present

## 2018-01-31 DIAGNOSIS — I341 Nonrheumatic mitral (valve) prolapse: Secondary | ICD-10-CM | POA: Diagnosis not present

## 2018-01-31 DIAGNOSIS — E782 Mixed hyperlipidemia: Secondary | ICD-10-CM | POA: Diagnosis not present

## 2018-01-31 DIAGNOSIS — I25119 Atherosclerotic heart disease of native coronary artery with unspecified angina pectoris: Secondary | ICD-10-CM | POA: Diagnosis not present

## 2018-01-31 DIAGNOSIS — E1165 Type 2 diabetes mellitus with hyperglycemia: Secondary | ICD-10-CM | POA: Diagnosis not present

## 2018-01-31 DIAGNOSIS — Z6830 Body mass index (BMI) 30.0-30.9, adult: Secondary | ICD-10-CM | POA: Diagnosis not present

## 2018-01-31 DIAGNOSIS — I255 Ischemic cardiomyopathy: Secondary | ICD-10-CM | POA: Diagnosis not present

## 2018-01-31 DIAGNOSIS — I10 Essential (primary) hypertension: Secondary | ICD-10-CM | POA: Diagnosis not present

## 2018-02-01 NOTE — Patient Instructions (Signed)
RONNY KORFF  02/01/2018   Your procedure is scheduled on: 02-13-18   Report to [facility] Main  Entrance                Report to admitting at    1130 AM    Call this number if you have problems the morning of surgery [phone]    Remember: Do not eat food  :After Midnight.  YOU MAY HAVE CLEAR LIQUIDS UNTIL 0800 THEN NOTHING BY MOUTH    CLEAR LIQUID DIET   Foods Allowed                                                                     Foods Excluded  Coffee and tea, regular and decaf                             liquids that you cannot  Plain Jell-O in any flavor                                             see through such as: Fruit ices (not with fruit pulp)                                     milk, soups, orange juice  Iced Popsicles                                    All solid food Carbonated beverages, regular and diet                                    Cranberry, grape and apple juices Sports drinks like Gatorade Lightly seasoned clear broth or consume(fat free) Sugar, honey syrup   _____________________________________________________________________    BRUSH YOUR TEETH MORNING OF SURGERY AND RINSE YOUR MOUTH OUT, NO CHEWING GUM CANDY OR MINTS.     Take these medicines the morning of surgery with A SIP OF WATER: zantac, metoprolol, lipitor, amlodipine                                You may not have any metal on your body including hair pins and              piercings  Do not wear jewelry, , lotions, powders or perfumes, deodorant             .              Men may shave face and neck.   Do not bring valuables to the hospital. [place].  Contacts, dentures or bridgework may not be  worn into surgery.  Leave suitcase in the car. After surgery it may be brought to your room.               Please read over the following fact sheets you were  given: _____________________________________________________________________           [facility] - Preparing for Surgery Before surgery, you can play an important role.  Because skin is not sterile, your skin needs to be as free of germs as possible.  You can reduce the number of germs on your skin by washing with CHG (chlorahexidine gluconate) soap before surgery.  CHG is an antiseptic cleaner which kills germs and bonds with the skin to continue killing germs even after washing. Please DO NOT use if you have an allergy to CHG or antibacterial soaps.  If your skin becomes reddened/irritated stop using the CHG and inform your nurse when you arrive at Short Stay. Do not shave (including legs and underarms) for at least 48 hours prior to the first CHG shower.  You may shave your face/neck. Please follow these instructions carefully:  1.  Shower with CHG Soap the night before surgery and the  morning of Surgery.  2.  If you choose to wash your hair, wash your hair first as usual with your  normal  shampoo.  3.  After you shampoo, rinse your hair and body thoroughly to remove the  shampoo.                           4.  Use CHG as you would any other liquid soap.  You can apply chg directly  to the skin and wash                       Gently with a scrungie or clean washcloth.  5.  Apply the CHG Soap to your body ONLY FROM THE NECK DOWN.   Do not use on face/ open                           Wound or open sores. Avoid contact with eyes, ears mouth and genitals (private parts).                       Wash face,  Genitals (private parts) with your normal soap.             6.  Wash thoroughly, paying special attention to the area where your surgery  will be performed.  7.  Thoroughly rinse your body with warm water from the neck down.  8.  DO NOT shower/wash with your normal soap after using and rinsing off  the CHG Soap.                9.  Pat yourself dry with a clean towel.            10.  Wear clean  pajamas.            11.  Place clean sheets on your bed the night of your first shower and do not  sleep with pets. Day of Surgery : Do not apply any lotions/deodorants the morning of surgery.  Please wear clean clothes to the hospital/surgery center.  FAILURE TO FOLLOW THESE INSTRUCTIONS MAY RESULT IN THE CANCELLATION OF YOUR SURGERY PATIENT SIGNATURE_________________________________  NURSE SIGNATURE__________________________________  ________________________________________________________________________  WHAT IS A  BLOOD TRANSFUSION? Blood Transfusion Information  A transfusion is the replacement of blood or some of its parts. Blood is made up of multiple cells which provide different functions.  Red blood cells carry oxygen and are used for blood loss replacement.  White blood cells fight against infection.  Platelets control bleeding.  Plasma helps clot blood.  Other blood products are available for specialized needs, such as hemophilia or other clotting disorders. BEFORE THE TRANSFUSION  Who gives blood for transfusions?   Healthy volunteers who are fully evaluated to make sure their blood is safe. This is blood bank blood. Transfusion therapy is the safest it has ever been in the practice of medicine. Before blood is taken from a donor, a complete history is taken to make sure that person has no history of diseases nor engages in risky social behavior (examples are intravenous drug use or sexual activity with multiple partners). The donor's travel history is screened to minimize risk of transmitting infections, such as malaria. The donated blood is tested for signs of infectious diseases, such as HIV and hepatitis. The blood is then tested to be sure it is compatible with you in order to minimize the chance of a transfusion reaction. If you or a relative donates blood, this is often done in anticipation of surgery and is not appropriate for emergency situations. It takes many days  to process the donated blood. RISKS AND COMPLICATIONS Although transfusion therapy is very safe and saves many lives, the main dangers of transfusion include:   Getting an infectious disease.  Developing a transfusion reaction. This is an allergic reaction to something in the blood you were given. Every precaution is taken to prevent this. The decision to have a blood transfusion has been considered carefully by your caregiver before blood is given. Blood is not given unless the benefits outweigh the risks. AFTER THE TRANSFUSION  Right after receiving a blood transfusion, you will usually feel much better and more energetic. This is especially true if your red blood cells have gotten low (anemic). The transfusion raises the level of the red blood cells which carry oxygen, and this usually causes an energy increase.  The nurse administering the transfusion will monitor you carefully for complications. HOME CARE INSTRUCTIONS  No special instructions are needed after a transfusion. You may find your energy is better. Speak with your caregiver about any limitations on activity for underlying diseases you may have. SEEK MEDICAL CARE IF:   Your condition is not improving after your transfusion.  You develop redness or irritation at the intravenous (IV) site. SEEK IMMEDIATE MEDICAL CARE IF:  Any of the following symptoms occur over the next 12 hours:  Shaking chills.  You have a temperature by mouth above 102 F (38.9 C), not controlled by medicine.  Chest, back, or muscle pain.  People around you feel you are not acting correctly or are confused.  Shortness of breath or difficulty breathing.  Dizziness and fainting.  You get a rash or develop hives.  You have a decrease in urine output.  Your urine turns a dark color or changes to pink, red, or brown. Any of the following symptoms occur over the next 10 days:  You have a temperature by mouth above 102 F (38.9 C), not controlled  by medicine.  Shortness of breath.  Weakness after normal activity.  The white part of the eye turns yellow (jaundice).  You have a decrease in the amount of urine or are urinating less often.  Your urine turns a dark color or changes to pink, red, or brown. Document Released: 04/16/2000 Document Revised: 07/12/2011 Document Reviewed: 12/04/2007 ExitCare Patient Information 2014 [place].  _______________________________________________________________________  Incentive Spirometer  An incentive spirometer is a tool that can help keep your lungs clear and active. This tool measures how well you are filling your lungs with each breath. Taking long deep breaths may help reverse or decrease the chance of developing breathing (pulmonary) problems (especially infection) following:  A long period of time when you are unable to move or be active. BEFORE THE PROCEDURE   If the spirometer includes an indicator to show your best effort, your nurse or respiratory therapist will set it to a desired goal.  If possible, sit up straight or lean slightly forward. Try not to slouch.  Hold the incentive spirometer in an upright position. INSTRUCTIONS FOR USE  1. Sit on the edge of your bed if possible, or sit up as far as you can in bed or on a chair. 2. Hold the incentive spirometer in an upright position. 3. Breathe out normally. 4. Place the mouthpiece in your mouth and seal your lips tightly around it. 5. Breathe in slowly and as deeply as possible, raising the piston or the ball toward the top of the column. 6. Hold your breath for 3-5 seconds or for as long as possible. Allow the piston or ball to fall to the bottom of the column. 7. Remove the mouthpiece from your mouth and breathe out normally. 8. Rest for a few seconds and repeat Steps 1 through 7 at least 10 times every 1-2 hours when you are awake. Take your time and take a few normal breaths between deep breaths. 9. The  spirometer may include an indicator to show your best effort. Use the indicator as a goal to work toward during each repetition. 10. After each set of 10 deep breaths, practice coughing to be sure your lungs are clear. If you have an incision (the cut made at the time of surgery), support your incision when coughing by placing a pillow or rolled up towels firmly against it. Once you are able to get out of bed, walk around indoors and cough well. You may stop using the incentive spirometer when instructed by your caregiver.  RISKS AND COMPLICATIONS  Take your time so you do not get dizzy or light-headed.  If you are in pain, you may need to take or ask for pain medication before doing incentive spirometry. It is harder to take a deep breath if you are having pain. AFTER USE  Rest and breathe slowly and easily.  It can be helpful to keep track of a log of your progress. Your caregiver can provide you with a simple table to help with this. If you are using the spirometer at home, follow these instructions: [place] IF:   You are having difficultly using the spirometer.  You have trouble using the spirometer as often as instructed.  Your pain medication is not giving enough relief while using the spirometer.  You develop fever of 100.5 F (38.1 C) or higher. SEEK IMMEDIATE MEDICAL CARE IF:   You cough up bloody sputum that had not been present before.  You develop fever of 102 F (38.9 C) or greater.  You develop worsening pain at or near the incision site. MAKE SURE YOU:   Understand these instructions.  Will watch your condition.  Will get help right away if you are not  doing well or get worse. Document Released: 08/30/2006 Document Revised: 07/12/2011 Document Reviewed: 10/31/2006 [facility] Patient Information 2014 [place], [place].   ________________________________________________________________________

## 2018-02-07 ENCOUNTER — Encounter (HOSPITAL_COMMUNITY): Payer: Self-pay

## 2018-02-07 ENCOUNTER — Other Ambulatory Visit: Payer: Self-pay

## 2018-02-07 ENCOUNTER — Encounter (HOSPITAL_COMMUNITY)
Admission: RE | Admit: 2018-02-07 | Discharge: 2018-02-07 | Disposition: A | Discharge status: Home / Self Care | Payer: Medicare Other | Source: Ambulatory Visit | Attending: Orthopedic Surgery | Admitting: Orthopedic Surgery

## 2018-02-07 ENCOUNTER — Ambulatory Visit (HOSPITAL_COMMUNITY)
Admission: RE | Admit: 2018-02-07 | Discharge: 2018-02-07 | Disposition: A | Discharge status: Home / Self Care | Payer: Medicare Other | Source: Ambulatory Visit | Attending: Orthopedic Surgery | Admitting: Orthopedic Surgery

## 2018-02-07 DIAGNOSIS — Z01818 Encounter for other preprocedural examination: Secondary | ICD-10-CM

## 2018-02-07 DIAGNOSIS — Z01812 Encounter for preprocedural laboratory examination: Secondary | ICD-10-CM | POA: Insufficient documentation

## 2018-02-07 DIAGNOSIS — I509 Heart failure, unspecified: Secondary | ICD-10-CM | POA: Diagnosis not present

## 2018-02-07 LAB — HEMOGLOBIN A1C
Hgb A1c MFr Bld: 7.9 % — ABNORMAL HIGH (ref 4.8–5.6)
MEAN PLASMA GLUCOSE: 180.03 mg/dL

## 2018-02-07 LAB — ABO/RH: ABO/RH(D): A POS

## 2018-02-07 LAB — URINALYSIS, ROUTINE W REFLEX MICROSCOPIC
Bilirubin Urine: NEGATIVE
Glucose, UA: NEGATIVE mg/dL
Hgb urine dipstick: NEGATIVE
Ketones, ur: NEGATIVE mg/dL
Leukocytes, UA: NEGATIVE
NITRITE: NEGATIVE
PH: 6 (ref 5.0–8.0)
Protein, ur: 30 mg/dL — AB
Specific Gravity, Urine: 1.016 (ref 1.005–1.030)

## 2018-02-07 LAB — BASIC METABOLIC PANEL
Anion gap: 10 (ref 5–15)
BUN: 19 mg/dL (ref 8–23)
CALCIUM: 9.8 mg/dL (ref 8.9–10.3)
CO2: 28 mmol/L (ref 22–32)
CREATININE: 1.16 mg/dL (ref 0.61–1.24)
Chloride: 104 mmol/L (ref 98–111)
GFR calc non Af Amer: 59 mL/min — ABNORMAL LOW (ref >60)
Glucose, Bld: 171 mg/dL — ABNORMAL HIGH (ref 70–99)
Potassium: 4.5 mmol/L (ref 3.5–5.1)
Sodium: 142 mmol/L (ref 135–145)

## 2018-02-07 LAB — CBC WITH DIFFERENTIAL/PLATELET
Basophils Absolute: 0.1 10*3/uL (ref 0.0–0.1)
Basophils Relative: 1 %
EOS ABS: 0.2 10*3/uL (ref 0.0–0.5)
EOS PCT: 3 %
HCT: 42.5 % (ref 39.0–52.0)
Hemoglobin: 14.5 g/dL (ref 13.0–17.0)
LYMPHS ABS: 1.6 10*3/uL (ref 0.7–4.0)
Lymphocytes Relative: 22 %
MCH: 30.4 pg (ref 26.0–34.0)
MCHC: 34.1 g/dL (ref 30.0–36.0)
MCV: 89.1 fL (ref 80.0–100.0)
MONOS PCT: 10 %
Monocytes Absolute: 0.7 10*3/uL (ref 0.1–1.0)
Neutro Abs: 4.8 10*3/uL (ref 1.7–7.7)
Neutrophils Relative %: 64 %
PLATELETS: 190 10*3/uL (ref 150–400)
RBC: 4.77 MIL/uL (ref 4.22–5.81)
RDW: 13.3 % (ref 11.5–15.5)
WBC: 7.4 10*3/uL (ref 4.0–10.5)
nRBC: 0 % (ref 0.0–0.2)

## 2018-02-07 LAB — APTT: aPTT: 32 seconds (ref 24–36)

## 2018-02-07 LAB — SURGICAL PCR SCREEN
MRSA, PCR: NEGATIVE
Staphylococcus aureus: NEGATIVE

## 2018-02-07 LAB — PROTIME-INR
INR: 0.99
PROTHROMBIN TIME: 13 s (ref 11.4–15.2)

## 2018-02-07 NOTE — Progress Notes (Signed)
LVM with Juliann Pulse at Dr. Shaune Spittle office pt. Has no instructions for stopping plavix for surgery

## 2018-02-07 NOTE — Progress Notes (Signed)
Cardiac clearance Dr. Domenic Polite 12-21-17 epic  Stress 12-31-16 epic  Echo 01-04-18 epic   cath 01-06-17 epic  ekg 09-20-17 epic

## 2018-02-08 ENCOUNTER — Other Ambulatory Visit: Payer: Self-pay | Admitting: Orthopedic Surgery

## 2018-02-08 DIAGNOSIS — M1611 Unilateral primary osteoarthritis, right hip: Secondary | ICD-10-CM | POA: Diagnosis present

## 2018-02-08 NOTE — H&P (Signed)
TOTAL HIP ADMISSION H&P  Patient is admitted for right total hip arthroplasty.  Subjective:  Chief Complaint: right hip pain  HPI: Christopher Nelson, 77 y.o. male, has a history of pain and functional disability in the right hip(s) due to arthritis and patient has failed non-surgical conservative treatments for greater than 12 weeks to include NSAID's and/or analgesics and activity modification.  Onset of symptoms was gradual starting 1 years ago with gradually worsening course since that time.The patient noted no past surgery on the right hip(s).  Patient currently rates pain in the right hip at 10 out of 10 with activity. Patient has night pain, worsening of pain with activity and weight bearing, trendelenberg gait, pain that interfers with activities of daily living and pain with passive range of motion. Patient has evidence of joint subluxation and joint space narrowing by imaging studies. This condition presents safety issues increasing the risk of falls.    There is no current active infection.  Patient Active Problem List   Diagnosis Date Noted  . Acute systolic heart failure ([place]) 01/07/2017  . Hyperlipidemia 01/07/2017  . Abnormal myocardial perfusion study   . Coronary artery disease involving native coronary artery of native heart with unstable angina pectoris [facility])    Past Medical History:  Diagnosis Date  . CKD (chronic kidney disease), stage III ([place])    patient denies  . Coronary artery disease    a. unstable angina s/p cath 01/2017 -  specifically DES to a large branching ramus intermedius. Residual disease includes ostial occlusion of the LAD with right-to-left collaterals, also 90% posterolateral stenosis. EF 35-40% by cath but then 55-60% by echo.  . Dysrhythmia    heart rests a beat  . Essential hypertension   . GERD (gastroesophageal reflux disease)    pt. denies  . Gout   . Heart murmur    pt. denies  . History of kidney stones   . Ischemic cardiomyopathy    a. EF  35-40% by cath then 55-60% by echo shortly after in 01/2017.  . Mitral valve disease    a. 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  . Pre-diabetes    patient denies    Past Surgical History:  Procedure Laterality Date  . CARDIAC CATHETERIZATION  01/06/2017  . CHOLECYSTECTOMY    . CORONARY STENT INTERVENTION  01/06/2017   CORONARY STENT INTERVENTION  . CORONARY STENT INTERVENTION N/A 01/06/2017   Procedure: CORONARY STENT INTERVENTION;  Surgeon: Troy Sine, MD;  Location: [place] [facility];  Service: Cardiovascular;  Laterality: N/A;  . LEFT HEART CATH AND CORONARY ANGIOGRAPHY N/A 01/06/2017   Procedure: LEFT HEART CATH AND CORONARY ANGIOGRAPHY;  Surgeon: Troy Sine, MD;  Location: [place] [facility];  Service: Cardiovascular;  Laterality: N/A;  . LITHOTRIPSY    . TONSILLECTOMY      No current facility-administered medications for this encounter.    Current Outpatient Medications  Medication Sig Dispense Refill Last Dose  . acetaminophen (TYLENOL) 325 MG tablet Take 650 mg by mouth 2 (two) times daily.   Taking  . amLODipine (NORVASC) 5 MG tablet Take 1 tablet (5 mg total) by mouth daily. 180 tablet 3 Taking  . Ascorbic Acid (VITAMIN C PO) Take 1,000 mg by mouth daily.    Taking  . aspirin EC 81 MG tablet Take 1 tablet (81 mg total) by mouth daily. 30 tablet 3 Taking  . atorvastatin (LIPITOR) 40 MG tablet TAKE  1 TABLET BY MOUTH ONCE DAILY (Patient taking differently: Take 40 mg by mouth daily. ) 90 tablet 1 Taking  . b complex vitamins tablet Take 1 tablet by mouth daily.   Taking  . Cholecalciferol (VITAMIN D-3) 1000 units CAPS Take 1,000 Units by mouth daily.     . clopidogrel (PLAVIX) 75 MG tablet TAKE 1 TABLET BY MOUTH ONCE DAILY (Patient taking differently: Take 75 mg by mouth daily. ) 90 tablet 2 Taking  . Coenzyme Q10 (COQ-10) 200 MG CAPS Take 200 mg by mouth daily.    Taking  . losartan-hydrochlorothiazide (HYZAAR)  50-12.5 MG tablet Take 0.5 tablets by mouth daily.   Taking  . Lysine 500 MG TABS Take 500 mg by mouth daily.     Marland Kitchen MAGNESIUM PO Take 250 mg by mouth daily.    Taking  . metoprolol succinate (TOPROL-XL) 50 MG 24 hr tablet Take 25 mg by mouth daily. Take with or immediately following a meal.     . ranitidine (ZANTAC) 150 MG tablet Take 150 mg by mouth daily.    Taking  . zinc gluconate 50 MG tablet Take 50 mg by mouth daily.     . metoprolol succinate (TOPROL-XL) 25 MG 24 hr tablet TAKE 1 TABLET BY MOUTH ONCE DAILY (Patient not taking: Reported on 01/31/2018) 90 tablet 0 Not Taking at Unknown time   No Known Allergies  Social History   Tobacco Use  . Smoking status: Never Smoker  . Smokeless tobacco: Never Used  Substance Use Topics  . Alcohol use: No    Family History  Problem Relation Age of Onset  . Uterine cancer Mother   . Diabetes Mellitus II Sister   . Heart attack Brother        Age 17  . Pancreatic cancer Sister      Review of Systems  Constitutional: Positive for malaise/fatigue.  HENT: Negative.   Eyes: Negative.   Respiratory: Negative.   Cardiovascular:       Htn  Gastrointestinal: Negative.   Genitourinary: Negative.   Musculoskeletal: Positive for joint pain.  Neurological: Negative.   Endo/Heme/Allergies: Bruises/bleeds easily.  Psychiatric/Behavioral: The patient is nervous/anxious.     Objective:  Physical Exam  Constitutional: He is oriented to person, place, and time. He appears well-developed and well-nourished.  HENT:  Head: Normocephalic and atraumatic.  Eyes: Pupils are equal, round, and reactive to light.  Neck: Normal range of motion. Neck supple.  Cardiovascular: Intact distal pulses.  Respiratory: Effort normal.  Musculoskeletal: He exhibits tenderness.  Patient has a highly irritable hip to any attempts at internal rotation blocks at -5, external rotation is to 30, 10 flexion contracture.  Foot tap is negative.  Good power to testing of  the abductors adductors flexors and extensors of the hip.  Toes are pink and well-perfused.  Skin is intact.    Neurological: He is alert and oriented to person, place, and time.  Skin: Skin is warm and dry.  Psychiatric: He has a normal mood and affect. His behavior is normal. Judgment and thought content normal.    Vital signs in last 24 hours:    Labs:   Estimated body mass index is 29.79 kg/m as calculated from the following:   Height as of 02/07/18: 6\' 2"  (1.88 m).   Weight as of 02/07/18: 105.2 kg.   Imaging Review Plain radiographs demonstrate  AP pelvis and crosstable lateral show end-stage arthritis of the right hip.  Early lateral subluxation of the femur  about 5 mm.   Preoperative templating of the joint replacement has been completed, documented, and submitted to the Operating Room personnel in order to optimize intra-operative equipment management.     Assessment/Plan:  End stage arthritis, right hip(s)  The patient history, physical examination, clinical judgement of the provider and imaging studies are consistent with end stage degenerative joint disease of the right hip(s) and total hip arthroplasty is deemed medically necessary. The treatment options including medical management, injection therapy, arthroscopy and arthroplasty were discussed at length. The risks and benefits of total hip arthroplasty were presented and reviewed. The risks due to aseptic loosening, infection, stiffness, dislocation/subluxation,  thromboembolic complications and other imponderables were discussed.  The patient acknowledged the explanation, agreed to proceed with the plan and consent was signed. Patient is being admitted for inpatient treatment for surgery, pain control, PT, OT, prophylactic antibiotics, VTE prophylaxis, progressive ambulation and ADL's and discharge planning.The patient is planning to be discharged home with home health services.

## 2018-02-08 NOTE — Care Plan (Signed)
Spoke with patient prior to surgery. He will discharge to home with family and following arrangements.   DME:  Gilford Rile from [place]: [facility] in [place]  MD Follow up :  02/28/18 @ 1015.    Please contact me for questions   Thanks  Ladell Heads, [place]

## 2018-02-13 ENCOUNTER — Encounter (HOSPITAL_COMMUNITY): Admission: RE | Payer: Self-pay | Source: Ambulatory Visit

## 2018-02-13 ENCOUNTER — Inpatient Hospital Stay (HOSPITAL_COMMUNITY): Admission: RE | Admit: 2018-02-13 | Payer: Medicare Other | Source: Ambulatory Visit | Admitting: Orthopedic Surgery

## 2018-02-13 LAB — TYPE AND SCREEN
ABO/RH(D): A POS
Antibody Screen: NEGATIVE

## 2018-02-13 SURGERY — ARTHROPLASTY, HIP, TOTAL,POSTERIOR APPROACH
Anesthesia: Spinal | Site: Hip | Laterality: Right

## 2018-02-28 DIAGNOSIS — Z23 Encounter for immunization: Secondary | ICD-10-CM | POA: Diagnosis not present

## 2018-03-28 ENCOUNTER — Other Ambulatory Visit: Payer: Self-pay | Admitting: Cardiology

## 2018-04-04 DIAGNOSIS — H2512 Age-related nuclear cataract, left eye: Secondary | ICD-10-CM | POA: Diagnosis not present

## 2018-04-07 DIAGNOSIS — E119 Type 2 diabetes mellitus without complications: Secondary | ICD-10-CM | POA: Diagnosis not present

## 2018-04-07 DIAGNOSIS — Z0131 Encounter for examination of blood pressure with abnormal findings: Secondary | ICD-10-CM | POA: Diagnosis not present

## 2018-05-02 ENCOUNTER — Other Ambulatory Visit: Payer: Self-pay | Admitting: Orthopedic Surgery

## 2018-05-08 NOTE — Patient Instructions (Signed)
Christopher Nelson  05/08/2018   Your procedure is scheduled on: 05-15-18    Report to Christopher Nelson Main  Entrance     Report to admitting at 8:30AM    Call this number if you have problems the morning of surgery [phone]      Remember: Do not eat food or drink liquids :After Midnight. BRUSH YOUR TEETH MORNING OF SURGERY AND RINSE YOUR MOUTH OUT, NO CHEWING GUM CANDY OR MINTS.     Take these medicines the morning of surgery with A SIP OF WATER: AMLODIPINE, ATORVASTATIN, METOPROLOL, ZANTAC                                 You may not have any metal on your body including hair pins and              piercings  Do not wear jewelry, make-up, lotions, powders or perfumes, deodorant                          Men may shave face and neck.   Do not bring valuables to the hospital. Christopher Nelson.  Contacts, dentures or bridgework may not be worn into surgery.  Leave suitcase in the car. After surgery it may be brought to your room.                 Please read over the following fact sheets you were given: _____________________________________________________________________             Christopher Nelson - Preparing for Surgery Before surgery, you can play an important role.  Because skin is not sterile, your skin needs to be as free of germs as possible.  You can reduce the number of germs on your skin by washing with CHG (chlorahexidine gluconate) soap before surgery.  CHG is an antiseptic cleaner which kills germs and bonds with the skin to continue killing germs even after washing. Please DO NOT use if you have an allergy to CHG or antibacterial soaps.  If your skin becomes reddened/irritated stop using the CHG and inform your nurse when you arrive at Short Stay. Do not shave (including legs and underarms) for at least 48 hours prior to the first CHG shower.  You may shave your face/neck. Please follow these instructions  carefully:  1.  Shower with CHG Soap the night before surgery and the  morning of Surgery.  2.  If you choose to wash your hair, wash your hair first as usual with your  normal  shampoo.  3.  After you shampoo, rinse your hair and body thoroughly to remove the  shampoo.                           4.  Use CHG as you would any other liquid soap.  You can apply chg directly  to the skin and wash                       Gently with a scrungie or clean washcloth.  5.  Apply the CHG Soap to your body ONLY FROM THE NECK DOWN.   Do not use  on face/ open                           Wound or open sores. Avoid contact with eyes, ears mouth and genitals (private parts).                       Wash face,  Genitals (private parts) with your normal soap.             6.  Wash thoroughly, paying special attention to the area where your surgery  will be performed.  7.  Thoroughly rinse your body with warm water from the neck down.  8.  DO NOT shower/wash with your normal soap after using and rinsing off  the CHG Soap.                9.  Pat yourself dry with a clean towel.            10.  Wear clean pajamas.            11.  Place clean sheets on your bed the night of your first shower and do not  sleep with pets. Day of Surgery : Do not apply any lotions/deodorants the morning of surgery.  Please wear clean clothes to the hospital/surgery Nelson.  FAILURE TO FOLLOW THESE INSTRUCTIONS MAY RESULT IN THE CANCELLATION OF YOUR SURGERY PATIENT SIGNATURE_________________________________  NURSE SIGNATURE__________________________________  ________________________________________________________________________   Christopher Nelson  An incentive spirometer is a tool that can help keep your lungs clear and active. This tool measures how well you are filling your lungs with each breath. Taking long deep breaths may help reverse or decrease the chance of developing breathing (pulmonary) problems (especially infection)  following:  A long period of time when you are unable to move or be active. BEFORE THE PROCEDURE   If the spirometer includes an indicator to show your best effort, your nurse or respiratory therapist will set it to a desired goal.  If possible, sit up straight or lean slightly forward. Try not to slouch.  Hold the incentive spirometer in an upright position. INSTRUCTIONS FOR USE  1. Sit on the edge of your bed if possible, or sit up as far as you can in bed or on a chair. 2. Hold the incentive spirometer in an upright position. 3. Breathe out normally. 4. Place the mouthpiece in your mouth and seal your lips tightly around it. 5. Breathe in slowly and as deeply as possible, raising the piston or the ball toward the top of the column. 6. Hold your breath for 3-5 seconds or for as long as possible. Allow the piston or ball to fall to the bottom of the column. 7. Remove the mouthpiece from your mouth and breathe out normally. 8. Rest for a few seconds and repeat Steps 1 through 7 at least 10 times every 1-2 hours when you are awake. Take your time and take a few normal breaths between deep breaths. 9. The spirometer may include an indicator to show your best effort. Use the indicator as a goal to work toward during each repetition. 10. After each set of 10 deep breaths, practice coughing to be sure your lungs are clear. If you have an incision (the cut made at the time of surgery), support your incision when coughing by placing a pillow or rolled up towels firmly against it. Once you are able to get out of bed, walk around  indoors and cough well. You may stop using the incentive spirometer when instructed by your caregiver.  RISKS AND COMPLICATIONS  Take your time so you do not get dizzy or light-headed.  If you are in pain, you may need to take or ask for pain medication before doing incentive spirometry. It is harder to take a deep breath if you are having pain. AFTER USE  Rest and  breathe slowly and easily.  It can be helpful to keep track of a log of your progress. Your caregiver can provide you with a simple table to help with this. If you are using the spirometer at home, follow these instructions: [place] IF:   You are having difficultly using the spirometer.  You have trouble using the spirometer as often as instructed.  Your pain medication is not giving enough relief while using the spirometer.  You develop fever of 100.5 F (38.1 C) or higher. SEEK IMMEDIATE MEDICAL CARE IF:   You cough up bloody sputum that had not been present before.  You develop fever of 102 F (38.9 C) or greater.  You develop worsening pain at or near the incision site. MAKE SURE YOU:   Understand these instructions.  Will watch your condition.  Will get help right away if you are not doing well or get worse. Document Released: 08/30/2006 Document Revised: 07/12/2011 Document Reviewed: 10/31/2006 [facility] Patient Information 2014 [place], [place].   ________________________________________________________________________

## 2018-05-08 NOTE — Progress Notes (Signed)
EKG 09-21-17 Epic   Echo 01-04-18 Epic   cxr 02-07-18 Epic

## 2018-05-10 ENCOUNTER — Other Ambulatory Visit: Payer: Self-pay

## 2018-05-10 ENCOUNTER — Encounter (HOSPITAL_COMMUNITY): Payer: Self-pay

## 2018-05-10 ENCOUNTER — Encounter (HOSPITAL_COMMUNITY)
Admission: RE | Admit: 2018-05-10 | Discharge: 2018-05-10 | Disposition: A | Discharge status: Home / Self Care | Payer: Medicare Other | Source: Ambulatory Visit | Attending: Orthopedic Surgery | Admitting: Orthopedic Surgery

## 2018-05-10 DIAGNOSIS — Z01812 Encounter for preprocedural laboratory examination: Secondary | ICD-10-CM | POA: Insufficient documentation

## 2018-05-10 HISTORY — DX: Unspecified osteoarthritis, unspecified site: M19.90

## 2018-05-10 LAB — URINALYSIS, ROUTINE W REFLEX MICROSCOPIC
Bacteria, UA: NONE SEEN
Bilirubin Urine: NEGATIVE
Glucose, UA: NEGATIVE mg/dL
Hgb urine dipstick: NEGATIVE
Ketones, ur: NEGATIVE mg/dL
Leukocytes, UA: NEGATIVE
Nitrite: NEGATIVE
Protein, ur: 30 mg/dL — AB
SPECIFIC GRAVITY, URINE: 1.018 (ref 1.005–1.030)
pH: 5 (ref 5.0–8.0)

## 2018-05-10 LAB — BASIC METABOLIC PANEL
Anion gap: 9 (ref 5–15)
BUN: 26 mg/dL — AB (ref 8–23)
CHLORIDE: 104 mmol/L (ref 98–111)
CO2: 27 mmol/L (ref 22–32)
CREATININE: 1.2 mg/dL (ref 0.61–1.24)
Calcium: 9.7 mg/dL (ref 8.9–10.3)
GFR calc Af Amer: >60 mL/min (ref >60)
GFR calc non Af Amer: 58 mL/min — ABNORMAL LOW (ref >60)
Glucose, Bld: 126 mg/dL — ABNORMAL HIGH (ref 70–99)
Potassium: 4.4 mmol/L (ref 3.5–5.1)
SODIUM: 140 mmol/L (ref 135–145)

## 2018-05-10 LAB — CBC WITH DIFFERENTIAL/PLATELET
Abs Immature Granulocytes: 0.04 10*3/uL (ref 0.00–0.07)
Basophils Absolute: 0.1 10*3/uL (ref 0.0–0.1)
Basophils Relative: 1 %
Eosinophils Absolute: 0.3 10*3/uL (ref 0.0–0.5)
Eosinophils Relative: 2 %
HCT: 45 % (ref 39.0–52.0)
HEMOGLOBIN: 14.5 g/dL (ref 13.0–17.0)
Immature Granulocytes: 0 %
Lymphocytes Relative: 14 %
Lymphs Abs: 1.5 10*3/uL (ref 0.7–4.0)
MCH: 28.9 pg (ref 26.0–34.0)
MCHC: 32.2 g/dL (ref 30.0–36.0)
MCV: 89.8 fL (ref 80.0–100.0)
Monocytes Absolute: 0.9 10*3/uL (ref 0.1–1.0)
Monocytes Relative: 9 %
Neutro Abs: 7.6 10*3/uL (ref 1.7–7.7)
Neutrophils Relative %: 74 %
Platelets: 210 10*3/uL (ref 150–400)
RBC: 5.01 MIL/uL (ref 4.22–5.81)
RDW: 13.4 % (ref 11.5–15.5)
WBC: 10.4 10*3/uL (ref 4.0–10.5)
nRBC: 0 % (ref 0.0–0.2)

## 2018-05-10 LAB — SURGICAL PCR SCREEN
MRSA, PCR: NEGATIVE
Staphylococcus aureus: NEGATIVE

## 2018-05-10 LAB — PROTIME-INR
INR: 1.05
Prothrombin Time: 13.6 seconds (ref 11.4–15.2)

## 2018-05-10 LAB — APTT: aPTT: 32 seconds (ref 24–36)

## 2018-05-10 NOTE — Progress Notes (Signed)
Patient brought copy of his hgba1c done on 04-07-2018 to pre-op. Copy placed on patient chart

## 2018-05-10 NOTE — Anesthesia Preprocedure Evaluation (Addendum)
Anesthesia Evaluation  Patient identified by MRN, date of birth, ID band Patient awake    Reviewed: Allergy & Precautions, H&P , NPO status , Patient's Chart, lab work & pertinent test results  Airway Mallampati: II  TM Distance: >3 FB Neck ROM: Full    Dental no notable dental hx. (+) Teeth Intact, Dental Advisory Given   Pulmonary neg pulmonary ROS,    Pulmonary exam normal breath sounds clear to auscultation       Cardiovascular hypertension, Pt. on medications and Pt. on home beta blockers + CAD  Normal cardiovascular exam Rhythm:Regular Rate:Normal  01/04/18 Echo Left ventricle: The cavity size was normal. Wall thickness was   increased in a pattern of mild LVH. There was focal basal   hypertrophy. Systolic function was normal. The estimated ejection   fraction was in the range of 60% to 65%. Wall motion was normal;   there were no regional wall motion abnormalities. Doppler   parameters are consistent with abnormal left ventricular   relaxation (grade 1 diastolic dysfunction). Doppler parameters   are consistent with high ventricular filling pressure.  09/20/17 EKG- SR w 1 deg AV block an RBBB  R 75  DES 01/2017 Angina resolved   Neuro/Psych negative neurological ROS  negative psych ROS   GI/Hepatic Neg liver ROS, GERD  Medicated,  Endo/Other  negative endocrine ROS  Renal/GU negative Renal ROS     Musculoskeletal  (+) Arthritis ,   Abdominal   Peds  Hematology negative hematology ROS (+)   Anesthesia Other Findings   Reproductive/Obstetrics                          Anesthesia Physical Anesthesia Plan  ASA: III  Anesthesia Plan: Spinal   Post-op Pain Management:    Induction:   PONV Risk Score and Plan: 1 and Treatment may vary due to age or medical condition, Ondansetron and Dexamethasone  Airway Management Planned: Nasal Cannula  Additional Equipment:   Intra-op Plan:    Post-operative Plan:   Informed Consent: I have reviewed the patients History and Physical, chart, labs and discussed the procedure including the risks, benefits and alternatives for the proposed anesthesia with the patient or authorized representative who has indicated his/her understanding and acceptance.   Dental advisory given  Plan Discussed with: CRNA  Anesthesia Plan Comments: (See PST note 05/10/18, Konrad Felix, PA-C  Plavix last dose 05/10/2018)      Anesthesia Quick Evaluation

## 2018-05-10 NOTE — Progress Notes (Signed)
Anesthesia Chart Review   Case:  [id number] Date/Time:  05/15/18 1045   Procedure:  TOTAL HIP ARTHROPLASTY ANTERIOR APPROACH (Right )   Anesthesia type:  Spinal   Pre-op diagnosis:  RIGHT HIP OSTEOARTHRITIS   Location:  [place] [facility]   Surgeon:  Frederik Pear, MD      DISCUSSION: 78 yo never smoker with h/o HTN, CAD, ischemic cardiomyopathy, pre-diabetes, CKD stage III, mitral valve disease, GERD scheduled for above surgery on 05/15/2018 with Dr. Frederik Pear.    Pt underwent cardiac cath 01/06/17 and subsequent DES.  He has completed 1 year of dual antiplatelet therapy.  He was last seen by cardiology, Dr. Rozann Lesches, on 12/21/17. He was asymptomatic at this visit, f/u echo ordered which was reviewed by Dr. Domenic Polite, stable. Per Dr. Myles Gip note, "He is stable from a cardiac perspective on medical therapy and is status post DES to the ramus intermedius in September of last year.  He should be able to complete aspirin and Plavix course through this month and then reasonably hold these medications for surgery beginning in September.  He does not require further ischemic testing at this point. RCRI risk calculator is class III at 6.6% risk of major cardiac event."  Pt can proceed with planned procedure barring acute status change.  VS: BP (!) 159/86   Pulse 77   Temp 36.9 C (Oral)   Resp 18   SpO2 99%   PROVIDERS: Rory Percy, MD is PCP  Rozann Lesches, MD is Cardiologist LABS: Labs reviewed: Acceptable for surgery. (all labs ordered are listed, but only abnormal results are displayed)  Labs Reviewed  BASIC METABOLIC PANEL - Abnormal; Notable for the following components:      Result Value   Glucose, Bld 126 (*)    BUN 26 (*)    GFR calc non Af Amer 58 (*)    All other components within normal limits  URINALYSIS, ROUTINE W REFLEX MICROSCOPIC - Abnormal; Notable for the following components:   Protein, ur 30 (*)    All other components within normal limits  SURGICAL PCR  SCREEN  APTT  CBC WITH DIFFERENTIAL/PLATELET  PROTIME-INR  TYPE AND SCREEN    IMAGES: Chest Xray 02/07/18 FINDINGS: The lungs are well-expanded. There is no focal infiltrate. There is no pleural effusion. The heart and pulmonary vascularity are normal. The mediastinum is normal in width. The trachea is midline. The bony thorax exhibits no acute abnormality.  IMPRESSION: There is no active cardiopulmonary disease.  EKG: 09/20/17 Rate 75bpm Sinus rhythm  First degree AV block Right bundle branch block Old inferolateral infarct  CV: Cardiac Cath ( Subsequent PCI, DES)  Ost LAD lesion, 100 %stenosed.  2nd RPLB lesion, 90 %stenosed.  A STENT RESOLUTE ONYX [id number] drug eluting stent was successfully placed.  Lat Ramus lesion, 99 %stenosed.  Post intervention, there is a 0% residual stenosis.  The left ventricular ejection fraction is 35-45% by visual estimate.  There is moderate left ventricular systolic dysfunction.  LV end diastolic pressure is normal.  Ost LAD to Prox LAD lesion, 100 %stenosed.    Echo 01/04/18 Study Conclusions  - Left ventricle: The cavity size was normal. Wall thickness was   increased in a pattern of mild LVH. There was focal basal   hypertrophy. Systolic function was normal. The estimated ejection   fraction was in the range of 60% to 65%. Wall motion was normal;   there were no regional wall motion abnormalities. Doppler  parameters are consistent with abnormal left ventricular   relaxation (grade 1 diastolic dysfunction). Doppler parameters   are consistent with high ventricular filling pressure. - Aortic valve: Mildly calcified annulus. Trileaflet; mildly   thickened leaflets. There was mild regurgitation. Valve area   (VTI): 3.37 cm^2. Valve area (Vmax): 2.99 cm^2. Valve area   (Vmean): 2.93 cm^2. - Mitral valve: Mildly calcified annulus. Mildly thickened leaflets   . There was moderate regurgitation. Valve area by continuity    equation (using LVOT flow): 2.49 cm^2. - Left atrium: The atrium was moderately dilated. - Technically adequate study. Past Medical History:  Diagnosis Date  . Arthritis   . CKD (chronic kidney disease), stage III ([place])    patient denies  . Coronary artery disease    a. unstable angina s/p cath 01/2017 -  specifically DES to a large branching ramus intermedius. Residual disease includes ostial occlusion of the LAD with right-to-left collaterals, also 90% posterolateral stenosis. EF 35-40% by cath but then 55-60% by echo.  . Dysrhythmia    heart rests a beat  . Essential hypertension   . GERD (gastroesophageal reflux disease)    pt. denies  . Gout   . Heart murmur    pt. denies  . History of kidney stones   . Ischemic cardiomyopathy    a. EF 35-40% by cath then 55-60% by echo shortly after in 01/2017.  . Mitral valve disease    a. 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  . Pre-diabetes    patient denies    Past Surgical History:  Procedure Laterality Date  . CARDIAC CATHETERIZATION  01/06/2017  . cataract s Left 04/2017  . CHOLECYSTECTOMY    . CORONARY STENT INTERVENTION  01/06/2017   CORONARY STENT INTERVENTION  . CORONARY STENT INTERVENTION N/A 01/06/2017   Procedure: CORONARY STENT INTERVENTION;  Surgeon: Troy Sine, MD;  Location: [place] [facility];  Service: Cardiovascular;  Laterality: N/A;  . LEFT HEART CATH AND CORONARY ANGIOGRAPHY N/A 01/06/2017   Procedure: LEFT HEART CATH AND CORONARY ANGIOGRAPHY;  Surgeon: Troy Sine, MD;  Location: [place] [facility];  Service: Cardiovascular;  Laterality: N/A;  . LITHOTRIPSY    . TONSILLECTOMY      MEDICATIONS: . acetaminophen (TYLENOL) 325 MG tablet  . amLODipine (NORVASC) 5 MG tablet  . Ascorbic Acid (VITAMIN C PO)  . aspirin EC 81 MG tablet  . atorvastatin (LIPITOR) 40 MG tablet  . b complex vitamins tablet  . Cholecalciferol (VITAMIN D-3) 1000 units CAPS  .  clopidogrel (PLAVIX) 75 MG tablet  . Coenzyme Q10 (COQ-10) 200 MG CAPS  . losartan-hydrochlorothiazide (HYZAAR) 50-12.5 MG tablet  . Lysine 1000 MG TABS  . MAGNESIUM PO  . metoprolol succinate (TOPROL-XL) 25 MG 24 hr tablet  . metoprolol succinate (TOPROL-XL) 50 MG 24 hr tablet  . ranitidine (ZANTAC) 150 MG tablet  . zinc gluconate 50 MG tablet   No current facility-administered medications for this encounter.     Maia Plan [facility] Pre-Surgical Testing  [phone] 05/10/18 3:11 PM

## 2018-05-12 ENCOUNTER — Other Ambulatory Visit: Payer: Self-pay | Admitting: Orthopedic Surgery

## 2018-05-12 NOTE — H&P (Signed)
TOTAL HIP ADMISSION H&P  Patient is admitted for right total hip arthroplasty.  Subjective:  Chief Complaint: right hip pain  HPI: Christopher Nelson, 78 y.o. male, has a history of pain and functional disability in the right hip(s) due to arthritis and patient has failed non-surgical conservative treatments for greater than 12 weeks to include NSAID's and/or analgesics, corticosteriod injections, use of assistive devices and activity modification.  Onset of symptoms was gradual starting 1 years ago with gradually worsening course since that time.The patient noted no past surgery on the right hip(s).  Patient currently rates pain in the right hip at 10 out of 10 with activity. Patient has night pain, worsening of pain with activity and weight bearing, trendelenberg gait, pain that interfers with activities of daily living and pain with passive range of motion. Patient has evidence of joint space narrowing by imaging studies. This condition presents safety issues increasing the risk of falls.  There is no current active infection.  Patient Active Problem List   Diagnosis Date Noted  . Osteoarthritis of right hip 02/08/2018  . Acute systolic heart failure ([place]) 01/07/2017  . Hyperlipidemia 01/07/2017  . Abnormal myocardial perfusion study   . Coronary artery disease involving native coronary artery of native heart with unstable angina pectoris [facility])    Past Medical History:  Diagnosis Date  . Arthritis   . CKD (chronic kidney disease), stage III ([place])    patient denies  . Coronary artery disease    a. unstable angina s/p cath 01/2017 -  specifically DES to a large branching ramus intermedius. Residual disease includes ostial occlusion of the LAD with right-to-left collaterals, also 90% posterolateral stenosis. EF 35-40% by cath but then 55-60% by echo.  . Dysrhythmia    heart rests a beat  . Essential hypertension   . GERD (gastroesophageal reflux disease)    pt. denies  . Gout   . Heart  murmur    pt. denies  . History of kidney stones   . Ischemic cardiomyopathy    a. EF 35-40% by cath then 55-60% by echo shortly after in 01/2017.  . Mitral valve disease    a. 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  . Pre-diabetes    patient denies    Past Surgical History:  Procedure Laterality Date  . CARDIAC CATHETERIZATION  01/06/2017  . cataract s Left 04/2017  . CHOLECYSTECTOMY    . CORONARY STENT INTERVENTION  01/06/2017   CORONARY STENT INTERVENTION  . CORONARY STENT INTERVENTION N/A 01/06/2017   Procedure: CORONARY STENT INTERVENTION;  Surgeon: Troy Sine, MD;  Location: [place] [facility];  Service: Cardiovascular;  Laterality: N/A;  . LEFT HEART CATH AND CORONARY ANGIOGRAPHY N/A 01/06/2017   Procedure: LEFT HEART CATH AND CORONARY ANGIOGRAPHY;  Surgeon: Troy Sine, MD;  Location: [place] [facility];  Service: Cardiovascular;  Laterality: N/A;  . LITHOTRIPSY    . TONSILLECTOMY      No current facility-administered medications for this encounter.    Current Outpatient Medications  Medication Sig Dispense Refill Last Dose  . acetaminophen (TYLENOL) 325 MG tablet Take 650 mg by mouth every 6 (six) hours as needed for moderate pain.    Taking  . amLODipine (NORVASC) 5 MG tablet Take 1 tablet (5 mg total) by mouth daily. 180 tablet 3 Taking  . Ascorbic Acid (VITAMIN C PO) Take 1,000 mg by mouth daily.    Taking  . aspirin EC  81 MG tablet Take 1 tablet (81 mg total) by mouth daily. 30 tablet 3 Taking  . atorvastatin (LIPITOR) 40 MG tablet TAKE 1 TABLET BY MOUTH ONCE DAILY (Patient taking differently: Take 40 mg by mouth daily. ) 90 tablet 3   . b complex vitamins tablet Take 1 tablet by mouth daily.   Taking  . Cholecalciferol (VITAMIN D-3) 1000 units CAPS Take 1,000 Units by mouth daily.     . clopidogrel (PLAVIX) 75 MG tablet TAKE 1 TABLET BY MOUTH ONCE DAILY (Patient taking differently: Take 75 mg by mouth daily. )  90 tablet 2 Taking  . Coenzyme Q10 (COQ-10) 200 MG CAPS Take 200 mg by mouth daily.    Taking  . losartan-hydrochlorothiazide (HYZAAR) 50-12.5 MG tablet Take 0.5 tablets by mouth daily.   Taking  . Lysine 1000 MG TABS Take 1,000 mg by mouth daily.      Marland Kitchen MAGNESIUM PO Take 250 mg by mouth daily.    Taking  . metoprolol succinate (TOPROL-XL) 50 MG 24 hr tablet Take 25 mg by mouth daily. Take with or immediately following a meal.     . ranitidine (ZANTAC) 150 MG tablet Take 150 mg by mouth daily.    Taking  . zinc gluconate 50 MG tablet Take 50 mg by mouth daily.     . metoprolol succinate (TOPROL-XL) 25 MG 24 hr tablet TAKE 1 TABLET BY MOUTH ONCE DAILY (Patient not taking: Reported on 01/31/2018) 90 tablet 0 Not Taking at Unknown time   No Known Allergies  Social History   Tobacco Use  . Smoking status: Never Smoker  . Smokeless tobacco: Never Used  Substance Use Topics  . Alcohol use: No    Family History  Problem Relation Age of Onset  . Uterine cancer Mother   . Diabetes Mellitus II Sister   . Heart attack Brother        Age 21  . Pancreatic cancer Sister      Review of Systems  Constitutional: Positive for malaise/fatigue.  HENT: Negative.   Eyes: Negative.   Respiratory: Negative.   Cardiovascular: Negative.        Htn  Gastrointestinal: Negative.   Genitourinary: Negative.   Musculoskeletal: Positive for joint pain.  Skin: Negative.   Neurological: Negative.   Endo/Heme/Allergies: Bruises/bleeds easily.  Psychiatric/Behavioral: The patient is nervous/anxious.     Objective:  Physical Exam  Constitutional: He is oriented to person, place, and time. He appears well-developed and well-nourished.  HENT:  Head: Normocephalic and atraumatic.  Eyes: Pupils are equal, round, and reactive to light.  Neck: Normal range of motion. Neck supple.  Cardiovascular: Intact distal pulses.  Respiratory: Effort normal.  Musculoskeletal:        General: Tenderness present.      Comments: Patient has a highly irritable hip to any attempts at internal rotation blocks at -5, external rotation is to 30, 10 flexion contracture.  Foot tap is negative.  Good power to testing of the abductors adductors flexors and extensors of the hip.  Toes are pink and well-perfused.  Skin is intact.  Anterior laterally.  Neurological: He is alert and oriented to person, place, and time.  Skin: Skin is warm and dry.  Psychiatric: He has a normal mood and affect. His behavior is normal. Judgment and thought content normal.    Vital signs in last 24 hours:    Labs:   Estimated body mass index is 29.79 kg/m as calculated from the following:  Height as of 02/07/18: 6\' 2"  (1.88 m).   Weight as of 02/07/18: 105.2 kg.   Imaging Review CT scan done that showed bone-on-bone arthritis with large subchondral cyst of the femoral head and less so the acetabulum.      Preoperative templating of the joint replacement has been completed, documented, and submitted to the Operating Room personnel in order to optimize intra-operative equipment management.     Assessment/Plan:  End stage arthritis, right hip(s)  The patient history, physical examination, clinical judgement of the provider and imaging studies are consistent with end stage degenerative joint disease of the right hip(s) and total hip arthroplasty is deemed medically necessary. The treatment options including medical management, injection therapy, arthroscopy and arthroplasty were discussed at length. The risks and benefits of total hip arthroplasty were presented and reviewed. The risks due to aseptic loosening, infection, stiffness, dislocation/subluxation,  thromboembolic complications and other imponderables were discussed.  The patient acknowledged the explanation, agreed to proceed with the plan and consent was signed. Patient is being admitted for inpatient treatment for surgery, pain control, PT, OT, prophylactic antibiotics, VTE  prophylaxis, progressive ambulation and ADL's and discharge planning.The patient is planning to be discharged home with home health services.

## 2018-05-12 NOTE — Care Plan (Signed)
Spoke with patient prior to surgery. Will discharge to home with family and go to [facility] in [place]. 05/18/18 @ 1115 is first appointment. Rolling walker ordered.    Ladell Heads, [place]

## 2018-05-14 MED ORDER — TRANEXAMIC ACID 1000 MG/10ML IV SOLN
2000.0000 mg | INTRAVENOUS | Status: DC
  Filled 2018-05-14: qty 20

## 2018-05-14 MED ORDER — BUPIVACAINE LIPOSOME 1.3 % IJ SUSP
10.0000 mL | Freq: Once | INTRAMUSCULAR | Status: DC
  Filled 2018-05-14: qty 10

## 2018-05-15 ENCOUNTER — Inpatient Hospital Stay (HOSPITAL_COMMUNITY): Payer: Medicare Other | Admitting: Physician Assistant

## 2018-05-15 ENCOUNTER — Inpatient Hospital Stay (HOSPITAL_COMMUNITY): Payer: Medicare Other | Admitting: Anesthesiology

## 2018-05-15 ENCOUNTER — Encounter (HOSPITAL_COMMUNITY)
Admission: RE | Disposition: A | Discharge status: Home / Self Care | Payer: Self-pay | Source: Home / Self Care | Attending: Orthopedic Surgery

## 2018-05-15 ENCOUNTER — Observation Stay (HOSPITAL_COMMUNITY)
Admission: RE | Admit: 2018-05-15 | Discharge: 2018-05-16 | Disposition: A | Discharge status: Home / Self Care | Payer: Medicare Other | Attending: Orthopedic Surgery | Admitting: Orthopedic Surgery

## 2018-05-15 ENCOUNTER — Inpatient Hospital Stay (HOSPITAL_COMMUNITY): Payer: Medicare Other

## 2018-05-15 ENCOUNTER — Encounter (HOSPITAL_COMMUNITY): Payer: Self-pay | Admitting: Certified Registered"

## 2018-05-15 ENCOUNTER — Other Ambulatory Visit: Payer: Self-pay

## 2018-05-15 DIAGNOSIS — I5021 Acute systolic (congestive) heart failure: Secondary | ICD-10-CM | POA: Diagnosis not present

## 2018-05-15 DIAGNOSIS — I2510 Atherosclerotic heart disease of native coronary artery without angina pectoris: Secondary | ICD-10-CM | POA: Diagnosis not present

## 2018-05-15 DIAGNOSIS — Z955 Presence of coronary angioplasty implant and graft: Secondary | ICD-10-CM | POA: Diagnosis not present

## 2018-05-15 DIAGNOSIS — Z7982 Long term (current) use of aspirin: Secondary | ICD-10-CM | POA: Diagnosis not present

## 2018-05-15 DIAGNOSIS — Z96641 Presence of right artificial hip joint: Secondary | ICD-10-CM

## 2018-05-15 DIAGNOSIS — M1611 Unilateral primary osteoarthritis, right hip: Secondary | ICD-10-CM | POA: Diagnosis not present

## 2018-05-15 DIAGNOSIS — K219 Gastro-esophageal reflux disease without esophagitis: Secondary | ICD-10-CM | POA: Insufficient documentation

## 2018-05-15 DIAGNOSIS — N183 Chronic kidney disease, stage 3 (moderate): Secondary | ICD-10-CM | POA: Insufficient documentation

## 2018-05-15 DIAGNOSIS — E785 Hyperlipidemia, unspecified: Secondary | ICD-10-CM | POA: Insufficient documentation

## 2018-05-15 DIAGNOSIS — Z419 Encounter for procedure for purposes other than remedying health state, unspecified: Secondary | ICD-10-CM

## 2018-05-15 DIAGNOSIS — Z01818 Encounter for other preprocedural examination: Secondary | ICD-10-CM

## 2018-05-15 DIAGNOSIS — Z79899 Other long term (current) drug therapy: Secondary | ICD-10-CM | POA: Diagnosis not present

## 2018-05-15 DIAGNOSIS — Z471 Aftercare following joint replacement surgery: Secondary | ICD-10-CM | POA: Diagnosis not present

## 2018-05-15 DIAGNOSIS — M25551 Pain in right hip: Secondary | ICD-10-CM | POA: Diagnosis present

## 2018-05-15 DIAGNOSIS — I129 Hypertensive chronic kidney disease with stage 1 through stage 4 chronic kidney disease, or unspecified chronic kidney disease: Secondary | ICD-10-CM | POA: Diagnosis not present

## 2018-05-15 DIAGNOSIS — D62 Acute posthemorrhagic anemia: Secondary | ICD-10-CM | POA: Insufficient documentation

## 2018-05-15 DIAGNOSIS — Z7902 Long term (current) use of antithrombotics/antiplatelets: Secondary | ICD-10-CM | POA: Diagnosis not present

## 2018-05-15 DIAGNOSIS — I110 Hypertensive heart disease with heart failure: Secondary | ICD-10-CM | POA: Diagnosis not present

## 2018-05-15 HISTORY — PX: TOTAL HIP ARTHROPLASTY: SHX124

## 2018-05-15 LAB — TYPE AND SCREEN
ABO/RH(D): A POS
Antibody Screen: NEGATIVE

## 2018-05-15 SURGERY — ARTHROPLASTY, HIP, TOTAL, ANTERIOR APPROACH
Anesthesia: Spinal | Site: Hip | Laterality: Right

## 2018-05-15 MED ORDER — ACETAMINOPHEN 10 MG/ML IV SOLN: 1000.0000 mg | Freq: Once | INTRAVENOUS | Status: DC | PRN

## 2018-05-15 MED ORDER — LACTATED RINGERS IV SOLN: INTRAVENOUS | Status: DC

## 2018-05-15 MED ORDER — MENTHOL 3 MG MT LOZG: 1.0000 | LOZENGE | OROMUCOSAL | Status: DC | PRN

## 2018-05-15 MED ORDER — VITAMIN D 25 MCG (1000 UNIT) PO TABS
1000.0000 [IU] | ORAL_TABLET | Freq: Every day | ORAL | Status: DC
  Administered 2018-05-16: 1000 [IU] via ORAL
  Filled 2018-05-15: qty 1

## 2018-05-15 MED ORDER — MIDAZOLAM HCL 2 MG/2ML IJ SOLN
INTRAMUSCULAR | Status: AC
  Filled 2018-05-15: qty 2

## 2018-05-15 MED ORDER — B COMPLEX-C PO TABS
1.0000 | ORAL_TABLET | Freq: Every day | ORAL | Status: DC
  Administered 2018-05-16: 1 via ORAL
  Filled 2018-05-15 (×2): qty 1

## 2018-05-15 MED ORDER — CHLORHEXIDINE GLUCONATE 4 % EX LIQD: 60.0000 mL | Freq: Once | CUTANEOUS | Status: DC

## 2018-05-15 MED ORDER — AMLODIPINE BESYLATE 5 MG PO TABS
5.0000 mg | ORAL_TABLET | Freq: Every day | ORAL | Status: DC
  Administered 2018-05-16: 5 mg via ORAL
  Filled 2018-05-15: qty 1

## 2018-05-15 MED ORDER — DEXAMETHASONE SODIUM PHOSPHATE 10 MG/ML IJ SOLN
10.0000 mg | Freq: Once | INTRAMUSCULAR | Status: AC
  Administered 2018-05-16: 10 mg via INTRAVENOUS
  Filled 2018-05-15: qty 1

## 2018-05-15 MED ORDER — METOCLOPRAMIDE HCL 5 MG/ML IJ SOLN: 5.0000 mg | Freq: Three times a day (TID) | INTRAMUSCULAR | Status: DC | PRN

## 2018-05-15 MED ORDER — ALUM & MAG HYDROXIDE-SIMETH 200-200-20 MG/5ML PO SUSP: 30.0000 mL | ORAL | Status: DC | PRN

## 2018-05-15 MED ORDER — HYDROCHLOROTHIAZIDE 10 MG/ML ORAL SUSPENSION
6.2500 mg | Freq: Every day | ORAL | Status: DC
  Administered 2018-05-16: 6.25 mg via ORAL
  Filled 2018-05-15 (×2): qty 1.25

## 2018-05-15 MED ORDER — ONDANSETRON HCL 4 MG/2ML IJ SOLN
INTRAMUSCULAR | Status: DC | PRN
  Administered 2018-05-15: 4 mg via INTRAVENOUS

## 2018-05-15 MED ORDER — B COMPLEX-C PO TABS
1.0000 | ORAL_TABLET | Freq: Every day | ORAL | Status: DC
  Filled 2018-05-15: qty 1

## 2018-05-15 MED ORDER — PROPOFOL 10 MG/ML IV BOLUS
INTRAVENOUS | Status: DC | PRN
  Administered 2018-05-15: 20 mg via INTRAVENOUS

## 2018-05-15 MED ORDER — CLOPIDOGREL BISULFATE 75 MG PO TABS
75.0000 mg | ORAL_TABLET | Freq: Every day | ORAL | Status: DC
  Administered 2018-05-16: 75 mg via ORAL
  Filled 2018-05-15: qty 1

## 2018-05-15 MED ORDER — ATORVASTATIN CALCIUM 40 MG PO TABS
40.0000 mg | ORAL_TABLET | Freq: Every day | ORAL | Status: DC
  Administered 2018-05-16: 40 mg via ORAL
  Filled 2018-05-15: qty 1

## 2018-05-15 MED ORDER — BUPIVACAINE IN DEXTROSE 0.75-8.25 % IT SOLN
INTRATHECAL | Status: DC | PRN
  Administered 2018-05-15: 2 mL via INTRATHECAL

## 2018-05-15 MED ORDER — PROPOFOL 10 MG/ML IV BOLUS
INTRAVENOUS | Status: AC
  Filled 2018-05-15: qty 20

## 2018-05-15 MED ORDER — ASPIRIN 81 MG PO CHEW
81.0000 mg | CHEWABLE_TABLET | Freq: Two times a day (BID) | ORAL | Status: DC
  Administered 2018-05-15 – 2018-05-16 (×2): 81 mg via ORAL
  Filled 2018-05-15 (×2): qty 1

## 2018-05-15 MED ORDER — LACTATED RINGERS IV SOLN
INTRAVENOUS | Status: DC
  Administered 2018-05-15: 1000 mL via INTRAVENOUS
  Administered 2018-05-15: 10:00:00 via INTRAVENOUS

## 2018-05-15 MED ORDER — 0.9 % SODIUM CHLORIDE (POUR BTL) OPTIME
TOPICAL | Status: DC | PRN
  Administered 2018-05-15: 1000 mL

## 2018-05-15 MED ORDER — LIDOCAINE 2% (20 MG/ML) 5 ML SYRINGE
INTRAMUSCULAR | Status: DC | PRN
  Administered 2018-05-15: 20 mg via INTRAVENOUS

## 2018-05-15 MED ORDER — ONDANSETRON HCL 4 MG/2ML IJ SOLN: 4.0000 mg | Freq: Once | INTRAMUSCULAR | Status: DC | PRN

## 2018-05-15 MED ORDER — BUPIVACAINE-EPINEPHRINE (PF) 0.25% -1:200000 IJ SOLN
INTRAMUSCULAR | Status: AC
  Filled 2018-05-15: qty 30

## 2018-05-15 MED ORDER — DEXAMETHASONE SODIUM PHOSPHATE 10 MG/ML IJ SOLN
INTRAMUSCULAR | Status: DC | PRN
  Administered 2018-05-15: 10 mg via INTRAVENOUS

## 2018-05-15 MED ORDER — ACETAMINOPHEN 325 MG PO TABS: 325.0000 mg | ORAL_TABLET | Freq: Four times a day (QID) | ORAL | Status: DC | PRN

## 2018-05-15 MED ORDER — SODIUM CHLORIDE (PF) 0.9 % IJ SOLN
INTRAMUSCULAR | Status: AC
  Filled 2018-05-15: qty 50

## 2018-05-15 MED ORDER — CEFAZOLIN SODIUM-DEXTROSE 2-4 GM/100ML-% IV SOLN
2.0000 g | INTRAVENOUS | Status: AC
  Administered 2018-05-15: 2 g via INTRAVENOUS

## 2018-05-15 MED ORDER — ONDANSETRON HCL 4 MG PO TABS: 4.0000 mg | ORAL_TABLET | Freq: Four times a day (QID) | ORAL | Status: DC | PRN

## 2018-05-15 MED ORDER — PHENOL 1.4 % MT LIQD
1.0000 | OROMUCOSAL | Status: DC | PRN
  Filled 2018-05-15: qty 177

## 2018-05-15 MED ORDER — COQ-10 200 MG PO CAPS: 200.0000 mg | ORAL_CAPSULE | Freq: Every day | ORAL | Status: DC

## 2018-05-15 MED ORDER — TRANEXAMIC ACID 1000 MG/10ML IV SOLN
INTRAVENOUS | Status: DC | PRN
  Administered 2018-05-15: 2000 mg via TOPICAL

## 2018-05-15 MED ORDER — CELECOXIB 200 MG PO CAPS
200.0000 mg | ORAL_CAPSULE | Freq: Two times a day (BID) | ORAL | Status: DC
  Administered 2018-05-15 – 2018-05-16 (×2): 200 mg via ORAL
  Filled 2018-05-15 (×2): qty 1

## 2018-05-15 MED ORDER — MAGNESIUM OXIDE 400 (241.3 MG) MG PO TABS
400.0000 mg | ORAL_TABLET | Freq: Every day | ORAL | Status: DC
  Administered 2018-05-16: 400 mg via ORAL
  Filled 2018-05-15: qty 1

## 2018-05-15 MED ORDER — BUPIVACAINE LIPOSOME 1.3 % IJ SUSP
INTRAMUSCULAR | Status: DC | PRN
  Administered 2018-05-15: 10 mL

## 2018-05-15 MED ORDER — KCL IN DEXTROSE-NACL 20-5-0.45 MEQ/L-%-% IV SOLN
INTRAVENOUS | Status: DC
  Administered 2018-05-15 (×2): via INTRAVENOUS
  Filled 2018-05-15 (×3): qty 1000

## 2018-05-15 MED ORDER — FAMOTIDINE 20 MG PO TABS
20.0000 mg | ORAL_TABLET | Freq: Every day | ORAL | Status: DC
  Administered 2018-05-16: 20 mg via ORAL
  Filled 2018-05-15: qty 1

## 2018-05-15 MED ORDER — FENTANYL CITRATE (PF) 100 MCG/2ML IJ SOLN: 25.0000 ug | INTRAMUSCULAR | Status: DC | PRN

## 2018-05-15 MED ORDER — FLEET ENEMA 7-19 GM/118ML RE ENEM: 1.0000 | ENEMA | Freq: Once | RECTAL | Status: DC | PRN

## 2018-05-15 MED ORDER — SODIUM CHLORIDE 0.9 % IV SOLN
INTRAVENOUS | Status: DC | PRN
  Administered 2018-05-15: 5 ug/min via INTRAVENOUS

## 2018-05-15 MED ORDER — SODIUM CHLORIDE 0.9% FLUSH
INTRAVENOUS | Status: DC | PRN
  Administered 2018-05-15: 50 mL

## 2018-05-15 MED ORDER — POLYETHYLENE GLYCOL 3350 17 G PO PACK: 17.0000 g | PACK | Freq: Every day | ORAL | Status: DC | PRN

## 2018-05-15 MED ORDER — METOPROLOL SUCCINATE ER 25 MG PO TB24
25.0000 mg | ORAL_TABLET | Freq: Every day | ORAL | Status: DC
  Administered 2018-05-16: 25 mg via ORAL
  Filled 2018-05-15: qty 1

## 2018-05-15 MED ORDER — STERILE WATER FOR IRRIGATION IR SOLN
Status: DC | PRN
  Administered 2018-05-15: 2000 mL

## 2018-05-15 MED ORDER — BUPIVACAINE-EPINEPHRINE 0.25% -1:200000 IJ SOLN
INTRAMUSCULAR | Status: DC | PRN
  Administered 2018-05-15: 30 mL

## 2018-05-15 MED ORDER — METHOCARBAMOL 1000 MG/10ML IJ SOLN
500.0000 mg | Freq: Four times a day (QID) | INTRAVENOUS | Status: DC | PRN
  Filled 2018-05-15: qty 5

## 2018-05-15 MED ORDER — METHOCARBAMOL 500 MG PO TABS
500.0000 mg | ORAL_TABLET | Freq: Four times a day (QID) | ORAL | Status: DC | PRN
  Administered 2018-05-15 – 2018-05-16 (×2): 500 mg via ORAL
  Filled 2018-05-15 (×2): qty 1

## 2018-05-15 MED ORDER — CEFAZOLIN SODIUM-DEXTROSE 2-4 GM/100ML-% IV SOLN
INTRAVENOUS | Status: AC
  Filled 2018-05-15: qty 100

## 2018-05-15 MED ORDER — METOCLOPRAMIDE HCL 5 MG PO TABS: 5.0000 mg | ORAL_TABLET | Freq: Three times a day (TID) | ORAL | Status: DC | PRN

## 2018-05-15 MED ORDER — DOCUSATE SODIUM 100 MG PO CAPS
100.0000 mg | ORAL_CAPSULE | Freq: Two times a day (BID) | ORAL | Status: DC
  Administered 2018-05-15 – 2018-05-16 (×2): 100 mg via ORAL
  Filled 2018-05-15 (×2): qty 1

## 2018-05-15 MED ORDER — OXYCODONE HCL 5 MG PO TABS
5.0000 mg | ORAL_TABLET | ORAL | Status: DC | PRN
  Administered 2018-05-15 – 2018-05-16 (×5): 5 mg via ORAL
  Filled 2018-05-15 (×5): qty 1

## 2018-05-15 MED ORDER — ONDANSETRON HCL 4 MG/2ML IJ SOLN: 4.0000 mg | Freq: Four times a day (QID) | INTRAMUSCULAR | Status: DC | PRN

## 2018-05-15 MED ORDER — TRANEXAMIC ACID-NACL 1000-0.7 MG/100ML-% IV SOLN
1000.0000 mg | INTRAVENOUS | Status: AC
  Administered 2018-05-15: 1000 mg via INTRAVENOUS

## 2018-05-15 MED ORDER — GABAPENTIN 300 MG PO CAPS
300.0000 mg | ORAL_CAPSULE | Freq: Three times a day (TID) | ORAL | Status: DC
  Administered 2018-05-15 – 2018-05-16 (×3): 300 mg via ORAL
  Filled 2018-05-15 (×3): qty 1

## 2018-05-15 MED ORDER — LOSARTAN POTASSIUM 25 MG PO TABS
25.0000 mg | ORAL_TABLET | Freq: Every day | ORAL | Status: DC
  Administered 2018-05-16: 25 mg via ORAL
  Filled 2018-05-15: qty 1

## 2018-05-15 MED ORDER — LYSINE 1000 MG PO TABS: 1000.0000 mg | ORAL_TABLET | Freq: Every day | ORAL | Status: DC

## 2018-05-15 MED ORDER — TRANEXAMIC ACID-NACL 1000-0.7 MG/100ML-% IV SOLN
INTRAVENOUS | Status: AC
  Filled 2018-05-15: qty 100

## 2018-05-15 MED ORDER — ZINC SULFATE 220 (50 ZN) MG PO CAPS
220.0000 mg | ORAL_CAPSULE | Freq: Every day | ORAL | Status: DC
  Administered 2018-05-16: 220 mg via ORAL
  Filled 2018-05-15 (×2): qty 1

## 2018-05-15 MED ORDER — ACETAMINOPHEN 500 MG PO TABS
1000.0000 mg | ORAL_TABLET | Freq: Four times a day (QID) | ORAL | Status: AC
  Administered 2018-05-15 – 2018-05-16 (×4): 1000 mg via ORAL
  Filled 2018-05-15 (×4): qty 2

## 2018-05-15 MED ORDER — FENTANYL CITRATE (PF) 250 MCG/5ML IJ SOLN
INTRAMUSCULAR | Status: AC
  Filled 2018-05-15: qty 5

## 2018-05-15 MED ORDER — HYDROMORPHONE HCL 1 MG/ML IJ SOLN: 0.5000 mg | INTRAMUSCULAR | Status: DC | PRN

## 2018-05-15 MED ORDER — DIPHENHYDRAMINE HCL 12.5 MG/5ML PO ELIX: 12.5000 mg | ORAL_SOLUTION | ORAL | Status: DC | PRN

## 2018-05-15 MED ORDER — LOSARTAN POTASSIUM-HCTZ 50-12.5 MG PO TABS: 0.5000 | ORAL_TABLET | Freq: Every day | ORAL | Status: DC

## 2018-05-15 MED ORDER — TRANEXAMIC ACID-NACL 1000-0.7 MG/100ML-% IV SOLN
1000.0000 mg | Freq: Once | INTRAVENOUS | Status: AC
  Administered 2018-05-15: 1000 mg via INTRAVENOUS
  Filled 2018-05-15: qty 100

## 2018-05-15 MED ORDER — PANTOPRAZOLE SODIUM 40 MG PO TBEC
40.0000 mg | DELAYED_RELEASE_TABLET | Freq: Every day | ORAL | Status: DC
  Administered 2018-05-16: 40 mg via ORAL
  Filled 2018-05-15: qty 1

## 2018-05-15 MED ORDER — FENTANYL CITRATE (PF) 250 MCG/5ML IJ SOLN
INTRAMUSCULAR | Status: DC | PRN
  Administered 2018-05-15: 50 ug via INTRAVENOUS
  Administered 2018-05-15 (×2): 25 ug via INTRAVENOUS

## 2018-05-15 MED ORDER — PROPOFOL 500 MG/50ML IV EMUL
INTRAVENOUS | Status: DC | PRN
  Administered 2018-05-15: 25 ug/kg/min via INTRAVENOUS

## 2018-05-15 MED ORDER — BISACODYL 5 MG PO TBEC: 5.0000 mg | DELAYED_RELEASE_TABLET | Freq: Every day | ORAL | Status: DC | PRN

## 2018-05-15 SURGICAL SUPPLY — 51 items
ARTICULEZE HEAD (Hips) ×3 IMPLANT
BAG DECANTER FOR FLEXI CONT (MISCELLANEOUS) ×3 IMPLANT
BLADE SAW SGTL 18X1.27X75 (BLADE) ×2 IMPLANT
BLADE SAW SGTL 18X1.27X75MM (BLADE) ×1
BLADE SURG SZ10 CARB STEEL (BLADE) ×6 IMPLANT
CONT SPEC 4OZ CLIKSEAL STRL BL (MISCELLANEOUS) ×3 IMPLANT
COVER PERINEAL POST (MISCELLANEOUS) ×3 IMPLANT
COVER SURGICAL LIGHT HANDLE (MISCELLANEOUS) ×3 IMPLANT
COVER WAND RF STERILE (DRAPES) ×3 IMPLANT
DECANTER SPIKE VIAL GLASS SM (MISCELLANEOUS) ×3 IMPLANT
DRAPE SHEET LG 3/4 BI-LAMINATE (DRAPES) ×3 IMPLANT
DRAPE STERI IOBAN 125X83 (DRAPES) ×3 IMPLANT
DRAPE U-SHAPE 47X51 STRL (DRAPES) ×6 IMPLANT
DRSG AQUACEL AG ADV 3.5X10 (GAUZE/BANDAGES/DRESSINGS) ×3 IMPLANT
DURAPREP 26ML APPLICATOR (WOUND CARE) ×3 IMPLANT
ELECT REM PT RETURN 15FT ADLT (MISCELLANEOUS) ×3 IMPLANT
ELIMINATOR HOLE APEX DEPUY (Hips) ×3 IMPLANT
GLOVE BIO SURGEON STRL SZ7.5 (GLOVE) ×3 IMPLANT
GLOVE BIO SURGEON STRL SZ8.5 (GLOVE) ×3 IMPLANT
GLOVE BIOGEL PI IND STRL 7.0 (GLOVE) ×1 IMPLANT
GLOVE BIOGEL PI IND STRL 7.5 (GLOVE) ×1 IMPLANT
GLOVE BIOGEL PI IND STRL 8 (GLOVE) ×1 IMPLANT
GLOVE BIOGEL PI IND STRL 9 (GLOVE) ×1 IMPLANT
GLOVE BIOGEL PI INDICATOR 7.0 (GLOVE) ×2
GLOVE BIOGEL PI INDICATOR 7.5 (GLOVE) ×2
GLOVE BIOGEL PI INDICATOR 8 (GLOVE) ×2
GLOVE BIOGEL PI INDICATOR 9 (GLOVE) ×2
GLOVE SURG SS PI 7.5 STRL IVOR (GLOVE) ×3 IMPLANT
GOWN STRL REIN 2XL XLG LVL4 (GOWN DISPOSABLE) ×3 IMPLANT
GOWN STRL REUS W/TWL XL LVL3 (GOWN DISPOSABLE) ×6 IMPLANT
HEAD ARTICULEZE (Hips) ×1 IMPLANT
HOLDER FOLEY CATH W/STRAP (MISCELLANEOUS) ×3 IMPLANT
MANIFOLD NEPTUNE II (INSTRUMENTS) ×3 IMPLANT
NEEDLE HYPO 21X1.5 SAFETY (NEEDLE) ×6 IMPLANT
NS IRRIG 1000ML POUR BTL (IV SOLUTION) ×3 IMPLANT
PACK ANTERIOR HIP CUSTOM (KITS) ×3 IMPLANT
PIN SECT CUP 56MM (Hips) ×3 IMPLANT
PINNACLE ALTRX PLUS 4 N 36X56 (Hips) ×3 IMPLANT
STEM FEM ACTIS HIGH SZ7 (Stem) ×3 IMPLANT
SUT ETHIBOND NAB CT1 #1 30IN (SUTURE) ×3 IMPLANT
SUT VIC AB 0 CT1 27 (SUTURE) ×3
SUT VIC AB 0 CT1 27XBRD ANBCTR (SUTURE) ×1 IMPLANT
SUT VIC AB 1 CTX 36 (SUTURE) ×3
SUT VIC AB 1 CTX36XBRD ANBCTR (SUTURE) ×1 IMPLANT
SUT VIC AB 2-0 CT1 27 (SUTURE) ×3
SUT VIC AB 2-0 CT1 TAPERPNT 27 (SUTURE) ×1 IMPLANT
SUT VIC AB 3-0 CT1 27 (SUTURE) ×3
SUT VIC AB 3-0 CT1 TAPERPNT 27 (SUTURE) ×1 IMPLANT
SYR CONTROL 10ML LL (SYRINGE) ×9 IMPLANT
TRAY FOLEY MTR SLVR 16FR STAT (SET/KITS/TRAYS/PACK) ×3 IMPLANT
YANKAUER SUCT BULB TIP 10FT TU (MISCELLANEOUS) ×3 IMPLANT

## 2018-05-15 NOTE — Transfer of Care (Signed)
Immediate Anesthesia Transfer of Care Note  Patient: Christopher Nelson  Procedure(s) Performed: TOTAL HIP ARTHROPLASTY ANTERIOR APPROACH (Right Hip)  Patient Location: PACU  Anesthesia Type:Spinal  Level of Consciousness: sedated  Airway & Oxygen Therapy: Patient Spontanous Breathing and Patient connected to face mask oxygen  Post-op Assessment: Report given to RN and Post -op Vital signs reviewed and unstable, Anesthesiologist notified  Post vital signs: Reviewed and stable  Last Vitals:  Vitals Value Taken Time  BP    Temp    Pulse 60 05/15/2018 11:47 AM  Resp 0 05/15/2018 11:47 AM  SpO2 100 % 05/15/2018 11:47 AM  Vitals shown include unvalidated device data.  Last Pain:  Vitals:   05/15/18 0847  TempSrc:   PainSc: 0-No pain      Patients Stated Pain Goal: 4 (08/04/57 1368)  Complications: No apparent anesthesia complications

## 2018-05-15 NOTE — Evaluation (Signed)
Physical Therapy Evaluation Patient Details Name: Christopher Nelson MRN: [id number] DOB: [birth year] Today's Date: 05/15/2018   History of Present Illness  78 yo male s/p R DA-THA on 05/15/18. PMH includes OA, heart failure, HLD, CAD, CKD, unstable angina s/p heart cath 2018, HTN, GERD, mitral valve disease, coronary stenting.   Clinical Impression   Pt presents with R hip pain, post-surgical R hip weakness, increased time and effort to perform mobility tasks. Pt to benefit from acute PT to address deficits. Pt ambulated 55 ft with RW with min guard assist, verbal cuing provided throughout. Pt educated on ankle pumps (20/hour) to perform this afternoon/evening to increase circulation, to pt's tolerance and limited by pain. PT to progress mobility as tolerated, and will continue to follow acutely.        Follow Up Recommendations Follow surgeon's recommendation for DC plan and follow-up therapies;Supervision for mobility/OOB(HHPT )    Equipment Recommendations  Rolling walker with 5" wheels    Recommendations for Other Services       Precautions / Restrictions Precautions Precautions: Fall Restrictions Weight Bearing Restrictions: No Other Position/Activity Restrictions: WBAT       Mobility  Bed Mobility Overal bed mobility: Needs Assistance Bed Mobility: Supine to Sit     Supine to sit: HOB elevated;Min guard     General bed mobility comments: Min guard for safety. Increased time and effort, pt stated he did not want physical assist when attempting to help.   Transfers Overall transfer level: Needs assistance Equipment used: Rolling walker (2 wheeled) Transfers: Sit to/from Stand Sit to Stand: Min guard;From elevated surface         General transfer comment: Min guard for safety. Verbal cuing for hand placement.   Ambulation/Gait Ambulation/Gait assistance: Min guard;+2 safety/equipment(chair follow ) Gait Distance (Feet): 80 Feet Assistive device: Rolling walker  (2 wheeled) Gait Pattern/deviations: Step-to pattern;Step-through pattern;Decreased stride length Gait velocity: decr    General Gait Details: Min guard for safety. Verbal cuing for sequencing and step-to gait although pt quickly progressed to step-through gait, placement in RW.   Stairs            Wheelchair Mobility    Modified Rankin (Stroke Patients Only)       Balance Overall balance assessment: Mild deficits observed, not formally tested                                           Pertinent Vitals/Pain Pain Assessment: 0-10 Pain Score: 1  Pain Location: R hip  Pain Descriptors / Indicators: Aching;Burning Pain Intervention(s): Repositioned;Limited activity within patient's tolerance;Ice applied;Monitored during session    [place] expects to be discharged to:: Private residence Living Arrangements: Spouse/significant other Available Help at Discharge: Family;Available PRN/intermittently Type of Home: House Home Access: Level entry     Home Layout: Two level Home Equipment: Shower seat - built in;Cane - single point      Prior Function Level of Independence: Independent               Hand Dominance   Dominant Hand: Right    Extremity/Trunk Assessment   Upper Extremity Assessment Upper Extremity Assessment: Overall WFL for tasks assessed    Lower Extremity Assessment Lower Extremity Assessment: RLE deficits/detail;Overall WFL for tasks assessed RLE Deficits / Details: suspected post-surgical hip weakness; able to perform ankle pumps, quad set, assisted heel slide  RLE Sensation:  WNL    Cervical / Trunk Assessment Cervical / Trunk Assessment: Normal  Communication   Communication: No difficulties  Cognition Arousal/Alertness: Awake/alert Behavior During Therapy: WFL for tasks assessed/performed Overall Cognitive Status: Within Functional Limits for tasks assessed                                         General Comments      Exercises     Assessment/Plan    PT Assessment Patient needs continued PT services  PT Problem List Decreased strength;Pain;Decreased activity tolerance;Decreased knowledge of use of DME;Decreased balance;Decreased mobility       PT Treatment Interventions DME instruction;Therapeutic activities;Gait training;Therapeutic exercise;Patient/family education;Stair training;Balance training;Functional mobility training    PT Goals (Current goals can be found in the Care Plan section)  Acute Rehab PT Goals PT Goal Formulation: With patient Time For Goal Achievement: 05/22/18 Potential to Achieve Goals: Good    Frequency 7X/week   Barriers to discharge        Co-evaluation               AM-PAC PT "6 Clicks" Mobility  Outcome Measure Help needed turning from your back to your side while in a flat bed without using bedrails?: A Little Help needed moving from lying on your back to sitting on the side of a flat bed without using bedrails?: A Little Help needed moving to and from a bed to a chair (including a wheelchair)?: A Little Help needed standing up from a chair using your arms (e.g., wheelchair or bedside chair)?: A Little Help needed to walk in hospital room?: A Little Help needed climbing 3-5 steps with a railing? : A Little 6 Click Score: 18    End of Session Equipment Utilized During Treatment: Gait belt Activity Tolerance: Patient tolerated treatment well Patient left: in chair;with chair alarm set;with call bell/phone within reach;with family/visitor present;with SCD's reapplied Nurse Communication: Mobility status PT Visit Diagnosis: Other abnormalities of gait and mobility (R26.89);Difficulty in walking, not elsewhere classified (R26.2)    Time: 1093-2355 PT Time Calculation (min) (ACUTE ONLY): 19 min   Charges:   PT Evaluation $PT Eval Low Complexity: 1 Low         Julien Girt, PT Acute Rehabilitation  Services Pager [phone]  Office [phone]   Roxine Caddy D Elonda Husky 05/15/2018, 6:13 PM

## 2018-05-15 NOTE — Op Note (Signed)
OPERATIVE REPORT    DATE OF PROCEDURE:  05/15/2018       PREOPERATIVE DIAGNOSIS:  RIGHT HIP OSTEOARTHRITIS                                                          POSTOPERATIVE DIAGNOSIS:  RIGHT HIP OSTEOARTHRITIS                                                           PROCEDURE: Anterior R total hip arthroplasty using a 56 mm DePuy Pinnacle  Cup, [employer], 0-degree polyethylene liner, a +5 mm x 72mm metal head, a 7 hi Depuy Actis stem   SURGEON: Kerin Salen    ASSISTANT:   Kerry Hough. [employer]  (present throughout entire procedure and necessary for timely completion of the procedure)   ANESTHESIA: Spinal BLOOD LOSS: 300 cc FLUID REPLACEMENT: 1600 cc crystalloid Antibiotic: 2gm ancef Tranexamic Acid: 1gm IV, 2gm Topical Exparel: 266mg  COMPLICATIONS: none    INDICATIONS FOR PROCEDURE: A 78 y.o. year-old With  RIGHT HIP OSTEOARTHRITIS   for 4 years, x-rays show bone-on-bone arthritic changes, and osteophytes. Despite conservative measures with observation, anti-inflammatory medicine, narcotics, use of a cane, has severe unremitting pain and can ambulate only a few blocks before resting. Patient desires elective R total hip arthroplasty to decrease pain and increase function. The risks, benefits, and alternatives were discussed at length including but not limited to the risks of infection, bleeding, nerve injury, stiffness, blood clots, the need for revision surgery, cardiopulmonary complications, among others, and they were willing to proceed. Questions answered      PROCEDURE IN DETAIL: The patient was identified by armband,   received preoperative IV antibiotics in the holding area at [facility], taken to the operating room , appropriate anesthetic monitors   were attached and  anesthesia was induced with the patient on the gurney. The HANA boots were applied to the feet and the patient  was transferred to the HANA table with a peroneal post and support  underneath the non-operative leg, which was locked in 2 lb traction. Theoperative lower extremity was then prepped and draped in the usual sterile fashion from just above the iliac crest to the knee. And a timeout procedure was performed. We then made a 14 cm incision along the interval at the leading edge of the tensor fascia lata of starting at 2 cm lateral to the ASIS. Small bleeders in the skin and subcutaneous tissue identified and cauterized we dissected down to the fascia and made an incision in the fascia allowing [place] to elevate the fascia of the tensor muscle and exploited the interval between the rectus and the tensor fascia lata. A Cobra retractor was then placed along the superior neck of the femur. A cerebellar retractor was used to expose the interval between the tensor fascia lata and the rectus femoris. .  We identified and cauterized the ascending branch of the anterior circumflex artery. A second Cobra retractor along the inferior neck of the femur. A small Hohmann retractor was placed underneath the origin of the rectus femoris, giving [place]  good medial exposure. Using Ronguers fatty tissue was removed from in front of the anterior capsule. The capsule was then incised, starting out at the superior anterior aspect of the acetabulum going laterally along the anterior neck. The capsule was then teed along the neck superiorly and inferiorly. Electrocautery was used to release capsule from the anterior and medial neck of the femur to allow external rotation. Cobra retractors were then placed along the inferior and superior neck allowing [place] to perform a standard neck cut and removed the femoral head with a power corkscrew. We then placed a medium bent homan retractor in the cotyloid notch and posteriorly along the acetabular rim a narrow Cobra retractor. Exposed labral tissue was then removed with the electrocautery. We then sequentially reamed up to a 55 mm basket reamer obtaining good coverage in all  quadrants, verified by C-arm imaging. Under C-arm control we then hammered into place a 56 mm Pinnacle cup in 45 of abduction and 15 of anteversion. The cup seated nicely and required no supplemental screws. We then placed a central hole Eliminator and a 0 polyethylene liner. The foot was then externally rotated to 130-140. The limb was extended and adducted delivering the proximal femur up into the wound. A medium curved Hohmann retractor was placed over the greater trochanter and a long Homan retractor along the posterior femoral neck completing the exposure. We then performed releases superiorly and and inferiorly of the capsule going back to the pirformis fossa superiorly and to the lesser trochanter inferiorly. We then entered the proximal femur with the box cutting offset chisel followed by, a canal sounder, the chili pepper and broaching up to a 7 broach. This seated nicely and we reamed the calcar. A trial reduction was performed with a 1.5 mm 36 mm head.The limb lengths were excellent the hip was stable in 90 of external rotation. At this point the trial components removed and we hammered into place a # 7 hi  Offset Actis stem with Gryption coating. A +5 mm x 36 metal head was then hammered into place. The hip was reduced and final C-arm images obtained. The wound was thoroughly irrigated with normal saline solution. We repaired the ant capsule and the tensor fascia lot a with running 0 vicryl suture. the subcutaneous tissue was closed with 2-0 and 3-0 Vicryl suture followed by an Aquacil dressing. At this point the patient was awaken and transferred to hospital gurney without difficulty.   Kerin Salen 05/15/2018, 11:10 AM

## 2018-05-15 NOTE — Anesthesia Procedure Notes (Signed)
Procedure Name: MAC Date/Time: 05/15/2018 9:48 AM Performed by: Cynda Familia, CRNA Pre-anesthesia Checklist: Patient identified, Emergency Drugs available, Suction available, Patient being monitored and Timeout performed Patient Re-evaluated:Patient Re-evaluated prior to induction Oxygen Delivery Method: Simple face mask Placement Confirmation: positive ETCO2 and breath sounds checked- equal and bilateral Dental Injury: Teeth and Oropharynx as per pre-operative assessment  Comments: Sedation for spinal

## 2018-05-15 NOTE — Interval H&P Note (Signed)
History and Physical Interval Note:  05/15/2018 8:36 AM  Christopher Nelson  has presented today for surgery, with the diagnosis of RIGHT HIP OSTEOARTHRITIS  The various methods of treatment have been discussed with the patient and family. After consideration of risks, benefits and other options for treatment, the patient has consented to  Procedure(s): TOTAL HIP ARTHROPLASTY ANTERIOR APPROACH (Right) as a surgical intervention .  The patient's history has been reviewed, patient examined, no change in status, stable for surgery.  I have reviewed the patient's chart and labs.  Questions were answered to the patient's satisfaction.     Kerin Salen

## 2018-05-15 NOTE — Progress Notes (Signed)
PHARMACIST - PHYSICIAN ORDER COMMUNICATION  CONCERNING: P&T Medication Policy on Herbal Medications  DESCRIPTION:  This patient's order for:  Co-Q10 and Lysine  has been noted.  This product(s) is classified as an "herbal" or natural product. Due to a lack of definitive safety studies or FDA approval, nonstandard manufacturing practices, plus the potential risk of unknown drug-drug interactions while on inpatient medications, the Pharmacy and Therapeutics Committee does not permit the use of "herbal" or natural products of this type within [facility].   ACTION TAKEN: The pharmacy department is unable to verify this order at this time and the order has been discontinued. Please reevaluate patient's clinical condition at discharge and address if the herbal or natural product(s) should be resumed at that time.   Royetta Asal, PharmD, BCPS Pager [phone] 05/15/2018 2:09 PM

## 2018-05-15 NOTE — Anesthesia Procedure Notes (Addendum)
Spinal  Patient location during procedure: OR Start time: 05/15/2018 9:56 AM End time: 05/15/2018 9:59 AM Staffing Anesthesiologist: Barnet Glasgow, MD Performed: anesthesiologist  Preanesthetic Checklist Completed: patient identified, surgical consent, pre-op evaluation, timeout performed, IV checked, risks and benefits discussed and monitors and equipment checked Spinal Block Patient position: sitting Prep: site prepped and draped and DuraPrep Patient monitoring: heart rate, cardiac monitor, continuous pulse ox and blood pressure Approach: midline Location: L3-4 Injection technique: single-shot Needle Needle type: Pencan  Needle gauge: 24 G Needle length: 10 cm Needle insertion depth: 6 cm Assessment Sensory level: T4 Additional Notes 1 Attempt (s). After CRNA attempted x 2.  Pt tolerated procedure well.

## 2018-05-15 NOTE — Anesthesia Postprocedure Evaluation (Signed)
Anesthesia Post Note  Patient: Christopher Nelson  Procedure(s) Performed: TOTAL HIP ARTHROPLASTY ANTERIOR APPROACH (Right Hip)     Patient location during evaluation: PACU Anesthesia Type: Spinal Level of consciousness: oriented and awake and alert Pain management: pain level controlled Vital Signs Assessment: post-procedure vital signs reviewed and stable Respiratory status: spontaneous breathing, respiratory function stable and patient connected to nasal cannula oxygen Cardiovascular status: blood pressure returned to baseline and stable Postop Assessment: no headache, no backache and no apparent nausea or vomiting Anesthetic complications: no    Last Vitals:  Vitals:   05/15/18 1245 05/15/18 1300  BP: (!) 144/72 (!) 151/77  Pulse: (!) 57 (!) 52  Resp: 12 11  Temp:    SpO2: 100% 100%    Last Pain:  Vitals:   05/15/18 1245  TempSrc:   PainSc: 0-No pain                 Barnet Glasgow

## 2018-05-15 NOTE — Discharge Instructions (Signed)

## 2018-05-15 NOTE — Care Plan (Addendum)
Ortho Bundle Case Management Note  Patient Details  Name: Christopher Nelson MRN: [id number] Date of Birth: August 04, 1940   Spoke with patient prior to surgery. Will discharge to home with family and go to [facility] in [place]. 05/18/18 @ 1115 is first appointment. Rolling walker ordered.                   DME Arranged:  Gilford Rile rolling DME Agency:  [employer]  HH Arranged:    [place] Agency:     Additional Comments: Please contact me with any questions of if this plan should need to change.  Ladell Heads,  [facility]  [phone] 05/15/2018, 8:22 AM

## 2018-05-16 DIAGNOSIS — Z955 Presence of coronary angioplasty implant and graft: Secondary | ICD-10-CM | POA: Diagnosis not present

## 2018-05-16 DIAGNOSIS — I129 Hypertensive chronic kidney disease with stage 1 through stage 4 chronic kidney disease, or unspecified chronic kidney disease: Secondary | ICD-10-CM | POA: Diagnosis not present

## 2018-05-16 DIAGNOSIS — I2510 Atherosclerotic heart disease of native coronary artery without angina pectoris: Secondary | ICD-10-CM | POA: Diagnosis not present

## 2018-05-16 DIAGNOSIS — D62 Acute posthemorrhagic anemia: Secondary | ICD-10-CM | POA: Diagnosis not present

## 2018-05-16 DIAGNOSIS — N183 Chronic kidney disease, stage 3 (moderate): Secondary | ICD-10-CM | POA: Diagnosis not present

## 2018-05-16 DIAGNOSIS — M1611 Unilateral primary osteoarthritis, right hip: Secondary | ICD-10-CM | POA: Diagnosis not present

## 2018-05-16 LAB — BASIC METABOLIC PANEL
Anion gap: 9 (ref 5–15)
BUN: 30 mg/dL — ABNORMAL HIGH (ref 8–23)
CO2: 21 mmol/L — ABNORMAL LOW (ref 22–32)
Calcium: 8.2 mg/dL — ABNORMAL LOW (ref 8.9–10.3)
Chloride: 104 mmol/L (ref 98–111)
Creatinine, Ser: 1.39 mg/dL — ABNORMAL HIGH (ref 0.61–1.24)
GFR calc Af Amer: 56 mL/min — ABNORMAL LOW (ref >60)
GFR calc non Af Amer: 49 mL/min — ABNORMAL LOW (ref >60)
Glucose, Bld: 302 mg/dL — ABNORMAL HIGH (ref 70–99)
Potassium: 4.5 mmol/L (ref 3.5–5.1)
Sodium: 134 mmol/L — ABNORMAL LOW (ref 135–145)

## 2018-05-16 LAB — CBC
HCT: 36 % — ABNORMAL LOW (ref 39.0–52.0)
Hemoglobin: 11.8 g/dL — ABNORMAL LOW (ref 13.0–17.0)
MCH: 29.3 pg (ref 26.0–34.0)
MCHC: 32.8 g/dL (ref 30.0–36.0)
MCV: 89.3 fL (ref 80.0–100.0)
NRBC: 0 % (ref 0.0–0.2)
Platelets: 172 10*3/uL (ref 150–400)
RBC: 4.03 MIL/uL — ABNORMAL LOW (ref 4.22–5.81)
RDW: 12.8 % (ref 11.5–15.5)
WBC: 15.5 10*3/uL — ABNORMAL HIGH (ref 4.0–10.5)

## 2018-05-16 MED ORDER — ASPIRIN EC 81 MG PO TBEC: 81.0000 mg | DELAYED_RELEASE_TABLET | Freq: Two times a day (BID) | ORAL | 0 refills | Status: DC

## 2018-05-16 MED ORDER — OXYCODONE-ACETAMINOPHEN 5-325 MG PO TABS: 1.0000 | ORAL_TABLET | ORAL | 0 refills | Status: DC | PRN

## 2018-05-16 MED ORDER — TIZANIDINE HCL 2 MG PO TABS: 2.0000 mg | ORAL_TABLET | Freq: Four times a day (QID) | ORAL | 0 refills | Status: DC | PRN

## 2018-05-16 NOTE — Progress Notes (Signed)
Physical Therapy Treatment Patient Details Name: Christopher Nelson MRN: [id number] DOB: 1940/12/23 Today's Date: 05/16/2018    History of Present Illness 78 yo male s/p R DA-THA on 05/15/18. PMH includes OA, heart failure, HLD, CAD, CKD, unstable angina s/p heart cath 2018, HTN, GERD, mitral valve disease, coronary stenting.     PT Comments    POD # 1 am session Assisted OOB to amb, practice stairs then TE's. General bed mobility comments: demonstarted and instructed how to use a belt to self assist General transfer comment: <25% VC's safety with turns and proper hand placement  General Gait Details: <25% VC's on safety with turns and proper walker to self distance  General stair comments: 25% VC's on proper sequencing with spouse "hands on" assist with VC's on proper tech and safety  Performed some TE's following HEP handout.  Instructed on proper tech, freq as well as use of ICE.   Addressed all mobility questions, discussed appropriate activity, educated on use of ICE.  Pt ready for D/C to home.   Follow Up Recommendations  Follow surgeon's recommendation for DC plan and follow-up therapies;Supervision for mobility/OOB(OP)     Equipment Recommendations  Rolling walker with 5" wheels    Recommendations for Other Services       Precautions / Restrictions Precautions Precautions: Fall Restrictions Weight Bearing Restrictions: No Other Position/Activity Restrictions: WBAT     Mobility  Bed Mobility Overal bed mobility: Needs Assistance Bed Mobility: Supine to Sit     Supine to sit: Supervision     General bed mobility comments: demonstarted and instructed how to use a belt to self assist  Transfers Overall transfer level: Needs assistance Equipment used: Rolling walker (2 wheeled) Transfers: Sit to/from Stand Sit to Stand: Supervision         General transfer comment: <25% VC's safety with turns and proper hand placement   Ambulation/Gait Ambulation/Gait  assistance: Supervision Gait Distance (Feet): 125 Feet Assistive device: Rolling walker (2 wheeled) Gait Pattern/deviations: Step-to pattern;Step-through pattern;Decreased stride length Gait velocity: decr    General Gait Details: <25% VC's on safety with turns and proper walker to self distance    Stairs Stairs: Yes Stairs assistance: Min guard;Supervision Stair Management: One rail Right;Step to pattern;Forwards Number of Stairs: 3 General stair comments: 25% VC's on proper sequencing with spouse "hands on" assist with VC's on proper tech and safety    Wheelchair Mobility    Modified Rankin (Stroke Patients Only)       Balance                                            Cognition Arousal/Alertness: Awake/alert Behavior During Therapy: WFL for tasks assessed/performed Overall Cognitive Status: Within Functional Limits for tasks assessed                                        Exercises      General Comments        Pertinent Vitals/Pain Pain Assessment: 0-10 Pain Score: 4  Pain Location: R hip  Pain Descriptors / Indicators: Aching;Burning;Operative site guarding Pain Intervention(s): Monitored during session;Repositioned;Premedicated before session;Ice applied    Home Living                      Prior  Function            PT Goals (current goals can now be found in the care plan section)      Frequency    7X/week      PT Plan Current plan remains appropriate    Co-evaluation              AM-PAC PT "6 Clicks" Mobility   Outcome Measure  Help needed turning from your back to your side while in a flat bed without using bedrails?: A Little Help needed moving from lying on your back to sitting on the side of a flat bed without using bedrails?: A Little Help needed moving to and from a bed to a chair (including a wheelchair)?: A Little Help needed standing up from a chair using your arms (e.g.,  wheelchair or bedside chair)?: A Little Help needed to walk in hospital room?: A Little Help needed climbing 3-5 steps with a railing? : A Little 6 Click Score: 18    End of Session Equipment Utilized During Treatment: Gait belt Activity Tolerance: Patient tolerated treatment well Patient left: in chair;with call bell/phone within reach;with family/visitor present Nurse Communication: (pt ready for D/C to home ) PT Visit Diagnosis: Other abnormalities of gait and mobility (R26.89);Difficulty in walking, not elsewhere classified (R26.2)     Time: 1027-2536 PT Time Calculation (min) (ACUTE ONLY): 43 min  Charges:  $Gait Training: 8-22 mins $Therapeutic Exercise: 8-22 mins $Self Care/Home Management: 8-22                     Rica Koyanagi  PTA Acute  Rehabilitation Services Pager      [phone] Office      [phone]

## 2018-05-16 NOTE — Care Management Note (Signed)
Case Management Note  Patient Details  Name: Christopher Nelson MRN: [id number] Date of Birth: 07-Jun-1940  Subjective/Objective:     Spoke with patient at bedside. Confirmed plan for OP PT, already arranged. Needs a RW, contacted [facility] to deliver to the room. [phone]               Action/Plan:   Expected Discharge Date:  05/16/18               Expected Discharge Plan:  OP Rehab  In-House Referral:  NA  Discharge planning Services  CM Consult  Post Acute Care Choice:  Durable Medical Equipment Choice offered to:  Patient  DME Arranged:  Gilford Rile rolling DME Agency:  [employer]  HH Arranged:  NA HH Agency:  NA  Status of Service:  Completed, signed off  If discussed at [facility] of Stay Meetings, dates discussed:    Additional Comments:  Guadalupe Maple, RN 05/16/2018, 9:27 AM

## 2018-05-16 NOTE — Care Management Obs Status (Signed)
[facility] NOTIFICATION   Patient Details  Name: Christopher Nelson MRN: [id number] Date of Birth: 30-Jan-1941   Medicare Observation Status Notification Given:  Yes    Guadalupe Maple, RN 05/16/2018, 2:24 PM

## 2018-05-16 NOTE — Progress Notes (Signed)
PATIENT ID: RAHSHAWN REMO  MRN: [id number]  DOB/AGE:  11-27-1940 / 78 y.o.  1 Day Post-Op Procedure(s) (LRB): TOTAL HIP ARTHROPLASTY ANTERIOR APPROACH (Right)    PROGRESS NOTE Subjective: Patient is alert, oriented, no Nausea, no Vomiting, yes passing gas, . Taking PO well. Denies SOB, Chest or Calf Pain. Using Incentive Spirometer, PAS in place. Ambulate WBAT with pt walking 80 ft Patient reports pain as  0/10  .    Objective: Vital signs in last 24 hours: Vitals:   05/15/18 1717 05/15/18 2200 05/16/18 0204 05/16/18 0557  BP: (!) 169/83 139/85 139/88 (!) 141/86  Pulse: 76 76 76 66  Resp: 16 17 16 17   Temp: 97.9 F (36.6 C) 98.2 F (36.8 C) (!) 97.5 F (36.4 C) (!) 97.5 F (36.4 C)  TempSrc:  Oral Oral Oral  SpO2: 97% 98% 96% 96%  Weight:      Height:          Intake/Output from previous day: I/O last 3 completed shifts: In: 4732.3 [P.O.:940; I.V.:3692.3; IV Piggyback:100] Out: 9163 [Urine:1525; Blood:150]   Intake/Output this shift: Total I/O In: 240 [P.O.:240] Out: -    LABORATORY DATA: Recent Labs    05/16/18 0414  WBC 15.5*  HGB 11.8*  HCT 36.0*  PLT 172  NA 134*  K 4.5  CL 104  CO2 21*  BUN 30*  CREATININE 1.39*  GLUCOSE 302*  CALCIUM 8.2*    Examination: Neurologically intact Neurovascular intact Sensation intact distally Intact pulses distally Dorsiflexion/Plantar flexion intact Incision: dressing C/D/I No cellulitis present Compartment soft} XR AP&Lat of hip shows well placed\fixed THA  Assessment:   1 Day Post-Op Procedure(s) (LRB): TOTAL HIP ARTHROPLASTY ANTERIOR APPROACH (Right) ADDITIONAL DIAGNOSIS:  Expected Acute Blood Loss Anemia, CAD  Plan: PT/OT WBAT, THA  DVT Prophylaxis: SCDx72 hrs, ASA 81 mg BID x 2 weeks  DISCHARGE PLAN: Home, later today  DISCHARGE NEEDS: HHPT, Walker and 3-in-1 comode seat

## 2018-05-17 ENCOUNTER — Encounter (HOSPITAL_COMMUNITY): Payer: Self-pay | Admitting: Orthopedic Surgery

## 2018-05-19 NOTE — Discharge Summary (Signed)
Patient ID: Christopher Nelson MRN: [id number] DOB/AGE: August 18, 1940 78 y.o.  Admit date: 05/15/2018 Discharge date: 05/19/2018  Admission Diagnoses:  Active Problems:   Status post total hip replacement, right   Discharge Diagnoses:  Same  Past Medical History:  Diagnosis Date  . Arthritis   . CKD (chronic kidney disease), stage III ([place])    patient denies  . Coronary artery disease    a. unstable angina s/p cath 01/2017 -  specifically DES to a large branching ramus intermedius. Residual disease includes ostial occlusion of the LAD with right-to-left collaterals, also 90% posterolateral stenosis. EF 35-40% by cath but then 55-60% by echo.  . Dysrhythmia    heart rests a beat  . Essential hypertension   . GERD (gastroesophageal reflux disease)    pt. denies  . Gout   . Heart murmur    pt. denies  . History of kidney stones   . Ischemic cardiomyopathy    a. EF 35-40% by cath then 55-60% by echo shortly after in 01/2017.  . Mitral valve disease    a. 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  . Pre-diabetes    patient denies    Surgeries: Procedure(s): TOTAL HIP ARTHROPLASTY ANTERIOR APPROACH on 05/15/2018   Consultants:   Discharged Condition: Improved  Hospital Course: Christopher Nelson is an 78 y.o. male who was admitted 05/15/2018 for operative treatment of<principal problem not specified>. Patient has severe unremitting pain that affects sleep, daily activities, and work/hobbies. After pre-op clearance the patient was taken to the operating room on 05/15/2018 and underwent  Procedure(s): TOTAL HIP ARTHROPLASTY ANTERIOR APPROACH.    Patient was given perioperative antibiotics:  Anti-infectives (From admission, onward)   Start     Dose/Rate Route Frequency Ordered Stop   05/15/18 0930  ceFAZolin (ANCEF) IVPB 2g/100 mL premix     2 g 200 mL/hr over 30 Minutes Intravenous On call to O.R. 05/15/18 9326 05/15/18 0959   05/15/18 0825  ceFAZolin (ANCEF) 2-4 GM/100ML-% IVPB    Note to Pharmacy:  Christopher Nelson   : cabinet override      05/15/18 0825 05/15/18 0959       Patient was given sequential compression devices, early ambulation, and chemoprophylaxis to prevent DVT.  Patient benefited maximally from hospital stay and there were no complications.    Recent vital signs: No data found.   Recent laboratory studies: No results for input(s): WBC, HGB, HCT, PLT, NA, K, CL, CO2, BUN, CREATININE, GLUCOSE, INR, CALCIUM in the last 72 hours.  Invalid input(s): PT, 2   Discharge Medications:   Allergies as of 05/16/2018   No Known Allergies     Medication List    STOP taking these medications   acetaminophen 325 MG tablet Commonly known as:  TYLENOL     TAKE these medications   amLODipine 5 MG tablet Commonly known as:  NORVASC Take 1 tablet (5 mg total) by mouth daily.   aspirin EC 81 MG tablet Take 1 tablet (81 mg total) by mouth 2 (two) times daily. What changed:  when to take this   atorvastatin 40 MG tablet Commonly known as:  LIPITOR TAKE 1 TABLET BY MOUTH ONCE DAILY   b complex vitamins tablet Take 1 tablet by mouth daily.   clopidogrel 75 MG tablet Commonly known as:  PLAVIX TAKE 1 TABLET BY MOUTH ONCE DAILY   CoQ-10 200 MG Caps Take 200 mg by mouth daily.   losartan-hydrochlorothiazide  50-12.5 MG tablet Commonly known as:  HYZAAR Take 0.5 tablets by mouth daily.   Lysine 1000 MG Tabs Take 1,000 mg by mouth daily.   MAGNESIUM PO Take 250 mg by mouth daily.   metoprolol succinate 50 MG 24 hr tablet Commonly known as:  TOPROL-XL Take 25 mg by mouth daily. Take with or immediately following a meal.   metoprolol succinate 25 MG 24 hr tablet Commonly known as:  TOPROL-XL TAKE 1 TABLET BY MOUTH ONCE DAILY   oxyCODONE-acetaminophen 5-325 MG tablet Commonly known as:  PERCOCET/ROXICET Take 1 tablet by mouth every 4 (four) hours as needed for severe pain.   ranitidine  150 MG tablet Commonly known as:  ZANTAC Take 150 mg by mouth daily.   tiZANidine 2 MG tablet Commonly known as:  ZANAFLEX Take 1 tablet (2 mg total) by mouth every 6 (six) hours as needed.   VITAMIN C PO Take 1,000 mg by mouth daily.   Vitamin D-3 25 MCG (1000 UT) Caps Take 1,000 Units by mouth daily.   zinc gluconate 50 MG tablet Take 50 mg by mouth daily.            Discharge Care Instructions  (From admission, onward)         Start     Ordered   05/16/18 0000  Weight bearing as tolerated     05/16/18 0854          Diagnostic Studies: Dg C-arm 1-60 Min-no Report  Result Date: 05/15/2018 Fluoroscopy was utilized by the requesting physician.  No radiographic interpretation.   Dg Hip Operative Unilat W Or W/o Pelvis Right  Result Date: 05/15/2018 CLINICAL DATA:  Hip surgery. EXAM: OPERATIVE RIGHT HIP (WITH PELVIS IF PERFORMED) 6 VIEWS TECHNIQUE: Fluoroscopic spot image(s) were submitted for interpretation post-operatively. COMPARISON:  CT 06/09/2017. FINDINGS: Total right hip replacement anatomic alignment. Hardware intact. 0 minutes 18 seconds fluoroscopy time. 1.5 mGy radiation dose. IMPRESSION: Total right hip replacement with anatomic alignment. Electronically Signed   By: Marcello Moores  Register   On: 05/15/2018 11:31    Disposition:   Discharge Instructions    Call MD / Call 911   Complete by:  As directed    If you experience chest pain or shortness of breath, CALL 911 and be transported to the hospital emergency room.  If you develope a fever above 101 F, pus (white drainage) or increased drainage or redness at the wound, or calf pain, call your surgeon's office.   Constipation Prevention   Complete by:  As directed    Drink plenty of fluids.  Prune juice may be helpful.  You may use a stool softener, such as Colace (over the counter) 100 mg twice a day.  Use MiraLax (over the counter) for constipation as needed.   Diet - low sodium heart healthy   Complete by:   As directed    Driving restrictions   Complete by:  As directed    No driving for 2 weeks   Increase activity slowly as tolerated   Complete by:  As directed    Patient may shower   Complete by:  As directed    You may shower without a dressing once there is no drainage.  Do not wash over the wound.  If drainage remains, cover wound with plastic wrap and then shower.   Weight bearing as tolerated   Complete by:  As directed       Follow-up Information    Frederik Pear, MD. Daphane Shepherd  on 05/30/2018.   Specialty:  Orthopedic Surgery Why:  Your appointment has been made for 1000 am Contact information: [place] [address] [phone]        [facility]. Go on 05/18/2018.   Why:  You are scheduled to start OPPT on 05/18/2018 @ 11:15. Please arrive a few minutes early to complete your paperwork.  Contact information: [phone]       Frederik Pear, MD In 2 weeks.   Specialty:  Orthopedic Surgery Contact information: [address] [place] [address] [phone]            Signed: Joanell Rising 05/19/2018, 11:41 AM

## 2018-05-30 DIAGNOSIS — M1611 Unilateral primary osteoarthritis, right hip: Secondary | ICD-10-CM | POA: Diagnosis not present

## 2018-06-21 NOTE — Progress Notes (Signed)
Cardiology Office Note  Date: 06/22/2018   ID: Ryshawn, [place] June 13, 1940, MRN [id number]  PCP: Rory Percy, MD  Primary Cardiologist: Rozann Lesches, MD   Chief Complaint  Patient presents with  . Coronary Artery Disease    History of Present Illness: Christopher Nelson is a 78 y.o. male last seen in August 2019.  He is here with his wife for a follow-up visit.  Overall states that he has been doing well, recuperating from right hip replacement.  He has had no angina symptoms or increasing shortness of breath beyond NYHA class II.  Follow-up echocardiogram in September 2019 revealed LVEF 60 to 65%, mild aortic regurgitation, and moderate mitral regurgitation which was overall stable.  I reviewed his cardiac medications which are stable and outlined below.  Past Medical History:  Diagnosis Date  . Arthritis   . CKD (chronic kidney disease), stage III ([place])    patient denies  . Coronary artery disease    a. unstable angina s/p cath 01/2017 -  specifically DES to a large branching ramus intermedius. Residual disease includes ostial occlusion of the LAD with right-to-left collaterals, also 90% posterolateral stenosis. EF 35-40% by cath but then 55-60% by echo.  . Essential hypertension   . GERD (gastroesophageal reflux disease)   . Gout   . History of kidney stones   . Ischemic cardiomyopathy    a. EF 35-40% by cath then 55-60% by echo shortly after in 01/2017.  . Mitral valve disease    a. 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  . Pre-diabetes     Past Surgical History:  Procedure Laterality Date  . CARDIAC CATHETERIZATION  01/06/2017  . cataract s Left 04/2017  . CHOLECYSTECTOMY    . CORONARY STENT INTERVENTION  01/06/2017   CORONARY STENT INTERVENTION  . CORONARY STENT INTERVENTION N/A 01/06/2017   Procedure: CORONARY STENT INTERVENTION;  Surgeon: Troy Sine, MD;  Location: [facility];  Service:  Cardiovascular;  Laterality: N/A;  . LEFT HEART CATH AND CORONARY ANGIOGRAPHY N/A 01/06/2017   Procedure: LEFT HEART CATH AND CORONARY ANGIOGRAPHY;  Surgeon: Troy Sine, MD;  Location: [facility];  Service: Cardiovascular;  Laterality: N/A;  . LITHOTRIPSY    . TONSILLECTOMY    . TOTAL HIP ARTHROPLASTY Right 05/15/2018   Procedure: TOTAL HIP ARTHROPLASTY ANTERIOR APPROACH;  Surgeon: Frederik Pear, MD;  Location: [facility];  Service: Orthopedics;  Laterality: Right;    Current Outpatient Medications  Medication Sig Dispense Refill  . amLODipine (NORVASC) 5 MG tablet Take 1 tablet (5 mg total) by mouth daily. 180 tablet 3  . Ascorbic Acid (VITAMIN C PO) Take 1,000 mg by mouth daily.     Marland Kitchen aspirin EC 81 MG tablet Take 1 tablet (81 mg total) by mouth 2 (two) times daily. 60 tablet 0  . atorvastatin (LIPITOR) 40 MG tablet TAKE 1 TABLET BY MOUTH ONCE DAILY (Patient taking differently: Take 40 mg by mouth daily. ) 90 tablet 3  . b complex vitamins tablet Take 1 tablet by mouth daily.    . Cholecalciferol (VITAMIN D-3) 1000 units CAPS Take 1,000 Units by mouth daily.    . clopidogrel (PLAVIX) 75 MG tablet TAKE 1 TABLET BY MOUTH ONCE DAILY (Patient taking differently: Take 75 mg by mouth daily. ) 90 tablet 2  . Coenzyme Q10 (COQ-10) 200 MG CAPS Take 200 mg by mouth daily.     Marland Kitchen losartan-hydrochlorothiazide (  HYZAAR) 50-12.5 MG tablet Take 0.5 tablets by mouth daily.    Marland Kitchen Lysine 1000 MG TABS Take 1,000 mg by mouth daily.     Marland Kitchen MAGNESIUM PO Take 250 mg by mouth daily.     . metoprolol succinate (TOPROL-XL) 25 MG 24 hr tablet TAKE 1 TABLET BY MOUTH ONCE DAILY 90 tablet 0  . metoprolol succinate (TOPROL-XL) 50 MG 24 hr tablet Take 25 mg by mouth daily. Take with or immediately following a meal.    . oxyCODONE-acetaminophen (PERCOCET/ROXICET) 5-325 MG tablet Take 1 tablet by mouth every 4 (four) hours as needed for severe pain. 30 tablet 0  . ranitidine (ZANTAC) 150 MG tablet Take 150 mg by mouth  daily.     Marland Kitchen tiZANidine (ZANAFLEX) 2 MG tablet Take 1 tablet (2 mg total) by mouth every 6 (six) hours as needed. 60 tablet 0  . zinc gluconate 50 MG tablet Take 50 mg by mouth daily.     No current facility-administered medications for this visit.    Allergies:  Patient has no known allergies.   Social History: The patient  reports that he has never smoked. He has never used smokeless tobacco. He reports that he does not drink alcohol or use drugs.   ROS:  Please see the history of present illness. Otherwise, complete review of systems is positive for none.  All other systems are reviewed and negative.   Physical Exam: VS:  BP (!) 142/70   Pulse 77   Ht 6\' 2"  (1.88 m)   Wt 239 lb (108.4 kg)   SpO2 97%   BMI 30.69 kg/m , BMI Body mass index is 30.69 kg/m.  Wt Readings from Last 3 Encounters:  06/22/18 239 lb (108.4 kg)  05/15/18 230 lb (104.3 kg)  02/07/18 232 lb (105.2 kg)    General: Elderly male, appears comfortable at rest. HEENT: Conjunctiva and lids normal, oropharynx clear. Neck: Supple, no elevated JVP or carotid bruits, no thyromegaly. Lungs: Clear to auscultation, nonlabored breathing at rest. Cardiac: Regular rate and rhythm, no S3, 2/6 mid-to-late apical systolic murmur, no pericardial rub. Abdomen: Soft, nontender, bowel sounds present. Extremities: No pitting edema, distal pulses 2+. Skin: Warm and dry. Musculoskeletal: No kyphosis. Neuropsychiatric: Alert and oriented x3, affect grossly appropriate.  ECG: I personally reviewed the tracing from 09/20/2017 which showed sinus rhythm with prolonged PR interval, right bundle branch block, and left anterior fascicular block rule out old inferior infarct pattern.  Recent Labwork: 05/16/2018: BUN 30; Creatinine, Ser 1.39; Hemoglobin 11.8; Platelets 172; Potassium 4.5; Sodium 134   Other Studies Reviewed Today:  Echocardiogram 01/04/2018: Study Conclusions  - Left ventricle: The cavity size was normal. Wall  thickness was   increased in a pattern of mild LVH. There was focal basal   hypertrophy. Systolic function was normal. The estimated ejection   fraction was in the range of 60% to 65%. Wall motion was normal;   there were no regional wall motion abnormalities. Doppler   parameters are consistent with abnormal left ventricular   relaxation (grade 1 diastolic dysfunction). Doppler parameters   are consistent with high ventricular filling pressure. - Aortic valve: Mildly calcified annulus. Trileaflet; mildly   thickened leaflets. There was mild regurgitation. Valve area   (VTI): 3.37 cm^2. Valve area (Vmax): 2.99 cm^2. Valve area   (Vmean): 2.93 cm^2. - Mitral valve: Mildly calcified annulus. Mildly thickened leaflets   . There was moderate regurgitation. Valve area by continuity   equation (using LVOT flow): 2.49 cm^2. -  Left atrium: The atrium was moderately dilated. - Technically adequate study.  Assessment and Plan:  1.  CAD status post DES to the ramus intermedius in September 2018.  He is doing well with no active angina symptoms on medical therapy.  No changes made to current regimen.  2.  Myxomatous mitral valve with stable, moderate mitral regurgitation by follow-up echocardiogram in September 2019.  3.  Essential hypertension, continue current antihypertensive regimen and keep follow-up with PCP.  4.  Mixed hyperlipidemia, remains on Lipitor.  Current medicines were reviewed with the patient today.   Disposition: Follow-up in 6 months.  Signed, Satira Sark, MD, [facility] 06/22/2018 3:29 PM    [facility], [place], [place] [address] Phone: [phone]; Fax: [phone]

## 2018-06-22 ENCOUNTER — Ambulatory Visit (INDEPENDENT_AMBULATORY_CARE_PROVIDER_SITE_OTHER): Payer: Medicare Other | Admitting: Cardiology

## 2018-06-22 ENCOUNTER — Encounter: Payer: Self-pay | Admitting: Cardiology

## 2018-06-22 VITALS — BP 142/70 | HR 77 | Ht 74.0 in | Wt 239.0 lb

## 2018-06-22 DIAGNOSIS — I25119 Atherosclerotic heart disease of native coronary artery with unspecified angina pectoris: Secondary | ICD-10-CM | POA: Diagnosis not present

## 2018-06-22 DIAGNOSIS — I10 Essential (primary) hypertension: Secondary | ICD-10-CM

## 2018-06-22 DIAGNOSIS — E782 Mixed hyperlipidemia: Secondary | ICD-10-CM

## 2018-06-22 DIAGNOSIS — I059 Rheumatic mitral valve disease, unspecified: Secondary | ICD-10-CM

## 2018-06-22 NOTE — Patient Instructions (Addendum)

## 2018-07-20 ENCOUNTER — Other Ambulatory Visit: Payer: Self-pay | Admitting: Cardiology

## 2018-08-03 DIAGNOSIS — M25551 Pain in right hip: Secondary | ICD-10-CM | POA: Diagnosis not present

## 2018-08-28 DIAGNOSIS — H538 Other visual disturbances: Secondary | ICD-10-CM | POA: Diagnosis not present

## 2018-08-28 DIAGNOSIS — H35033 Hypertensive retinopathy, bilateral: Secondary | ICD-10-CM | POA: Diagnosis not present

## 2018-11-30 ENCOUNTER — Other Ambulatory Visit: Payer: Self-pay

## 2018-12-04 DIAGNOSIS — Z20828 Contact with and (suspected) exposure to other viral communicable diseases: Secondary | ICD-10-CM | POA: Diagnosis not present

## 2018-12-04 DIAGNOSIS — E119 Type 2 diabetes mellitus without complications: Secondary | ICD-10-CM | POA: Diagnosis not present

## 2018-12-04 DIAGNOSIS — Z955 Presence of coronary angioplasty implant and graft: Secondary | ICD-10-CM | POA: Diagnosis not present

## 2018-12-04 DIAGNOSIS — I10 Essential (primary) hypertension: Secondary | ICD-10-CM | POA: Diagnosis not present

## 2018-12-04 DIAGNOSIS — I2510 Atherosclerotic heart disease of native coronary artery without angina pectoris: Secondary | ICD-10-CM | POA: Diagnosis not present

## 2019-01-01 ENCOUNTER — Other Ambulatory Visit: Payer: Self-pay | Admitting: Physician Assistant

## 2019-01-24 ENCOUNTER — Other Ambulatory Visit: Payer: Self-pay | Admitting: *Deleted

## 2019-01-24 MED ORDER — CLOPIDOGREL BISULFATE 75 MG PO TABS: 75.0000 mg | ORAL_TABLET | Freq: Every day | ORAL | 0 refills | Status: DC

## 2019-02-02 ENCOUNTER — Ambulatory Visit (INDEPENDENT_AMBULATORY_CARE_PROVIDER_SITE_OTHER): Payer: Medicare Other | Admitting: Cardiology

## 2019-02-02 ENCOUNTER — Encounter: Payer: Self-pay | Admitting: Cardiology

## 2019-02-02 ENCOUNTER — Other Ambulatory Visit: Payer: Self-pay

## 2019-02-02 ENCOUNTER — Telehealth: Payer: Self-pay | Admitting: Cardiology

## 2019-02-02 VITALS — BP 120/78 | HR 71 | Ht 74.0 in | Wt 242.0 lb

## 2019-02-02 DIAGNOSIS — I10 Essential (primary) hypertension: Secondary | ICD-10-CM | POA: Diagnosis not present

## 2019-02-02 DIAGNOSIS — I059 Rheumatic mitral valve disease, unspecified: Secondary | ICD-10-CM | POA: Diagnosis not present

## 2019-02-02 DIAGNOSIS — I25119 Atherosclerotic heart disease of native coronary artery with unspecified angina pectoris: Secondary | ICD-10-CM | POA: Diagnosis not present

## 2019-02-02 DIAGNOSIS — E782 Mixed hyperlipidemia: Secondary | ICD-10-CM

## 2019-02-02 NOTE — Telephone Encounter (Signed)
°  Precert needed for: echo - mitral regurgitation   Location: [facility]    Date: Mar 08, 2019

## 2019-02-02 NOTE — Progress Notes (Signed)
Cardiology Office Note  Date: 02/02/2019   ID: Yaasir, Trotta 11-Aug-1940, MRN [id number]  PCP:  Rory Percy, MD  Cardiologist:  Rozann Lesches, MD Electrophysiologist:  None   Chief Complaint  Patient presents with  . Cardiac follow-up    History of Present Illness: Christopher Nelson is a 78 y.o. male last seen in February.  He is here for a routine follow-up visit.  Since last evaluation he does not report any active angina symptoms, states that he feels fairly well, NYHA class II dyspnea and good energy with routine activities.  I went over his medications which are outlined below.  We still had Lipitor on his list, but he states that he had not taken that for quite some time concerned about side effects.  I asked that he get a follow-up lipid panel with his PCP and we might be able to consider an alternate medication.  We also discussed stopping Plavix at this point.  I personally reviewed his ECG today which shows sinus rhythm with left anterior fascicular block and nonspecific T wave changes.  We discussed obtaining a follow-up echocardiogram for follow-up of mitral regurgitation.  Past Medical History:  Diagnosis Date  . Arthritis   . CKD (chronic kidney disease), stage III   . Coronary artery disease    a. unstable angina s/p cath 01/2017 -  specifically DES to a large branching ramus intermedius. Residual disease includes ostial occlusion of the LAD with right-to-left collaterals, also 90% posterolateral stenosis. EF 35-40% by cath but then 55-60% by echo.  . Essential hypertension   . GERD (gastroesophageal reflux disease)   . Gout   . History of kidney stones   . Ischemic cardiomyopathy    a. EF 35-40% by cath then 55-60% by echo shortly after in 01/2017.  . Mitral valve disease    a. 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  . Pre-diabetes     Past Surgical History:  Procedure Laterality Date  .  CARDIAC CATHETERIZATION  01/06/2017  . cataract s Left 04/2017  . CHOLECYSTECTOMY    . CORONARY STENT INTERVENTION  01/06/2017   CORONARY STENT INTERVENTION  . CORONARY STENT INTERVENTION N/A 01/06/2017   Procedure: CORONARY STENT INTERVENTION;  Surgeon: Troy Sine, MD;  Location: [facility];  Service: Cardiovascular;  Laterality: N/A;  . LEFT HEART CATH AND CORONARY ANGIOGRAPHY N/A 01/06/2017   Procedure: LEFT HEART CATH AND CORONARY ANGIOGRAPHY;  Surgeon: Troy Sine, MD;  Location: [facility];  Service: Cardiovascular;  Laterality: N/A;  . LITHOTRIPSY    . TONSILLECTOMY    . TOTAL HIP ARTHROPLASTY Right 05/15/2018   Procedure: TOTAL HIP ARTHROPLASTY ANTERIOR APPROACH;  Surgeon: Frederik Pear, MD;  Location: [facility];  Service: Orthopedics;  Laterality: Right;    Current Outpatient Medications  Medication Sig Dispense Refill  . amLODipine (NORVASC) 5 MG tablet Take 1 tablet by mouth once daily 90 tablet 0  . Ascorbic Acid (VITAMIN C PO) Take 1,000 mg by mouth daily.     Marland Kitchen aspirin EC 81 MG tablet Take 1 tablet (81 mg total) by mouth 2 (two) times daily. 60 tablet 0  . b complex vitamins tablet Take 1 tablet by mouth daily.    . Cholecalciferol (VITAMIN D-3) 1000 units CAPS Take 1,000 Units by mouth daily.    . Coenzyme Q10 (COQ-10) 200 MG CAPS Take 200 mg by mouth daily.     Marland Kitchen  losartan-hydrochlorothiazide (HYZAAR) 50-12.5 MG tablet Take 0.5 tablets by mouth daily.    Marland Kitchen Lysine 1000 MG TABS Take 1,000 mg by mouth daily.     Marland Kitchen MAGNESIUM PO Take 250 mg by mouth daily.     . metoprolol succinate (TOPROL-XL) 25 MG 24 hr tablet TAKE 1 TABLET BY MOUTH ONCE DAILY 90 tablet 0  . oxyCODONE-acetaminophen (PERCOCET/ROXICET) 5-325 MG tablet Take 1 tablet by mouth every 4 (four) hours as needed for severe pain. 30 tablet 0  . ranitidine (ZANTAC) 150 MG tablet Take 150 mg by mouth daily.     Marland Kitchen tiZANidine (ZANAFLEX) 2 MG tablet Take 1 tablet (2 mg total) by mouth every 6 (six) hours as  needed. 60 tablet 0  . zinc gluconate 50 MG tablet Take 50 mg by mouth daily.     No current facility-administered medications for this visit.    Allergies:  Patient has no known allergies.   Social History: The patient  reports that he has never smoked. He has never used smokeless tobacco. He reports that he does not drink alcohol or use drugs.   Family History: The patient's family history includes Diabetes Mellitus II in his sister; Heart attack in his brother; Pancreatic cancer in his sister; Uterine cancer in his mother.   ROS:  Please see the history of present illness. Otherwise, complete review of systems is positive for none.  All other systems are reviewed and negative.   Physical Exam: VS:  BP 120/78   Pulse 71   Ht 6\' 2"  (1.88 m)   Wt 242 lb (109.8 kg)   SpO2 98%   BMI 31.07 kg/m , BMI Body mass index is 31.07 kg/m.  Wt Readings from Last 3 Encounters:  02/02/19 242 lb (109.8 kg)  06/22/18 239 lb (108.4 kg)  05/15/18 230 lb (104.3 kg)    General: Elderly male, appears comfortable at rest. HEENT: Conjunctiva and lids normal, wearing a mask. Neck: Supple, no elevated JVP or carotid bruits, no thyromegaly. Lungs: Clear to auscultation, nonlabored breathing at rest. Cardiac: Regular rate and rhythm, no S3, 2/6 apical systolic murmur, no pericardial rub. Abdomen: Soft, nontender, bowel sounds present. Extremities: No pitting edema, distal pulses 2+. Skin: Warm and dry. Musculoskeletal: No kyphosis. Neuropsychiatric: Alert and oriented x3, affect grossly appropriate.  ECG:  An ECG dated 09/20/2017 was personally reviewed today and demonstrated:  Sinus rhythm with prolonged PR interval, right lateral branch block, left anterior fascicular block rule out old inferior infarct pattern.  Recent Labwork: 05/16/2018: BUN 30; Creatinine, Ser 1.39; Hemoglobin 11.8; Platelets 172; Potassium 4.5; Sodium 134   Other Studies Reviewed Today:  Echocardiogram 01/04/2018: Study  Conclusions  - Left ventricle: The cavity size was normal. Wall thickness was increased in a pattern of mild LVH. There was focal basal hypertrophy. Systolic function was normal. The estimated ejection fraction was in the range of 60% to 65%. Wall motion was normal; there were no regional wall motion abnormalities. Doppler parameters are consistent with abnormal left ventricular relaxation (grade 1 diastolic dysfunction). Doppler parameters are consistent with high ventricular filling pressure. - Aortic valve: Mildly calcified annulus. Trileaflet; mildly thickened leaflets. There was mild regurgitation. Valve area (VTI): 3.37 cm^2. Valve area (Vmax): 2.99 cm^2. Valve area (Vmean): 2.93 cm^2. - Mitral valve: Mildly calcified annulus. Mildly thickened leaflets . There was moderate regurgitation. Valve area by continuity equation (using LVOT flow): 2.49 cm^2. - Left atrium: The atrium was moderately dilated. - Technically adequate study.  Assessment and Plan:  1.  CAD status post DES to the ramus intermedius in September 2018.  He reports no progressive angina on medical therapy, we will stop Plavix at this point.  ECG reviewed.  2.  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  Follow-up echocardiogram will be obtained in comparison to last year's study.  3.  Mixed hyperlipidemia, he stopped Lipitor previously concerned about side effects.  I recommended that he get a follow-up lipid panel with PCP and we could consider an alternative medication.  4.  Essential hypertension, blood pressure is well controlled today.  Medication Adjustments/Labs and Tests Ordered: Current medicines are reviewed at length with the patient today.  Concerns regarding medicines are outlined above.   Tests Ordered: Orders Placed This Encounter  Procedures  . EKG 12-Lead  . ECHOCARDIOGRAM COMPLETE    Medication Changes: No orders of the defined types were placed  in this encounter.   Disposition:  Follow up 6 months in the [place] office.  Signed, Satira Sark, MD, [facility] 02/02/2019 4:38 PM    [facility] at [address], [place], [place] [address] Phone: [phone]; Fax: [phone]

## 2019-02-02 NOTE — Patient Instructions (Signed)
Medication Instructions:   Stop Lipitor (Atorvastatin).  Stop Plavix (Clopidogrel) after finish current supply.    Continue all other medications.    Labwork: none  Testing/Procedures:  Your physician has requested that you have an echocardiogram. Echocardiography is a painless test that uses sound waves to create images of your heart. It provides your doctor with information about the size and shape of your heart and how well your heart's chambers and valves are working. This procedure takes approximately one hour. There are no restrictions for this procedure.  Office will contact with results via phone or letter.    Follow-Up: Your physician wants you to follow up in: 6 months.  You will receive a reminder letter in the mail one-two months in advance.  If you don't receive a letter, please call our office to schedule the follow up appointment   Any Other Special Instructions Will Be Listed Below (If Applicable).  If you need a refill on your cardiac medications before your next appointment, please call your pharmacy.

## 2019-02-14 DIAGNOSIS — Z961 Presence of intraocular lens: Secondary | ICD-10-CM | POA: Diagnosis not present

## 2019-03-08 ENCOUNTER — Other Ambulatory Visit: Payer: Self-pay

## 2019-03-08 ENCOUNTER — Ambulatory Visit (INDEPENDENT_AMBULATORY_CARE_PROVIDER_SITE_OTHER): Payer: Medicare Other

## 2019-03-08 DIAGNOSIS — I059 Rheumatic mitral valve disease, unspecified: Secondary | ICD-10-CM

## 2019-03-12 ENCOUNTER — Telehealth: Payer: Self-pay | Admitting: *Deleted

## 2019-03-12 NOTE — Telephone Encounter (Signed)
-----   Message from Satira Sark, MD sent at 03/09/2019  7:59 AM EST ----- Results reviewed.  LVEF normal at 55 to 60%.  Mitral regurgitation remains moderate range as before with prolapse of the posterior leaflet.  Continue with current follow-up plan.

## 2019-03-12 NOTE — Telephone Encounter (Signed)
Patient informed. Copy sent to PCP °

## 2019-03-16 DIAGNOSIS — Z23 Encounter for immunization: Secondary | ICD-10-CM | POA: Diagnosis not present

## 2019-03-30 ENCOUNTER — Other Ambulatory Visit: Payer: Self-pay | Admitting: Cardiology

## 2019-04-17 ENCOUNTER — Other Ambulatory Visit: Payer: Self-pay | Admitting: Cardiology

## 2019-04-23 ENCOUNTER — Other Ambulatory Visit: Payer: Self-pay | Admitting: Cardiology

## 2019-04-28 ENCOUNTER — Other Ambulatory Visit: Payer: Self-pay | Admitting: Cardiology

## 2019-05-08 ENCOUNTER — Other Ambulatory Visit: Payer: Self-pay | Admitting: Cardiology

## 2019-05-08 NOTE — Telephone Encounter (Signed)
Patient reports he has not done lab work to check lipids yet and says he has been off atorvastatin since last visit. Reports he still has muscle and joint pain. Advised that since the problem continued after stopping atorvastatin, that it was less likely the cause of his symptoms. Advised that he should restart it. Advised patient that he should get his lab work first to check his lipids and have result sent to our office. Verbalized understanding of plan.

## 2019-05-08 NOTE — Telephone Encounter (Signed)
*  STAT* If patient is at the pharmacy, call can be transferred to refill team.   1. Which medications need to be refilled? Atorvastatine 40mg   1x daily  2. Which pharmacy/location (including street and city if local pharmacy) is medication to be sent to? [facility]  3. Do they need a 30 day or 90 day supply?

## 2019-05-25 DIAGNOSIS — E1165 Type 2 diabetes mellitus with hyperglycemia: Secondary | ICD-10-CM | POA: Diagnosis not present

## 2019-05-25 DIAGNOSIS — E782 Mixed hyperlipidemia: Secondary | ICD-10-CM | POA: Diagnosis not present

## 2019-05-25 DIAGNOSIS — I10 Essential (primary) hypertension: Secondary | ICD-10-CM | POA: Diagnosis not present

## 2019-05-25 DIAGNOSIS — R5383 Other fatigue: Secondary | ICD-10-CM | POA: Diagnosis not present

## 2019-05-28 DIAGNOSIS — E782 Mixed hyperlipidemia: Secondary | ICD-10-CM | POA: Diagnosis not present

## 2019-05-28 DIAGNOSIS — I10 Essential (primary) hypertension: Secondary | ICD-10-CM | POA: Diagnosis not present

## 2019-05-28 DIAGNOSIS — Z6830 Body mass index (BMI) 30.0-30.9, adult: Secondary | ICD-10-CM | POA: Diagnosis not present

## 2019-05-28 DIAGNOSIS — E1165 Type 2 diabetes mellitus with hyperglycemia: Secondary | ICD-10-CM | POA: Diagnosis not present

## 2019-05-28 DIAGNOSIS — I255 Ischemic cardiomyopathy: Secondary | ICD-10-CM | POA: Diagnosis not present

## 2019-05-28 DIAGNOSIS — Z0001 Encounter for general adult medical examination with abnormal findings: Secondary | ICD-10-CM | POA: Diagnosis not present

## 2019-05-28 DIAGNOSIS — I25119 Atherosclerotic heart disease of native coronary artery with unspecified angina pectoris: Secondary | ICD-10-CM | POA: Diagnosis not present

## 2019-05-28 DIAGNOSIS — I341 Nonrheumatic mitral (valve) prolapse: Secondary | ICD-10-CM | POA: Diagnosis not present

## 2019-06-29 DIAGNOSIS — Z23 Encounter for immunization: Secondary | ICD-10-CM | POA: Diagnosis not present

## 2019-06-29 DIAGNOSIS — E7849 Other hyperlipidemia: Secondary | ICD-10-CM | POA: Diagnosis not present

## 2019-06-29 DIAGNOSIS — I10 Essential (primary) hypertension: Secondary | ICD-10-CM | POA: Diagnosis not present

## 2019-07-27 DIAGNOSIS — Z23 Encounter for immunization: Secondary | ICD-10-CM | POA: Diagnosis not present

## 2019-08-03 DIAGNOSIS — J029 Acute pharyngitis, unspecified: Secondary | ICD-10-CM | POA: Diagnosis not present

## 2019-08-03 DIAGNOSIS — Z20822 Contact with and (suspected) exposure to covid-19: Secondary | ICD-10-CM | POA: Diagnosis not present

## 2019-08-03 DIAGNOSIS — R05 Cough: Secondary | ICD-10-CM | POA: Diagnosis not present

## 2019-08-03 DIAGNOSIS — R109 Unspecified abdominal pain: Secondary | ICD-10-CM | POA: Diagnosis not present

## 2019-08-03 DIAGNOSIS — J019 Acute sinusitis, unspecified: Secondary | ICD-10-CM | POA: Diagnosis not present

## 2019-08-03 DIAGNOSIS — J209 Acute bronchitis, unspecified: Secondary | ICD-10-CM | POA: Diagnosis not present

## 2019-08-03 DIAGNOSIS — I10 Essential (primary) hypertension: Secondary | ICD-10-CM | POA: Diagnosis not present

## 2019-08-06 NOTE — Progress Notes (Addendum)
Cardiology Office Note  Date: 08/07/2019   ID: Plez, Hundley 1940-07-31, MRN [id number]  PCP:  Rory Percy, MD  Cardiologist:  Rozann Lesches, MD Electrophysiologist:  None   Chief Complaint: Follow-up CAD, hypertension, MR.  History of Present Illness: Christopher Nelson is a 79 y.o. male with a history of CAD status post DES to ramus intermedius September 2018, myxomatous mitral valve with moderate mitral regurgitation, mixed hyperlipidemia, hypertension.  Last saw Dr. Domenic Polite on February 02, 2019.  He reported no progressive angina on current medical therapy.  His Plavix was stopped.  A follow-up echocardiogram was ordered to compare to previous study.  Patient had stopped Lipitor previously concerned about side effects.  He recommended patient get a follow-up lipid panel with PCP and would consider an alternative medication.  Patient's blood pressure was controlled.  Patient states he has been doing well from a cardiac standpoint.  He denies any recent progressive anginal or exertional symptoms.  Patient states he recently had what he thought to be a respiratory infection with increased temperature up to 103 degrees.  He sought care at an urgent care facility in [facility].  He was prescribed amoxicillin.  States he has recently had some diarrhea-like stools and it is dark almost black and tarry like since visiting the urgent care clinic.  His CBC was unremarkable at urgent care facility. No orthopnea ,PND, or lower extremity edema.   Past Medical History:  Diagnosis Date  . Arthritis   . CKD (chronic kidney disease), stage III   . Coronary artery disease    a. unstable angina s/p cath 01/2017 -  specifically DES to a large branching ramus intermedius. Residual disease includes ostial occlusion of the LAD with right-to-left collaterals, also 90% posterolateral stenosis. EF 35-40% by cath but then 55-60% by echo.  . Essential hypertension   . GERD (gastroesophageal  reflux disease)   . Gout   . History of kidney stones   . Ischemic cardiomyopathy    a. EF 35-40% by cath then 55-60% by echo shortly after in 01/2017.  . Mitral valve disease    a. 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  . Pre-diabetes     Past Surgical History:  Procedure Laterality Date  . CARDIAC CATHETERIZATION  01/06/2017  . cataract s Left 04/2017  . CHOLECYSTECTOMY    . CORONARY STENT INTERVENTION  01/06/2017   CORONARY STENT INTERVENTION  . CORONARY STENT INTERVENTION N/A 01/06/2017   Procedure: CORONARY STENT INTERVENTION;  Surgeon: Troy Sine, MD;  Location: [facility];  Service: Cardiovascular;  Laterality: N/A;  . LEFT HEART CATH AND CORONARY ANGIOGRAPHY N/A 01/06/2017   Procedure: LEFT HEART CATH AND CORONARY ANGIOGRAPHY;  Surgeon: Troy Sine, MD;  Location: [facility];  Service: Cardiovascular;  Laterality: N/A;  . LITHOTRIPSY    . TONSILLECTOMY    . TOTAL HIP ARTHROPLASTY Right 05/15/2018   Procedure: TOTAL HIP ARTHROPLASTY ANTERIOR APPROACH;  Surgeon: Frederik Pear, MD;  Location: [facility];  Service: Orthopedics;  Laterality: Right;    Current Outpatient Medications  Medication Sig Dispense Refill  . amLODipine (NORVASC) 5 MG tablet TAKE 1 TABLET BY MOUTH ONCE DAILY . APPOINTMENT REQUIRED FOR FUTURE REFILLS 90 tablet 3  . Ascorbic Acid (VITAMIN C PO) Take 1,000 mg by mouth daily.     Marland Kitchen aspirin EC 81 MG tablet Take 1 tablet (81 mg total) by mouth 2 (two) times  daily. 60 tablet 0  . b complex vitamins tablet Take 1 tablet by mouth daily.    . Cholecalciferol (VITAMIN D-3) 1000 units CAPS Take 1,000 Units by mouth daily.    . Coenzyme Q10 (COQ-10) 200 MG CAPS Take 200 mg by mouth daily.     Marland Kitchen losartan-hydrochlorothiazide (HYZAAR) 50-12.5 MG tablet Take 0.5 tablets by mouth daily.    Marland Kitchen Lysine 1000 MG TABS Take 1,000 mg by mouth daily.     Marland Kitchen MAGNESIUM PO Take 250 mg by mouth daily.     . metoprolol  succinate (TOPROL-XL) 25 MG 24 hr tablet TAKE 1 TABLET BY MOUTH ONCE DAILY 90 tablet 0  . oxyCODONE-acetaminophen (PERCOCET/ROXICET) 5-325 MG tablet Take 1 tablet by mouth every 4 (four) hours as needed for severe pain. 30 tablet 0  . ranitidine (ZANTAC) 150 MG tablet Take 150 mg by mouth daily.     Marland Kitchen tiZANidine (ZANAFLEX) 2 MG tablet Take 1 tablet (2 mg total) by mouth every 6 (six) hours as needed. 60 tablet 0  . zinc gluconate 50 MG tablet Take 50 mg by mouth daily.     No current facility-administered medications for this visit.   Allergies:  Patient has no known allergies.   Social History: The patient  reports that he has never smoked. He has never used smokeless tobacco. He reports that he does not drink alcohol or use drugs.   Family History: The patient's family history includes Diabetes Mellitus II in his sister; Heart attack in his brother; Pancreatic cancer in his sister; Uterine cancer in his mother.   ROS:  Please see the history of present illness. Otherwise, complete review of systems is positive for none.  All other systems are reviewed and negative.   Physical Exam: VS:  There were no vitals taken for this visit., BMI There is no height or weight on file to calculate BMI.  Wt Readings from Last 3 Encounters:  02/02/19 242 lb (109.8 kg)  06/22/18 239 lb (108.4 kg)  05/15/18 230 lb (104.3 kg)    General: Patient appears comfortable at rest. Neck: Supple, no elevated JVP or carotid bruits, no thyromegaly. Lungs: Clear to auscultation, nonlabored breathing at rest. Cardiac: Regular rate and rhythm, no S3 or significant systolic murmur, no pericardial rub. Extremities: No pitting edema, distal pulses 2+. Skin: Warm and dry. Musculoskeletal: No kyphosis. Neuropsychiatric: Alert and oriented x3, affect grossly appropriate.  ECG: None today  Recent Labwork: No results found for requested labs within last 8760 hours.  No results found for: CHOL, TRIG, HDL, CHOLHDL, VLDL,  LDLCALC, LDLDIRECT  Other Studies Reviewed Today:  Echocardiogram 03/08/2019 1. Left ventricular ejection fraction, by visual estimation, is 55 to 60%. The left ventricle has normal function. There is moderately increased left ventricular hypertrophy. 2. Elevated left atrial pressure. 3. Left ventricular diastolic parameters are consistent with Grade I diastolic dysfunction (impaired relaxation). 4. Global right ventricle has normal systolic function.The right ventricular size is normal. No increase in right ventricular wall thickness. 5. Left atrial size was moderately dilated. 6. Right atrial size was normal. 7. Mild aortic valve annular calcification. 8. The mitral valve is abnormal. Moderate mitral valve regurgitation. No evidence of mitral stenosis. 9. There is prolapse of the posterior mitral valve leaflet. The MR jet is eccentric and anterior, causing it to be difficult to evaluate and possibly underestimated. Probable moderate mitral regurgitation. 10. The tricuspid valve is normal in structure. Tricuspid valve regurgitation is not demonstrated. 11. The aortic valve is  tricuspid. Aortic valve regurgitation is mild. No evidence of aortic valve sclerosis or stenosis. 12. There is Mild calcification of the aortic valve. 13. There is Mild thickening of the aortic valve. 14. The pulmonic valve was not well visualized. Pulmonic valve regurgitation is mild. 15. Normal pulmonary artery systolic pressure. In comparison to the previous echocardiogram(s): Echocardiogram done 01/04/18 showed an EF of 65% with moderate MR.   Echocardiogram 01/04/2018: Study Conclusions  - Left ventricle: The cavity size was normal. Wall thickness was increased in a pattern of mild LVH. There was focal basal hypertrophy. Systolic function was normal. The estimated ejection fraction was in the range of 60% to 65%. Wall motion was normal; there were no regional wall motion abnormalities.  Doppler parameters are consistent with abnormal left ventricular relaxation (grade 1 diastolic dysfunction). Doppler parameters are consistent with high ventricular filling pressure. - Aortic valve: Mildly calcified annulus. Trileaflet; mildly thickened leaflets. There was mild regurgitation. Valve area (VTI): 3.37 cm^2. Valve area (Vmax): 2.99 cm^2. Valve area (Vmean): 2.93 cm^2. - Mitral valve: Mildly calcified annulus. Mildly thickened leaflets . There was moderate regurgitation. Valve area by continuity equation (using LVOT flow): 2.49 cm^2. - Left atrium: The atrium was moderately dilated. - Technically adequate study.  Assessment and Plan:  1. Coronary artery disease involving native coronary artery of native heart with unstable angina pectoris ([place])   2. Mixed hyperlipidemia   3. Mitral valve disease   4. Essential hypertension     1. Coronary artery disease involving native coronary artery of native heart with unstable angina pectoris [facility]) He denies any recent progressive anginal or exertional symptoms.  Patient states he recently had some black tarry stools with associated diarrhea.  Advised patient he could stop his aspirin for 2 to 3 days to see if symptoms resolve and upon resolution start back on aspirin 81 mg daily.  Advised him to follow-up with PCP if black tarry stools do not resolve.  Continue Toprol-XL 25 mg  2. Mixed hyperlipidemia Patient stopped his statin medication due to potential side effects.  Offered him Zetia during this visit.  Patient wants to defer for now.  He prefers to exercise lifestyle changes to improve hyperlipidemia.  3. Mitral valve disease Moderate on recent echo 03/08/2019.  Will monitor.  4. Essential hypertension Blood pressure is well controlled on current medications.  Continue Toprol-XL 25 mg, amlodipine 5 mg daily, losartan HCTZ 50/12.5 1/2 tablet daily   Medication Adjustments/Labs and Tests Ordered: Current  medicines are reviewed at length with the patient today.  Concerns regarding medicines are outlined above.   Disposition: Follow-up with Domenic Polite or APP 6 months  Signed, Levell July, NP 08/07/2019 7:54 AM    [facility] at [place], [place], [place] [address] Phone: [phone]; Fax: [phone]

## 2019-08-07 ENCOUNTER — Encounter: Payer: Self-pay | Admitting: *Deleted

## 2019-08-07 ENCOUNTER — Encounter: Payer: Self-pay | Admitting: Family Medicine

## 2019-08-07 ENCOUNTER — Other Ambulatory Visit: Payer: Self-pay

## 2019-08-07 ENCOUNTER — Ambulatory Visit (INDEPENDENT_AMBULATORY_CARE_PROVIDER_SITE_OTHER): Payer: Medicare Other | Admitting: Family Medicine

## 2019-08-07 VITALS — BP 130/70 | HR 88 | Ht 74.0 in | Wt 239.4 lb

## 2019-08-07 DIAGNOSIS — I10 Essential (primary) hypertension: Secondary | ICD-10-CM | POA: Diagnosis not present

## 2019-08-07 DIAGNOSIS — I25110 Atherosclerotic heart disease of native coronary artery with unstable angina pectoris: Secondary | ICD-10-CM | POA: Diagnosis not present

## 2019-08-07 DIAGNOSIS — I059 Rheumatic mitral valve disease, unspecified: Secondary | ICD-10-CM

## 2019-08-07 DIAGNOSIS — E782 Mixed hyperlipidemia: Secondary | ICD-10-CM | POA: Diagnosis not present

## 2019-08-07 NOTE — Patient Instructions (Addendum)

## 2019-08-21 DIAGNOSIS — K279 Peptic ulcer, site unspecified, unspecified as acute or chronic, without hemorrhage or perforation: Secondary | ICD-10-CM | POA: Diagnosis not present

## 2019-08-21 DIAGNOSIS — J029 Acute pharyngitis, unspecified: Secondary | ICD-10-CM | POA: Diagnosis not present

## 2019-08-21 DIAGNOSIS — Z1212 Encounter for screening for malignant neoplasm of rectum: Secondary | ICD-10-CM | POA: Diagnosis not present

## 2019-08-21 DIAGNOSIS — K921 Melena: Secondary | ICD-10-CM | POA: Diagnosis not present

## 2019-08-21 DIAGNOSIS — R1013 Epigastric pain: Secondary | ICD-10-CM | POA: Diagnosis not present

## 2019-08-21 DIAGNOSIS — K1379 Other lesions of oral mucosa: Secondary | ICD-10-CM | POA: Diagnosis not present

## 2019-08-27 ENCOUNTER — Encounter: Payer: Self-pay | Admitting: Gastroenterology

## 2019-08-29 ENCOUNTER — Emergency Department (HOSPITAL_COMMUNITY): Payer: Medicare Other

## 2019-08-29 ENCOUNTER — Inpatient Hospital Stay (HOSPITAL_COMMUNITY)
Admission: EM | Admit: 2019-08-29 | Discharge: 2019-08-31 | DRG: 379 | Disposition: A | Discharge status: Home / Self Care | Payer: Medicare Other | Attending: Internal Medicine | Admitting: Internal Medicine

## 2019-08-29 ENCOUNTER — Other Ambulatory Visit: Payer: Self-pay

## 2019-08-29 ENCOUNTER — Encounter (HOSPITAL_COMMUNITY): Payer: Self-pay | Admitting: Emergency Medicine

## 2019-08-29 DIAGNOSIS — K2980 Duodenitis without bleeding: Secondary | ICD-10-CM | POA: Diagnosis not present

## 2019-08-29 DIAGNOSIS — N400 Enlarged prostate without lower urinary tract symptoms: Secondary | ICD-10-CM | POA: Diagnosis present

## 2019-08-29 DIAGNOSIS — K921 Melena: Secondary | ICD-10-CM | POA: Diagnosis not present

## 2019-08-29 DIAGNOSIS — Z8249 Family history of ischemic heart disease and other diseases of the circulatory system: Secondary | ICD-10-CM

## 2019-08-29 DIAGNOSIS — K5750 Diverticulosis of both small and large intestine without perforation or abscess without bleeding: Secondary | ICD-10-CM | POA: Diagnosis present

## 2019-08-29 DIAGNOSIS — I129 Hypertensive chronic kidney disease with stage 1 through stage 4 chronic kidney disease, or unspecified chronic kidney disease: Secondary | ICD-10-CM | POA: Diagnosis present

## 2019-08-29 DIAGNOSIS — D649 Anemia, unspecified: Secondary | ICD-10-CM | POA: Diagnosis present

## 2019-08-29 DIAGNOSIS — I2510 Atherosclerotic heart disease of native coronary artery without angina pectoris: Secondary | ICD-10-CM | POA: Diagnosis present

## 2019-08-29 DIAGNOSIS — R509 Fever, unspecified: Secondary | ICD-10-CM | POA: Diagnosis not present

## 2019-08-29 DIAGNOSIS — R6881 Early satiety: Secondary | ICD-10-CM

## 2019-08-29 DIAGNOSIS — N210 Calculus in bladder: Secondary | ICD-10-CM | POA: Diagnosis present

## 2019-08-29 DIAGNOSIS — K254 Chronic or unspecified gastric ulcer with hemorrhage: Secondary | ICD-10-CM | POA: Diagnosis not present

## 2019-08-29 DIAGNOSIS — I255 Ischemic cardiomyopathy: Secondary | ICD-10-CM | POA: Diagnosis present

## 2019-08-29 DIAGNOSIS — N183 Chronic kidney disease, stage 3 unspecified: Secondary | ICD-10-CM | POA: Diagnosis present

## 2019-08-29 DIAGNOSIS — Z800 Family history of malignant neoplasm of digestive organs: Secondary | ICD-10-CM

## 2019-08-29 DIAGNOSIS — K121 Other forms of stomatitis: Secondary | ICD-10-CM | POA: Diagnosis present

## 2019-08-29 DIAGNOSIS — Z833 Family history of diabetes mellitus: Secondary | ICD-10-CM

## 2019-08-29 DIAGNOSIS — K922 Gastrointestinal hemorrhage, unspecified: Secondary | ICD-10-CM | POA: Diagnosis present

## 2019-08-29 DIAGNOSIS — E279 Disorder of adrenal gland, unspecified: Secondary | ICD-10-CM | POA: Diagnosis present

## 2019-08-29 DIAGNOSIS — I340 Nonrheumatic mitral (valve) insufficiency: Secondary | ICD-10-CM | POA: Diagnosis present

## 2019-08-29 DIAGNOSIS — Z79899 Other long term (current) drug therapy: Secondary | ICD-10-CM

## 2019-08-29 DIAGNOSIS — Z96641 Presence of right artificial hip joint: Secondary | ICD-10-CM | POA: Diagnosis present

## 2019-08-29 DIAGNOSIS — Z9089 Acquired absence of other organs: Secondary | ICD-10-CM

## 2019-08-29 DIAGNOSIS — K219 Gastro-esophageal reflux disease without esophagitis: Secondary | ICD-10-CM | POA: Diagnosis present

## 2019-08-29 DIAGNOSIS — K120 Recurrent oral aphthae: Secondary | ICD-10-CM | POA: Diagnosis present

## 2019-08-29 DIAGNOSIS — Z87442 Personal history of urinary calculi: Secondary | ICD-10-CM

## 2019-08-29 DIAGNOSIS — R1013 Epigastric pain: Secondary | ICD-10-CM | POA: Diagnosis not present

## 2019-08-29 DIAGNOSIS — K5730 Diverticulosis of large intestine without perforation or abscess without bleeding: Secondary | ICD-10-CM | POA: Diagnosis not present

## 2019-08-29 DIAGNOSIS — Z9049 Acquired absence of other specified parts of digestive tract: Secondary | ICD-10-CM

## 2019-08-29 DIAGNOSIS — Z7982 Long term (current) use of aspirin: Secondary | ICD-10-CM

## 2019-08-29 DIAGNOSIS — K648 Other hemorrhoids: Secondary | ICD-10-CM | POA: Diagnosis present

## 2019-08-29 DIAGNOSIS — Z20822 Contact with and (suspected) exposure to covid-19: Secondary | ICD-10-CM | POA: Diagnosis not present

## 2019-08-29 DIAGNOSIS — K2970 Gastritis, unspecified, without bleeding: Secondary | ICD-10-CM | POA: Diagnosis not present

## 2019-08-29 DIAGNOSIS — Z8049 Family history of malignant neoplasm of other genital organs: Secondary | ICD-10-CM

## 2019-08-29 DIAGNOSIS — I341 Nonrheumatic mitral (valve) prolapse: Secondary | ICD-10-CM | POA: Diagnosis present

## 2019-08-29 DIAGNOSIS — R109 Unspecified abdominal pain: Secondary | ICD-10-CM | POA: Diagnosis present

## 2019-08-29 DIAGNOSIS — Z955 Presence of coronary angioplasty implant and graft: Secondary | ICD-10-CM

## 2019-08-29 LAB — LACTIC ACID, PLASMA: Lactic Acid, Venous: 1.3 mmol/L (ref 0.5–1.9)

## 2019-08-29 LAB — CBC
HCT: 32.7 % — ABNORMAL LOW (ref 39.0–52.0)
Hemoglobin: 10.3 g/dL — ABNORMAL LOW (ref 13.0–17.0)
MCH: 28.6 pg (ref 26.0–34.0)
MCHC: 31.5 g/dL (ref 30.0–36.0)
MCV: 90.8 fL (ref 80.0–100.0)
Platelets: 287 10*3/uL (ref 150–400)
RBC: 3.6 MIL/uL — ABNORMAL LOW (ref 4.22–5.81)
RDW: 13.6 % (ref 11.5–15.5)
WBC: 16.4 10*3/uL — ABNORMAL HIGH (ref 4.0–10.5)
nRBC: 0 % (ref 0.0–0.2)

## 2019-08-29 LAB — URINALYSIS, ROUTINE W REFLEX MICROSCOPIC
Bacteria, UA: NONE SEEN
Bilirubin Urine: NEGATIVE
Glucose, UA: NEGATIVE mg/dL
Hgb urine dipstick: NEGATIVE
Ketones, ur: NEGATIVE mg/dL
Leukocytes,Ua: NEGATIVE
Nitrite: NEGATIVE
Protein, ur: 30 mg/dL — AB
Specific Gravity, Urine: 1.018 (ref 1.005–1.030)
pH: 5 (ref 5.0–8.0)

## 2019-08-29 LAB — COMPREHENSIVE METABOLIC PANEL
ALT: 33 U/L (ref 0–44)
AST: 20 U/L (ref 15–41)
Albumin: 3.5 g/dL (ref 3.5–5.0)
Alkaline Phosphatase: 62 U/L (ref 38–126)
Anion gap: 11 (ref 5–15)
BUN: 34 mg/dL — ABNORMAL HIGH (ref 8–23)
CO2: 22 mmol/L (ref 22–32)
Calcium: 8.8 mg/dL — ABNORMAL LOW (ref 8.9–10.3)
Chloride: 104 mmol/L (ref 98–111)
Creatinine, Ser: 1.45 mg/dL — ABNORMAL HIGH (ref 0.61–1.24)
GFR calc Af Amer: 53 mL/min — ABNORMAL LOW (ref >60)
GFR calc non Af Amer: 46 mL/min — ABNORMAL LOW (ref >60)
Glucose, Bld: 206 mg/dL — ABNORMAL HIGH (ref 70–99)
Potassium: 4.7 mmol/L (ref 3.5–5.1)
Sodium: 137 mmol/L (ref 135–145)
Total Bilirubin: 0.6 mg/dL (ref 0.3–1.2)
Total Protein: 7.5 g/dL (ref 6.5–8.1)

## 2019-08-29 LAB — LIPASE, BLOOD: Lipase: 29 U/L (ref 11–51)

## 2019-08-29 LAB — POC OCCULT BLOOD, ED: Fecal Occult Bld: NEGATIVE

## 2019-08-29 MED ORDER — ONDANSETRON HCL 4 MG/2ML IJ SOLN
4.0000 mg | Freq: Once | INTRAMUSCULAR | Status: AC
  Administered 2019-08-29: 4 mg via INTRAVENOUS
  Filled 2019-08-29: qty 2

## 2019-08-29 MED ORDER — SODIUM CHLORIDE 0.9% FLUSH
3.0000 mL | Freq: Once | INTRAVENOUS | Status: AC
  Administered 2019-08-29: 3 mL via INTRAVENOUS

## 2019-08-29 MED ORDER — IOHEXOL 300 MG/ML  SOLN
75.0000 mL | Freq: Once | INTRAMUSCULAR | Status: AC | PRN
  Administered 2019-08-30: 75 mL via INTRAVENOUS

## 2019-08-29 NOTE — ED Triage Notes (Signed)
Pt C/o abdominal pain X 3 weeks. Pt states he was seen by Dr. Quintin Alto at [facility]. Pt reports fever and chills "every night." Pt also reporting sores in his mouth.

## 2019-08-29 NOTE — ED Provider Notes (Signed)
[facility] EMERGENCY DEPARTMENT Provider Note   CSN: [id number] Arrival date & time: 08/29/19  1938     History Chief Complaint  Patient presents with  . Abdominal Pain    Kedan Vitatoe Bilbo is a 79 y.o. male.  Patient with a history of ischemic, myopathy, CAD with 1 stent, hypertension, GERD, CKD presenting with several week history of abdominal pain progressively worsening.  He describes a "tightness and distention" in his abdomen that is worse after eating.  It is mostly on the left side of the stomach as well as epigastrium.  He has had no fever nightly for the past 1 week by his report up to 102 or 103.  He is also having some sores in his mouth and throat for the past week as well.  He saw his PCP Dr. Quintin Alto at [facility].  He was given some Magic mouthwash and referred to GI.  He does not take any chemotherapy or radiation.  No history of cancer.  He states he is having dark stools as well but denies taking any blood thinners.  Stools have been loose and frequent on a daily basis but only once or twice a day.  He denies any vomiting.  No pain with urination or blood in the urine.  No chest pain or shortness of breath.  Does admit to frequent NSAID use. He had EGD and colonoscopy in 2019 at an outside hospital.  The history is provided by the patient.  Abdominal Pain Associated symptoms: chest pain, diarrhea, fatigue and nausea   Associated symptoms: no cough, no dysuria, no hematuria, no shortness of breath and no vomiting        Past Medical History:  Diagnosis Date  . Arthritis   . CKD (chronic kidney disease), stage III   . Coronary artery disease    a. unstable angina s/p cath 01/2017 -  specifically DES to a large branching ramus intermedius. Residual disease includes ostial occlusion of the LAD with right-to-left collaterals, also 90% posterolateral stenosis. EF 35-40% by cath but then 55-60% by echo.  . Essential hypertension   . GERD (gastroesophageal reflux  disease)   . Gout   . History of kidney stones   . Ischemic cardiomyopathy    a. EF 35-40% by cath then 55-60% by echo shortly after in 01/2017.  . Mitral valve disease    a. 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  . Pre-diabetes     Patient Active Problem List   Diagnosis Date Noted  . Status post total hip replacement, right 05/15/2018  . Osteoarthritis of right hip 02/08/2018  . Acute systolic heart failure ([place]) 01/07/2017  . Hyperlipidemia 01/07/2017  . Abnormal myocardial perfusion study   . Coronary artery disease involving native coronary artery of native heart with unstable angina pectoris [facility])     Past Surgical History:  Procedure Laterality Date  . CARDIAC CATHETERIZATION  01/06/2017  . cataract s Left 04/2017  . CHOLECYSTECTOMY    . CORONARY STENT INTERVENTION  01/06/2017   CORONARY STENT INTERVENTION  . CORONARY STENT INTERVENTION N/A 01/06/2017   Procedure: CORONARY STENT INTERVENTION;  Surgeon: Troy Sine, MD;  Location: [facility];  Service: Cardiovascular;  Laterality: N/A;  . LEFT HEART CATH AND CORONARY ANGIOGRAPHY N/A 01/06/2017   Procedure: LEFT HEART CATH AND CORONARY ANGIOGRAPHY;  Surgeon: Troy Sine, MD;  Location: [facility];  Service: Cardiovascular;  Laterality: N/A;  .  LITHOTRIPSY    . TONSILLECTOMY    . TOTAL HIP ARTHROPLASTY Right 05/15/2018   Procedure: TOTAL HIP ARTHROPLASTY ANTERIOR APPROACH;  Surgeon: Frederik Pear, MD;  Location: [facility];  Service: Orthopedics;  Laterality: Right;       Family History  Problem Relation Age of Onset  . Uterine cancer Mother   . Diabetes Mellitus II Sister   . Heart attack Brother        Age 16  . Pancreatic cancer Sister     Social History   Tobacco Use  . Smoking status: Never Smoker  . Smokeless tobacco: Never Used  Substance Use Topics  . Alcohol use: No  . Drug use: No    Home Medications Prior to Admission  medications   Medication Sig Start Date End Date Taking? Authorizing Provider  amLODipine (NORVASC) 5 MG tablet TAKE 1 TABLET BY MOUTH ONCE DAILY . APPOINTMENT REQUIRED FOR FUTURE REFILLS 04/02/19   Satira Sark, MD  Ascorbic Acid (VITAMIN C PO) Take 1,000 mg by mouth daily.     [provider]  aspirin EC 81 MG tablet Take 81 mg by mouth daily.    [provider]  b complex vitamins tablet Take 1 tablet by mouth daily.    [provider]  Cholecalciferol (VITAMIN D-3) 1000 units CAPS Take 1,000 Units by mouth daily.    [provider]  Coenzyme Q10 (COQ-10) 200 MG CAPS Take 200 mg by mouth daily.     [provider]  losartan-hydrochlorothiazide (HYZAAR) 50-12.5 MG tablet Take 0.5 tablets by mouth daily.    [provider]  Lysine 1000 MG TABS Take 1,000 mg by mouth daily.     [provider]  MAGNESIUM PO Take 250 mg by mouth daily.     [provider]  metoprolol succinate (TOPROL-XL) 25 MG 24 hr tablet TAKE 1 TABLET BY MOUTH ONCE DAILY 07/18/17   Satira Sark, MD  zinc gluconate 50 MG tablet Take 50 mg by mouth daily.    [provider]    Allergies    Patient has no known allergies.  Review of Systems   Review of Systems  Constitutional: Positive for appetite change and fatigue.  HENT: Negative for congestion and rhinorrhea.   Respiratory: Negative for cough and shortness of breath.   Cardiovascular: Positive for chest pain.  Gastrointestinal: Positive for abdominal pain, blood in stool, diarrhea and nausea. Negative for vomiting.  Genitourinary: Negative for dysuria and hematuria.  Musculoskeletal: Negative for myalgias.  Skin: Positive for wound.  Neurological: Negative for dizziness, weakness and headaches.    Physical Exam Updated Vital Signs BP 119/88   Pulse 98   Temp 99.2 F (37.3 C)   Resp 18   Ht 6\' 2"  (1.88 m)   Wt 103.4 kg   SpO2 100%   BMI 29.27 kg/m   Physical  Exam Vitals and nursing note reviewed.  Constitutional:      General: He is not in acute distress.    Appearance: He is well-developed.  HENT:     Head: Normocephalic and atraumatic.     Mouth/Throat:     Pharynx: No oropharyngeal exudate.     Comments: Aphthous ulcers involving tongue and soft palate. Eyes:     Conjunctiva/sclera: Conjunctivae normal.     Pupils: Pupils are equal, round, and reactive to light.  Neck:     Comments: No meningismus. Cardiovascular:     Rate and Rhythm: Normal rate and regular  rhythm.     Heart sounds: Normal heart sounds. No murmur.  Pulmonary:     Effort: Pulmonary effort is normal. No respiratory distress.     Breath sounds: Normal breath sounds.  Abdominal:     Palpations: Abdomen is soft.     Tenderness: There is abdominal tenderness. There is guarding. There is no rebound.     Comments: Diffuse tenderness worse in the epigastric and left lower quadrant with voluntary guarding  Genitourinary:    Comments: No testicular tenderness, no CVA tenderness  No gross blood on rectal exam Musculoskeletal:        General: No tenderness. Normal range of motion.     Cervical back: Normal range of motion and neck supple.  Skin:    General: Skin is warm.     Capillary Refill: Capillary refill takes less than 2 seconds.  Neurological:     General: No focal deficit present.     Mental Status: He is alert and oriented to person, place, and time. Mental status is at baseline.     Cranial Nerves: No cranial nerve deficit.     Motor: No abnormal muscle tone.     Coordination: Coordination normal.     Comments: No ataxia on finger to nose bilaterally. No pronator drift. 5/5 strength throughout. CN 2-12 intact.Equal grip strength. Sensation intact.   Psychiatric:        Behavior: Behavior normal.     ED Results / Procedures / Treatments   Labs (all labs ordered are listed, but only abnormal results are displayed) Labs Reviewed  COMPREHENSIVE METABOLIC  PANEL - Abnormal; Notable for the following components:      Result Value   Glucose, Bld 206 (*)    BUN 34 (*)    Creatinine, Ser 1.45 (*)    Calcium 8.8 (*)    GFR calc non Af Amer 46 (*)    GFR calc Af Amer 53 (*)    All other components within normal limits  CBC - Abnormal; Notable for the following components:   WBC 16.4 (*)    RBC 3.60 (*)    Hemoglobin 10.3 (*)    HCT 32.7 (*)    All other components within normal limits  URINALYSIS, ROUTINE W REFLEX MICROSCOPIC - Abnormal; Notable for the following components:   Protein, ur 30 (*)    All other components within normal limits  RESPIRATORY PANEL BY RT PCR (FLU A&B, COVID)  CULTURE, BLOOD (ROUTINE X 2)  CULTURE, BLOOD (ROUTINE X 2)  URINE CULTURE  LIPASE, BLOOD  LACTIC ACID, PLASMA  LACTIC ACID, PLASMA  POC OCCULT BLOOD, ED    EKG None  Radiology CT ABDOMEN PELVIS W CONTRAST  Result Date: 08/30/2019 CLINICAL DATA:  Abdominal pain x3 weeks. EXAM: CT ABDOMEN AND PELVIS WITH CONTRAST TECHNIQUE: Multidetector CT imaging of the abdomen and pelvis was performed using the standard protocol following bolus administration of intravenous contrast. CONTRAST:  31mL OMNIPAQUE IOHEXOL 300 MG/ML  SOLN COMPARISON:  July 16, 2012 FINDINGS: Lower chest: No acute abnormality. Hepatobiliary: No focal liver abnormality is seen. Status post cholecystectomy. No biliary dilatation. Pancreas: Unremarkable. No pancreatic ductal dilatation or surrounding inflammatory changes. Spleen: Normal in size without focal abnormality. Adrenals/Urinary Tract: Right adrenal gland is normal in appearance. A 1.3 cm isodense left adrenal mass is seen. The left kidney is atrophic in appearance. The right kidney is normal in size, without renal calculi or hydronephrosis. A 2.0 cm cyst is seen along the posterior aspect of  the mid right kidney. A 1.1 cm cyst is seen within the medial aspect of the mid left kidney. A 9 mm calcification is seen within the dependent portion  of the urinary bladder. Stomach/Bowel: Stomach is within normal limits. Appendix appears normal. No evidence of bowel dilatation. Noninflamed diverticula are seen throughout the large bowel. Vascular/Lymphatic: There is marked severity calcification of the abdominal aorta and bilateral common iliac arteries, without evidence of aneurysmal dilatation or dissection. No enlarged abdominal or pelvic lymph nodes. Reproductive: There is moderate to marked severity enlargement of the prostate gland. Other: No abdominal wall hernia or abnormality. No abdominopelvic ascites. Musculoskeletal: Multilevel degenerative changes seen throughout the lumbar spine. There is a total right hip replacement with associated streak artifact and subsequently limited evaluation of the adjacent osseous and soft tissue structures. IMPRESSION: 1. Colonic diverticulosis without evidence of diverticulitis. 2. Evidence of prior cholecystectomy. 3. Atrophic left kidney with small bilateral renal cysts. 4. Moderate to marked severity enlargement of the prostate gland. 5. 9 mm urinary bladder calculus. 6. Left adrenal mass which may represent adrenal adenoma. Aortic Atherosclerosis (ICD10-I70.0). Electronically Signed   By: Virgina Norfolk M.D.   On: 08/30/2019 00:48    Procedures Procedures (including critical care time)  Medications Ordered in ED Medications  iohexol (OMNIPAQUE) 300 MG/ML solution 75 mL (has no administration in time range)  sodium chloride flush (NS) 0.9 % injection 3 mL (3 mLs Intravenous Given 08/29/19 2255)  ondansetron (ZOFRAN) injection 4 mg (4 mg Intravenous Given 08/29/19 2346)    ED Course  I have reviewed the triage vital signs and the nursing notes.  Pertinent labs & imaging results that were available during my care of the patient were reviewed by me and considered in my medical decision making (see chart for details).    MDM Rules/Calculators/A&P                      Several weeks of diffuse  abdominal pain now with fever as well as oral ulcers.  Afebrile on arrival.  Abdomen soft but tender in the epigastrium and left lower quadrant.  Patient with recent colonoscopy and EGD in 2019 colonoscopy confirmed presence of diverticular disease. Mild gastritis was seen in the upper EGD consistent with chronic naproxen utilization.  Hemoglobin 10.3 which is slightly decreased from previous. Leukocytosis noted. No fever in the ED. Lactate normal.   CT scan obtained and is negative for acute pathology.  Does show diverticulosis, enlarged prostate and bladder stone  Urinalysis is negative for infection.  Patient afebrile in the ED.  Unclear etiology of his fever.  Covid test is pending.  CT scan as above without acute pathology.  Hemoglobin has slightly decreased but Hemoccult today is negative.  Recent EGD and colonoscopy as above.  CT discussed with Dr. Matilde Sprang of urology.  He agrees bladder stone should not be causing any pain.  No evidence of urinary tract infection will send culture.  Unclear etiology of patient's abdominal pain and fever. Dr. Matilde Sprang recommends sending urine culture and outpatient follow-up  Dose of Rocephin given while urine culture is pending. CT scan otherwise no acute pathology. Drop in hemoglobin with recent black stools but none today.  Unclear etiology of his fever abdominal pain. Consider possibly passed kidney stone. Vitals remained stable. With ongoing epigastric pain and drop in hemoglobin with recent black stools, may benefit from continued observation and GI evaluation in the morning. Patient agreeable.   Discussed with Dr. Darrick Meigs.  Ricke  JANMICHAEL ELLETT was evaluated in Emergency Department on 08/30/2019 for the symptoms described in the history of present illness. He was evaluated in the context of the global COVID-19 pandemic, which necessitated consideration that the patient might be at risk for infection with the SARS-CoV-2 virus that causes COVID-19.  Institutional protocols and algorithms that pertain to the evaluation of patients at risk for COVID-19 are in a state of rapid change based on information released by regulatory bodies including the CDC and federal and state organizations. These policies and algorithms were followed during the patient's care in the ED.  Final Clinical Impression(s) / ED Diagnoses Final diagnoses:  Epigastric pain  Fever, unspecified fever cause  Blood in stool    Rx / DC Orders ED Discharge Orders    None       Kanika Bungert, Annie Main, MD 08/30/19 [id number]

## 2019-08-30 ENCOUNTER — Emergency Department (HOSPITAL_COMMUNITY): Payer: Medicare Other

## 2019-08-30 ENCOUNTER — Encounter (HOSPITAL_COMMUNITY): Payer: Self-pay | Admitting: Family Medicine

## 2019-08-30 DIAGNOSIS — Z9089 Acquired absence of other organs: Secondary | ICD-10-CM | POA: Diagnosis not present

## 2019-08-30 DIAGNOSIS — I129 Hypertensive chronic kidney disease with stage 1 through stage 4 chronic kidney disease, or unspecified chronic kidney disease: Secondary | ICD-10-CM | POA: Diagnosis present

## 2019-08-30 DIAGNOSIS — K121 Other forms of stomatitis: Secondary | ICD-10-CM | POA: Diagnosis not present

## 2019-08-30 DIAGNOSIS — I340 Nonrheumatic mitral (valve) insufficiency: Secondary | ICD-10-CM | POA: Diagnosis present

## 2019-08-30 DIAGNOSIS — K3189 Other diseases of stomach and duodenum: Secondary | ICD-10-CM | POA: Diagnosis not present

## 2019-08-30 DIAGNOSIS — R509 Fever, unspecified: Secondary | ICD-10-CM

## 2019-08-30 DIAGNOSIS — K648 Other hemorrhoids: Secondary | ICD-10-CM | POA: Diagnosis present

## 2019-08-30 DIAGNOSIS — I2510 Atherosclerotic heart disease of native coronary artery without angina pectoris: Secondary | ICD-10-CM | POA: Diagnosis present

## 2019-08-30 DIAGNOSIS — K2980 Duodenitis without bleeding: Secondary | ICD-10-CM | POA: Diagnosis present

## 2019-08-30 DIAGNOSIS — K120 Recurrent oral aphthae: Secondary | ICD-10-CM | POA: Diagnosis present

## 2019-08-30 DIAGNOSIS — K5730 Diverticulosis of large intestine without perforation or abscess without bleeding: Secondary | ICD-10-CM | POA: Diagnosis not present

## 2019-08-30 DIAGNOSIS — D649 Anemia, unspecified: Secondary | ICD-10-CM | POA: Diagnosis present

## 2019-08-30 DIAGNOSIS — R1013 Epigastric pain: Secondary | ICD-10-CM

## 2019-08-30 DIAGNOSIS — R6881 Early satiety: Secondary | ICD-10-CM | POA: Diagnosis not present

## 2019-08-30 DIAGNOSIS — E279 Disorder of adrenal gland, unspecified: Secondary | ICD-10-CM | POA: Diagnosis present

## 2019-08-30 DIAGNOSIS — I341 Nonrheumatic mitral (valve) prolapse: Secondary | ICD-10-CM | POA: Diagnosis present

## 2019-08-30 DIAGNOSIS — Z833 Family history of diabetes mellitus: Secondary | ICD-10-CM | POA: Diagnosis not present

## 2019-08-30 DIAGNOSIS — I10 Essential (primary) hypertension: Secondary | ICD-10-CM | POA: Diagnosis not present

## 2019-08-30 DIAGNOSIS — Z96641 Presence of right artificial hip joint: Secondary | ICD-10-CM | POA: Diagnosis present

## 2019-08-30 DIAGNOSIS — K259 Gastric ulcer, unspecified as acute or chronic, without hemorrhage or perforation: Secondary | ICD-10-CM | POA: Diagnosis not present

## 2019-08-30 DIAGNOSIS — Z20822 Contact with and (suspected) exposure to covid-19: Secondary | ICD-10-CM | POA: Diagnosis present

## 2019-08-30 DIAGNOSIS — Z87442 Personal history of urinary calculi: Secondary | ICD-10-CM | POA: Diagnosis not present

## 2019-08-30 DIAGNOSIS — K2970 Gastritis, unspecified, without bleeding: Secondary | ICD-10-CM | POA: Diagnosis present

## 2019-08-30 DIAGNOSIS — K219 Gastro-esophageal reflux disease without esophagitis: Secondary | ICD-10-CM | POA: Diagnosis present

## 2019-08-30 DIAGNOSIS — Z955 Presence of coronary angioplasty implant and graft: Secondary | ICD-10-CM | POA: Diagnosis not present

## 2019-08-30 DIAGNOSIS — R109 Unspecified abdominal pain: Secondary | ICD-10-CM | POA: Diagnosis present

## 2019-08-30 DIAGNOSIS — K921 Melena: Secondary | ICD-10-CM | POA: Diagnosis not present

## 2019-08-30 DIAGNOSIS — K2950 Unspecified chronic gastritis without bleeding: Secondary | ICD-10-CM | POA: Diagnosis not present

## 2019-08-30 DIAGNOSIS — N210 Calculus in bladder: Secondary | ICD-10-CM | POA: Diagnosis not present

## 2019-08-30 DIAGNOSIS — N183 Chronic kidney disease, stage 3 unspecified: Secondary | ICD-10-CM | POA: Diagnosis present

## 2019-08-30 DIAGNOSIS — Z9049 Acquired absence of other specified parts of digestive tract: Secondary | ICD-10-CM | POA: Diagnosis not present

## 2019-08-30 DIAGNOSIS — K922 Gastrointestinal hemorrhage, unspecified: Secondary | ICD-10-CM | POA: Diagnosis present

## 2019-08-30 DIAGNOSIS — K254 Chronic or unspecified gastric ulcer with hemorrhage: Secondary | ICD-10-CM | POA: Diagnosis present

## 2019-08-30 DIAGNOSIS — I255 Ischemic cardiomyopathy: Secondary | ICD-10-CM | POA: Diagnosis present

## 2019-08-30 DIAGNOSIS — E7849 Other hyperlipidemia: Secondary | ICD-10-CM | POA: Diagnosis not present

## 2019-08-30 DIAGNOSIS — N400 Enlarged prostate without lower urinary tract symptoms: Secondary | ICD-10-CM | POA: Diagnosis present

## 2019-08-30 DIAGNOSIS — K5750 Diverticulosis of both small and large intestine without perforation or abscess without bleeding: Secondary | ICD-10-CM | POA: Diagnosis present

## 2019-08-30 LAB — LACTIC ACID, PLASMA: Lactic Acid, Venous: 0.9 mmol/L (ref 0.5–1.9)

## 2019-08-30 LAB — RESPIRATORY PANEL BY RT PCR (FLU A&B, COVID)
Influenza A by PCR: NEGATIVE
Influenza B by PCR: NEGATIVE
SARS Coronavirus 2 by RT PCR: NEGATIVE

## 2019-08-30 MED ORDER — ONDANSETRON HCL 4 MG/2ML IJ SOLN: 4.0000 mg | Freq: Four times a day (QID) | INTRAMUSCULAR | Status: DC | PRN

## 2019-08-30 MED ORDER — SODIUM CHLORIDE 0.9 % IV SOLN: INTRAVENOUS | Status: DC

## 2019-08-30 MED ORDER — ACETAMINOPHEN 325 MG PO TABS: 650.0000 mg | ORAL_TABLET | Freq: Four times a day (QID) | ORAL | Status: DC | PRN

## 2019-08-30 MED ORDER — ONDANSETRON HCL 4 MG PO TABS: 4.0000 mg | ORAL_TABLET | Freq: Four times a day (QID) | ORAL | Status: DC | PRN

## 2019-08-30 MED ORDER — PANTOPRAZOLE SODIUM 40 MG IV SOLR
40.0000 mg | INTRAVENOUS | Status: DC
  Administered 2019-08-30: 40 mg via INTRAVENOUS
  Filled 2019-08-30: qty 40

## 2019-08-30 MED ORDER — LIDOCAINE VISCOUS HCL 2 % MT SOLN
15.0000 mL | OROMUCOSAL | Status: DC | PRN
  Administered 2019-08-30: 15 mL via OROMUCOSAL
  Filled 2019-08-30: qty 15

## 2019-08-30 MED ORDER — ACETAMINOPHEN 650 MG RE SUPP: 650.0000 mg | Freq: Four times a day (QID) | RECTAL | Status: DC | PRN

## 2019-08-30 MED ORDER — SODIUM CHLORIDE 0.9 % IV SOLN
1.0000 g | INTRAVENOUS | Status: DC
  Administered 2019-08-31: 1 g via INTRAVENOUS
  Filled 2019-08-30: qty 10

## 2019-08-30 MED ORDER — SODIUM CHLORIDE 0.9 % IV SOLN
1.0000 g | Freq: Once | INTRAVENOUS | Status: AC
  Administered 2019-08-30: 1 g via INTRAVENOUS
  Filled 2019-08-30: qty 10

## 2019-08-30 NOTE — Consult Note (Signed)
Referring Provider: Kathie Dike, MD Primary Care Physician:  Rory Percy, MD Primary Gastroenterologist:  Previously unassigned, Garfield Cornea, MD   Reason for Consultation:  GI bleeding, abdominal discomfort, ?needs repeat EGD  HPI: Christopher Nelson is a 79 y.o. male with past medical history significant for hypertension, chronic kidney disease stage III, coronary artery disease status post stenting (September 2018) on aspirin (Plavix has been stopped), mitral valve disease, presented to the emergency department with several week history of oral ulcers, abdominal discomfort, early satiety and fevers.   Patient states first symptoms were oral ulcers, which is has had in the past. He tried [employer] he had at home as well as OTC lidocaine rinse. Ulcers have been painful, making it difficult to eat. Worse than he has had before. About 10 days ago, he noted nighttime fevers up to 103F, sweats that would soak his bedding. He would treat fever with Tylenol and ibuprofen. For a couple of weeks he has also noted abdominal fullness, bloating, early satiety with meals. Never had this symptoms before. Stools were black and tarry for 3 days (two weeks ago) and have been occurring 1-2 times daily, looser than normal. Denies Pepto, recent antibiotics, ill contacts. He has had fatigue, lightheadedness, but no shortness of breath. Denies hematuria, nosebleeds, dysuria. He saw Dr. Quintin Alto two weeks ago and stool was heme positive. He was in urgent care 08/03/19 in [facility], labs showed normal Hgb of 13.8 and WBC normal at that time.    In the emergency department yesterday, his hemoglobin was 10.3 (down from 13.8 on 08/03/19).  White blood cell count 16,400.  Creatinine 1.45, BUN 34, glucose 206.  Covid test negative. Influenza A/B negative. CT abdomen pelvis with contrast without any acute findings.  No evidence of diverticulitis, appendicitis.  He had a 9 mm urinary bladder calculus, moderate to  marked severe enlargement of the prostate, left adrenal mass which may represent adrenal adenoma.  I FOBT in the ER was negative.  Urinalysis unremarkable.  Lactic acid normal.  Chest x-ray today unremarkable.  Blood and urine cultures pending.  Labs on August 04, 2019 showed normal white blood cell count, hemoglobin of 13.8.   Previous GI work up: Patient had an EGD and colonoscopy February 2019 at [facility] for GI bleeding.  Colonoscopy showed pancolonic diverticulosis felt to be the source for GI bleeding.  Internal hemorrhoids.  EGD showed multiple small nonbleeding erosions in the gastric antrum and body.  Several scars seen in the gastric antrum consistent with prior gastric ulcers.  Gastric biopsies with mild reactive changes, negative for H. pylori.   Prior to Admission medications   Medication Sig Start Date End Date Taking? Authorizing Provider  amLODipine (NORVASC) 5 MG tablet TAKE 1 TABLET BY MOUTH ONCE DAILY . APPOINTMENT REQUIRED FOR FUTURE REFILLS 04/02/19  Yes Satira Sark, MD  losartan-hydrochlorothiazide [facility]) 50-12.5 MG tablet Take 0.5 tablets by mouth daily.   Yes [provider]  Ascorbic Acid (VITAMIN C PO) Take 1,000 mg by mouth daily.     [provider]  aspirin EC 81 MG tablet Take 81 mg by mouth daily.    [provider]  b complex vitamins tablet Take 1 tablet by mouth daily.    [provider]  Cholecalciferol (VITAMIN D-3) 1000 units CAPS Take 1,000 Units by mouth daily.    [provider]  Coenzyme Q10 (COQ-10) 200 MG CAPS Take 200 mg by mouth daily.  [provider]  Lysine 1000 MG TABS Take 1,000 mg by mouth daily.     [provider]  MAGNESIUM PO Take 250 mg by mouth daily.     [provider]  zinc gluconate 50 MG tablet Take 50 mg by mouth daily.    [provider]    Current Facility-Administered Medications  Medication Dose Route Frequency  Provider Last Rate Last Admin  . 0.9 %  sodium chloride infusion   Intravenous Continuous Kathie Dike, MD 75 mL/hr at 08/30/19 1112 New Bag at 08/30/19 1112  . acetaminophen (TYLENOL) tablet 650 mg  650 mg Oral Q6H PRN Kathie Dike, MD       Or  . acetaminophen (TYLENOL) suppository 650 mg  650 mg Rectal Q6H PRN Kathie Dike, MD      . Derrill Memo ON 08/31/2019] cefTRIAXone (ROCEPHIN) 1 g in sodium chloride 0.9 % 100 mL IVPB  1 g Intravenous Q24H Memon, Jehanzeb, MD      . lidocaine (XYLOCAINE) 2 % viscous mouth solution 15 mL  15 mL Mouth/Throat Q3H PRN Kathie Dike, MD   15 mL at 08/30/19 1114  . ondansetron (ZOFRAN) tablet 4 mg  4 mg Oral Q6H PRN Kathie Dike, MD       Or  . ondansetron (ZOFRAN) injection 4 mg  4 mg Intravenous Q6H PRN Kathie Dike, MD      . pantoprazole (PROTONIX) injection 40 mg  40 mg Intravenous Q24H Kathie Dike, MD   40 mg at 08/30/19 1112    Allergies as of 08/29/2019  . (No Known Allergies)    Past Medical History:  Diagnosis Date  . Arthritis   . CKD (chronic kidney disease), stage III   . Coronary artery disease    a. unstable angina s/p cath 01/2017 -  specifically DES to a large branching ramus intermedius. Residual disease includes ostial occlusion of the LAD with right-to-left collaterals, also 90% posterolateral stenosis. EF 35-40% by cath but then 55-60% by echo.  . Essential hypertension   . GERD (gastroesophageal reflux disease)   . Gout   . History of kidney stones   . Ischemic cardiomyopathy    a. EF 35-40% by cath then 55-60% by echo shortly after in 01/2017.  . Mitral valve disease    a. 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  . Pre-diabetes     Past Surgical History:  Procedure Laterality Date  . CARDIAC CATHETERIZATION  01/06/2017  . cataract s Left 04/2017  . CHOLECYSTECTOMY    . CORONARY STENT INTERVENTION  01/06/2017   CORONARY STENT INTERVENTION  . CORONARY  STENT INTERVENTION N/A 01/06/2017   Procedure: CORONARY STENT INTERVENTION;  Surgeon: Troy Sine, MD;  Location: [place] [facility];  Service: Cardiovascular;  Laterality: N/A;  . LEFT HEART CATH AND CORONARY ANGIOGRAPHY N/A 01/06/2017   Procedure: LEFT HEART CATH AND CORONARY ANGIOGRAPHY;  Surgeon: Troy Sine, MD;  Location: [place] [facility];  Service: Cardiovascular;  Laterality: N/A;  . LITHOTRIPSY    . TONSILLECTOMY    . TOTAL HIP ARTHROPLASTY Right 05/15/2018   Procedure: TOTAL HIP ARTHROPLASTY ANTERIOR APPROACH;  Surgeon: Frederik Pear, MD;  Location: [facility];  Service: Orthopedics;  Laterality: Right;    Family History  Problem Relation Age of Onset  . Uterine cancer Mother   . Diabetes Mellitus II Sister   . Heart attack Brother        Age 30  .  Pancreatic cancer Sister     Social History   Socioeconomic History  . Marital status: Married    Spouse name: Not on file  . Number of children: Not on file  . Years of education: Not on file  . Highest education level: Not on file  Occupational History  . Not on file  Tobacco Use  . Smoking status: Never Smoker  . Smokeless tobacco: Never Used  Substance and Sexual Activity  . Alcohol use: No  . Drug use: No  . Sexual activity: Yes  Other Topics Concern  . Not on file  Social History Narrative  . Not on file   Social Determinants of Health   Financial Resource Strain:   . Difficulty of Paying Living Expenses:   Food Insecurity:   . Worried About Charity fundraiser in the Last Year:   . Arboriculturist in the Last Year:   Transportation Needs:   . Film/video editor (Medical):   Marland Kitchen Lack of Transportation (Non-Medical):   Physical Activity:   . Days of Exercise per Week:   . Minutes of Exercise per Session:   Stress:   . Feeling of Stress :   Social Connections:   . Frequency of Communication with Friends and Family:   . Frequency of Social Gatherings with Friends and Family:   . Attends Religious  Services:   . Active Member of Clubs or Organizations:   . Attends Archivist Meetings:   Marland Kitchen Marital Status:   Intimate Partner Violence:   . Fear of Current or Ex-Partner:   . Emotionally Abused:   Marland Kitchen Physically Abused:   . Sexually Abused:      ROS:  General: Negative for anorexia, weight loss, fever, chills,+ fatigue, weakness. Eyes: Negative for vision changes.  ENT: Negative for hoarseness, difficulty swallowing , nasal congestion.see hpi CV: Negative for chest pain, angina, palpitations, dyspnea on exertion, peripheral edema.  Respiratory: Negative for dyspnea at rest, dyspnea on exertion, cough, sputum, wheezing.  GI: See history of present illness. GU:  Negative for dysuria, hematuria, urinary incontinence, urinary frequency, nocturnal urination.  MS: Negative for joint pain, low back pain.  Derm: Negative for rash or itching.  Neuro: Negative for weakness, abnormal sensation, seizure, frequent headaches, memory loss, confusion.  Psych: Negative for anxiety, depression, suicidal ideation, hallucinations.  Endo: Negative for unusual weight change.  Heme: Negative for bruising or bleeding. Allergy: Negative for rash or hives.       Physical Examination: Vital signs in last 24 hours: Temp:  [97.8 F (36.6 C)-99.2 F (37.3 C)] 97.8 F (36.6 C) (04/29 1037) Pulse Rate:  [43-98] 91 (04/29 1037) Resp:  [16-18] 18 (04/29 1037) BP: (113-153)/(40-94) 148/80 (04/29 1037) SpO2:  [94 %-100 %] 98 % (04/29 1037) Weight:  [103.4 kg] 103.4 kg (04/28 2009) Last BM Date: 08/29/19  General: Well-nourished, well-developed in no acute distress.  Head: Normocephalic, atraumatic.   Eyes: Conjunctiva pink, no icterus. Mouth: Oropharyngeal mucosa moist and pink , no lesions erythema or exudate. Neck: Supple without thyromegaly, masses, or lymphadenopathy.  Lungs: Clear to auscultation bilaterally.  Heart: Regular rate and rhythm, no murmurs rubs or gallops.  Abdomen: Bowel  sounds are normal, mild to moderate epigastric/ruq tenderness, nondistended, no hepatosplenomegaly or masses, no abdominal bruits or    hernia , no rebound or guarding.   Rectal: not performed Extremities: No lower extremity edema, clubbing, deformity.  Neuro: Alert and oriented x 4 , grossly normal neurologically.  Skin: Warm and dry, no rash or jaundice.   Psych: Alert and cooperative, normal mood and affect.        Intake/Output from previous day: 04/28 0701 - 04/29 0700 In: 3 [I.V.:3] Out: -  Intake/Output this shift: No intake/output data recorded.  Lab Results: CBC Recent Labs    08/29/19 2027  WBC 16.4*  HGB 10.3*  HCT 32.7*  MCV 90.8  PLT 287   BMET Recent Labs    08/29/19 2027  NA 137  K 4.7  CL 104  CO2 22  GLUCOSE 206*  BUN 34*  CREATININE 1.45*  CALCIUM 8.8*   LFT Recent Labs    08/29/19 2027  BILITOT 0.6  ALKPHOS 62  AST 20  ALT 33  PROT 7.5  ALBUMIN 3.5    Lipase Recent Labs    08/29/19 2027  LIPASE 29    PT/INR No results for input(s): LABPROT, INR in the last 72 hours.    Imaging Studies: CT ABDOMEN PELVIS W CONTRAST  Result Date: 08/30/2019 CLINICAL DATA:  Abdominal pain x3 weeks. EXAM: CT ABDOMEN AND PELVIS WITH CONTRAST TECHNIQUE: Multidetector CT imaging of the abdomen and pelvis was performed using the standard protocol following bolus administration of intravenous contrast. CONTRAST:  15mL OMNIPAQUE IOHEXOL 300 MG/ML  SOLN COMPARISON:  July 16, 2012 FINDINGS: Lower chest: No acute abnormality. Hepatobiliary: No focal liver abnormality is seen. Status post cholecystectomy. No biliary dilatation. Pancreas: Unremarkable. No pancreatic ductal dilatation or surrounding inflammatory changes. Spleen: Normal in size without focal abnormality. Adrenals/Urinary Tract: Right adrenal gland is normal in appearance. A 1.3 cm isodense left adrenal mass is seen. The left kidney is atrophic in appearance. The right kidney is normal in size,  without renal calculi or hydronephrosis. A 2.0 cm cyst is seen along the posterior aspect of the mid right kidney. A 1.1 cm cyst is seen within the medial aspect of the mid left kidney. A 9 mm calcification is seen within the dependent portion of the urinary bladder. Stomach/Bowel: Stomach is within normal limits. Appendix appears normal. No evidence of bowel dilatation. Noninflamed diverticula are seen throughout the large bowel. Vascular/Lymphatic: There is marked severity calcification of the abdominal aorta and bilateral common iliac arteries, without evidence of aneurysmal dilatation or dissection. No enlarged abdominal or pelvic lymph nodes. Reproductive: There is moderate to marked severity enlargement of the prostate gland. Other: No abdominal wall hernia or abnormality. No abdominopelvic ascites. Musculoskeletal: Multilevel degenerative changes seen throughout the lumbar spine. There is a total right hip replacement with associated streak artifact and subsequently limited evaluation of the adjacent osseous and soft tissue structures. IMPRESSION: 1. Colonic diverticulosis without evidence of diverticulitis. 2. Evidence of prior cholecystectomy. 3. Atrophic left kidney with small bilateral renal cysts. 4. Moderate to marked severity enlargement of the prostate gland. 5. 9 mm urinary bladder calculus. 6. Left adrenal mass which may represent adrenal adenoma. Aortic Atherosclerosis (ICD10-I70.0). Electronically Signed   By: Virgina Norfolk M.D.   On: 08/30/2019 00:48   DG Chest Portable 1 View  Result Date: 08/30/2019 CLINICAL DATA:  Fever. EXAM: PORTABLE CHEST 1 VIEW COMPARISON:  None. FINDINGS: A trace amount of linear atelectasis is seen within the lateral aspect of the left lung base. There is no evidence of acute infiltrate, pleural effusion or pneumothorax. The heart size and mediastinal contours are within normal limits. The visualized skeletal structures are unremarkable. IMPRESSION: No active  disease. Electronically Signed   By: Virgina Norfolk M.D.   On: 08/30/2019  02:02  Minnie.Brome week]   Impression: 79 y/o male with prior h/o erosive gastritis, evidence of prior gastric ulcers (scarring noted on EGD in 06/2017) currently on daily baby ASA and recent ibuprofen use (for fever management) presenting with 3 week history of oral ulcers, fever, postprandial bloating/early satiety, loose black stool as outlined above.  Illness first started with significant oral ulcers followed by postprandial bloating/discomfort/early satiety which has been significant over the last 10 days. Notes nighttime fever up to 103F associated with night sweats. Last night was first night without fever. Initially had 3 day period of black tarry stools but this has resolved. This has been associated with well documented drop in Hgb from 13.8 to 10.3 in the past 3 weeks.   Suspect he has had recent UGI bleeding possibly due to ulcer disease, although this would not explain febrile illness or leukocytosis. Would be concerned about underlying infection or malignancy.     Plan: 1. Recommend EGD. To discuss timing with Dr. Gala Romney. 2. Monitor H/H, transfuse as needed. 3. IV PPI. 4. Monitor for fever, follow up pending cultures.   We would like to thank you for the opportunity to participate in the care of Cevin W Coone.  Laureen Ochs. Bernarda Caffey [facility] [phone] 4/29/20211:27 PM     LOS: 0 days

## 2019-08-30 NOTE — ED Notes (Signed)
Repaged Dr Drue Novel to Dr Rancour @ [phone] @ [phone]

## 2019-08-30 NOTE — H&P (Signed)
History and Physical    Christopher Nelson [id number] DOB: 1940/11/27 DOA: 08/29/2019  PCP: Rory Percy, MD  Patient coming from: Home  I have personally briefly reviewed patient's old medical records in [place]  Chief Complaint: Abdominal discomfort  HPI: Christopher Nelson is a 79 y.o. male with medical history significant of hypertension, chronic kidney disease stage III, GERD, presents with abdominal discomfort which is been present for the past 2 to 3 weeks.  He describes early satiety, increased flatulence, epigastric and left lower quadrant discomfort.  He feels his abdomen feels very tight after he eats a small amount of food.  He has been having loose stool, approximately 1-2 a day.  He does describe describes dark stools.  He has not had any vomiting.  He had seen his primary care physician who ordered FOBT and he was apparently positive.  He also describes having nightly fevers for the past week.  Denies any recent insect bites.  No cough or shortness of breath.  No dysuria.  He has had ulcers develop in his mouth for the past week.  He saw his primary care physician and was prescribed Dukes mouthwash.  He feels that this is not helped.  Reports that oral ulcers are painful.  He has been taking ibuprofen daily for the past 7 to 10 days for fevers.  Last EGD and colonoscopy was in 06/2017 at [facility] at which time it showed mild gastritis and diverticulosis.  ED Course: He is noted to be afebrile in the emergency room.  Vitals are otherwise stable.  Hemoglobin is 10.3 with a baseline of 12-14.  He does appear to be hemoconcentrated.  WBC count 16.4.  Urinalysis did not show any signs of infection.  Lactic acid is normal.  Covid test is negative.  CT abdomen pelvis did not show any acute findings.  FOBT performed in the ER is negative.  Review of Systems: As per HPI otherwise 10 point review of systems negative.    Past Medical History:  Diagnosis Date  . Arthritis   .  CKD (chronic kidney disease), stage III   . Coronary artery disease    a. unstable angina s/p cath 01/2017 -  specifically DES to a large branching ramus intermedius. Residual disease includes ostial occlusion of the LAD with right-to-left collaterals, also 90% posterolateral stenosis. EF 35-40% by cath but then 55-60% by echo.  . Essential hypertension   . GERD (gastroesophageal reflux disease)   . Gout   . History of kidney stones   . Ischemic cardiomyopathy    a. EF 35-40% by cath then 55-60% by echo shortly after in 01/2017.  . Mitral valve disease    a. 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  . Pre-diabetes     Past Surgical History:  Procedure Laterality Date  . CARDIAC CATHETERIZATION  01/06/2017  . cataract s Left 04/2017  . CHOLECYSTECTOMY    . CORONARY STENT INTERVENTION  01/06/2017   CORONARY STENT INTERVENTION  . CORONARY STENT INTERVENTION N/A 01/06/2017   Procedure: CORONARY STENT INTERVENTION;  Surgeon: Troy Sine, MD;  Location: [place] [facility];  Service: Cardiovascular;  Laterality: N/A;  . LEFT HEART CATH AND CORONARY ANGIOGRAPHY N/A 01/06/2017   Procedure: LEFT HEART CATH AND CORONARY ANGIOGRAPHY;  Surgeon: Troy Sine, MD;  Location: [place] [facility];  Service: Cardiovascular;  Laterality: N/A;  . LITHOTRIPSY    . TONSILLECTOMY    .  TOTAL HIP ARTHROPLASTY Right 05/15/2018   Procedure: TOTAL HIP ARTHROPLASTY ANTERIOR APPROACH;  Surgeon: Frederik Pear, MD;  Location: [facility];  Service: Orthopedics;  Laterality: Right;    Social History:  reports that he has never smoked. He has never used smokeless tobacco. He reports that he does not drink alcohol or use drugs.  No Known Allergies  Family History  Problem Relation Age of Onset  . Uterine cancer Mother   . Diabetes Mellitus II Sister   . Heart attack Brother        Age 6  . Pancreatic cancer Sister      Prior to Admission medications     Medication Sig Start Date End Date Taking? Authorizing Provider  amLODipine (NORVASC) 5 MG tablet TAKE 1 TABLET BY MOUTH ONCE DAILY . APPOINTMENT REQUIRED FOR FUTURE REFILLS 04/02/19  Yes Satira Sark, MD  losartan-hydrochlorothiazide [facility]) 50-12.5 MG tablet Take 0.5 tablets by mouth daily.   Yes [provider]  Ascorbic Acid (VITAMIN C PO) Take 1,000 mg by mouth daily.     [provider]  aspirin EC 81 MG tablet Take 81 mg by mouth daily.    [provider]  b complex vitamins tablet Take 1 tablet by mouth daily.    [provider]  Cholecalciferol (VITAMIN D-3) 1000 units CAPS Take 1,000 Units by mouth daily.    [provider]  Coenzyme Q10 (COQ-10) 200 MG CAPS Take 200 mg by mouth daily.     [provider]  Lysine 1000 MG TABS Take 1,000 mg by mouth daily.     [provider]  MAGNESIUM PO Take 250 mg by mouth daily.     [provider]  zinc gluconate 50 MG tablet Take 50 mg by mouth daily.    [provider]    Physical Exam: Vitals:   08/30/19 0800 08/30/19 0830 08/30/19 0900 08/30/19 1037  BP: 134/84 (!) 148/77 (!) 148/80 (!) 148/80  Pulse: 77 88 77 91  Resp:  18 18 18   Temp:    97.8 F (36.6 C)  TempSrc:      SpO2: 94% 96% 97% 98%  Weight:      Height:        Constitutional: NAD, calm, comfortable Eyes: PERRL, lids and conjunctivae normal ENMT: Mucous membranes are moist.  Ulcerations over soft/hard palate posterior pharynx clear of any exudate or lesions.Normal dentition.  Neck: normal, supple, no masses, no thyromegaly Respiratory: clear to auscultation bilaterally, no wheezing, no crackles. Normal respiratory effort. No accessory muscle use.  Cardiovascular: Regular rate and rhythm, no murmurs / rubs / gallops. No extremity edema. 2+ pedal pulses. No carotid bruits.  Abdomen: Tender in epigastrium and left lower quadrant, no masses palpated. No hepatosplenomegaly. Bowel sounds  positive.  Musculoskeletal: no clubbing / cyanosis. No joint deformity upper and lower extremities. Good ROM, no contractures. Normal muscle tone.  Skin: no rashes, lesions, ulcers. No induration Neurologic: CN 2-12 grossly intact. Sensation intact, DTR normal. Strength 5/5 in all 4.  Psychiatric: Normal judgment and insight. Alert and oriented x 3. Normal mood.    Labs on Admission: I have personally reviewed following labs and imaging studies  CBC: Recent Labs  Lab 08/29/19 2027  WBC 16.4*  HGB 10.3*  HCT 32.7*  MCV 90.8  PLT [id number]   Basic Metabolic Panel: Recent Labs  Lab 08/29/19 2027  NA 137  K 4.7  CL 104  CO2 22  GLUCOSE 206*  BUN 34*  CREATININE 1.45*  CALCIUM 8.8*   GFR: Estimated Creatinine Clearance: 53.9 mL/min (A) (by C-G formula based on SCr of 1.45 mg/dL (H)). Liver Function Tests: Recent Labs  Lab 08/29/19 2027  AST 20  ALT 33  ALKPHOS 62  BILITOT 0.6  PROT 7.5  ALBUMIN 3.5   Recent Labs  Lab 08/29/19 2027  LIPASE 29   No results for input(s): AMMONIA in the last 168 hours. Coagulation Profile: No results for input(s): INR, PROTIME in the last 168 hours. Cardiac Enzymes: No results for input(s): CKTOTAL, CKMB, CKMBINDEX, TROPONINI in the last 168 hours. BNP (last 3 results) No results for input(s): PROBNP in the last 8760 hours. HbA1C: No results for input(s): HGBA1C in the last 72 hours. CBG: No results for input(s): GLUCAP in the last 168 hours. Lipid Profile: No results for input(s): CHOL, HDL, LDLCALC, TRIG, CHOLHDL, LDLDIRECT in the last 72 hours. Thyroid Function Tests: No results for input(s): TSH, T4TOTAL, FREET4, T3FREE, THYROIDAB in the last 72 hours. Anemia Panel: No results for input(s): VITAMINB12, FOLATE, FERRITIN, TIBC, IRON, RETICCTPCT in the last 72 hours. Urine analysis:    Component Value Date/Time   COLORURINE YELLOW 08/29/2019 2258   APPEARANCEUR CLEAR 08/29/2019 2258   LABSPEC 1.018 08/29/2019 2258   PHURINE  5.0 08/29/2019 2258   GLUCOSEU NEGATIVE 08/29/2019 2258   HGBUR NEGATIVE 08/29/2019 2258   [place] 08/29/2019 2258   [place] 08/29/2019 2258   PROTEINUR 30 (A) 08/29/2019 2258   NITRITE NEGATIVE 08/29/2019 2258   LEUKOCYTESUR NEGATIVE 08/29/2019 2258    Radiological Exams on Admission: CT ABDOMEN PELVIS W CONTRAST  Result Date: 08/30/2019 CLINICAL DATA:  Abdominal pain x3 weeks. EXAM: CT ABDOMEN AND PELVIS WITH CONTRAST TECHNIQUE: Multidetector CT imaging of the abdomen and pelvis was performed using the standard protocol following bolus administration of intravenous contrast. CONTRAST:  29mL OMNIPAQUE IOHEXOL 300 MG/ML  SOLN COMPARISON:  July 16, 2012 FINDINGS: Lower chest: No acute abnormality. Hepatobiliary: No focal liver abnormality is seen. Status post cholecystectomy. No biliary dilatation. Pancreas: Unremarkable. No pancreatic ductal dilatation or surrounding inflammatory changes. Spleen: Normal in size without focal abnormality. Adrenals/Urinary Tract: Right adrenal gland is normal in appearance. A 1.3 cm isodense left adrenal mass is seen. The left kidney is atrophic in appearance. The right kidney is normal in size, without renal calculi or hydronephrosis. A 2.0 cm cyst is seen along the posterior aspect of the mid right kidney. A 1.1 cm cyst is seen within the medial aspect of the mid left kidney. A 9 mm calcification is seen within the dependent portion of the urinary bladder. Stomach/Bowel: Stomach is within normal limits. Appendix appears normal. No evidence of bowel dilatation. Noninflamed diverticula are seen throughout the large bowel. Vascular/Lymphatic: There is marked severity calcification of the abdominal aorta and bilateral common iliac arteries, without evidence of aneurysmal dilatation or dissection. No enlarged abdominal or pelvic lymph nodes. Reproductive: There is moderate to marked severity enlargement of the prostate gland. Other: No abdominal wall  hernia or abnormality. No abdominopelvic ascites. Musculoskeletal: Multilevel degenerative changes seen throughout the lumbar spine. There is a total right hip replacement with associated streak artifact and subsequently limited evaluation of the adjacent osseous and soft tissue structures. IMPRESSION: 1. Colonic diverticulosis without evidence of diverticulitis. 2. Evidence of prior cholecystectomy. 3. Atrophic left kidney with small bilateral renal cysts. 4. Moderate to marked severity enlargement of the prostate gland. 5. 9 mm urinary bladder calculus. 6. Left adrenal mass which may represent adrenal adenoma.  Aortic Atherosclerosis (ICD10-I70.0). Electronically Signed   By: Virgina Norfolk M.D.   On: 08/30/2019 00:48   DG Chest Portable 1 View  Result Date: 08/30/2019 CLINICAL DATA:  Fever. EXAM: PORTABLE CHEST 1 VIEW COMPARISON:  None. FINDINGS: A trace amount of linear atelectasis is seen within the lateral aspect of the left lung base. There is no evidence of acute infiltrate, pleural effusion or pneumothorax. The heart size and mediastinal contours are within normal limits. The visualized skeletal structures are unremarkable. IMPRESSION: No active disease. Electronically Signed   By: Virgina Norfolk M.D.   On: 08/30/2019 02:02    Assessment/Plan Active Problems:   Abdominal pain   Stomatitis   GERD (gastroesophageal reflux disease)   Anemia   GI bleeding   Fever     1. Abdominal pain with concerns for GI bleeding.  He does describe dark stools.  He has been taking NSAIDs.  Hemoglobin has had a mild decline since 05/2019.  Will consult gastroenterology to see if repeat EGD is indicated.  Continue on PPI.  Did not have any acute findings on CT imaging. 2. Anemia.  Likely related to recent blood loss.  Continue to follow.  Anticipate that his hemoglobin should decline further with hydration. 3. Leukocytosis/fever.  Unclear etiology.  Does not have any clear source of infection at this  time.  He was started on Rocephin.  Urine culture and blood cultures have been sent.  Would have low threshold for discontinuing the next 24 hours if cultures remain negative and no further fevers. 4. Stomatitis.  Unclear etiology.  Continue on lidocaine mouthwash for symptomatic relief.  Does not have any evidence of thrush at this time. 5. GERD.  Continue on PPI  DVT prophylaxis: SCDs  Code Status: Full code Family Communication: Discussed with patient Disposition Plan: Discharge home once GI symptoms have been addressed and no signs of infection Consults called: Gastroenterology Admission status: Inpatient, MedSurg.  Anticipate discharge in the next 24 hours  Kathie Dike MD [facility]   If 7PM-7AM, please contact night-coverage [email or web address]   08/30/2019, 11:01 AM

## 2019-08-31 ENCOUNTER — Encounter (HOSPITAL_COMMUNITY)
Admission: EM | Disposition: A | Discharge status: Home / Self Care | Payer: Self-pay | Source: Home / Self Care | Attending: Internal Medicine

## 2019-08-31 ENCOUNTER — Encounter (HOSPITAL_COMMUNITY): Payer: Self-pay | Admitting: Family Medicine

## 2019-08-31 DIAGNOSIS — E7849 Other hyperlipidemia: Secondary | ICD-10-CM | POA: Diagnosis not present

## 2019-08-31 DIAGNOSIS — I10 Essential (primary) hypertension: Secondary | ICD-10-CM | POA: Diagnosis not present

## 2019-08-31 DIAGNOSIS — K259 Gastric ulcer, unspecified as acute or chronic, without hemorrhage or perforation: Secondary | ICD-10-CM

## 2019-08-31 DIAGNOSIS — K2970 Gastritis, unspecified, without bleeding: Secondary | ICD-10-CM

## 2019-08-31 DIAGNOSIS — K921 Melena: Secondary | ICD-10-CM

## 2019-08-31 HISTORY — PX: BIOPSY: SHX5522

## 2019-08-31 HISTORY — PX: ESOPHAGOGASTRODUODENOSCOPY: SHX5428

## 2019-08-31 LAB — BASIC METABOLIC PANEL
Anion gap: 8 (ref 5–15)
BUN: 22 mg/dL (ref 8–23)
CO2: 25 mmol/L (ref 22–32)
Calcium: 8.2 mg/dL — ABNORMAL LOW (ref 8.9–10.3)
Chloride: 103 mmol/L (ref 98–111)
Creatinine, Ser: 1.29 mg/dL — ABNORMAL HIGH (ref 0.61–1.24)
GFR calc Af Amer: >60 mL/min (ref >60)
GFR calc non Af Amer: 53 mL/min — ABNORMAL LOW (ref >60)
Glucose, Bld: 123 mg/dL — ABNORMAL HIGH (ref 70–99)
Potassium: 4.3 mmol/L (ref 3.5–5.1)
Sodium: 136 mmol/L (ref 135–145)

## 2019-08-31 LAB — URINE CULTURE: Culture: <10000 — AB

## 2019-08-31 LAB — CBC
HCT: 28.8 % — ABNORMAL LOW (ref 39.0–52.0)
Hemoglobin: 9.3 g/dL — ABNORMAL LOW (ref 13.0–17.0)
MCH: 29.3 pg (ref 26.0–34.0)
MCHC: 32.3 g/dL (ref 30.0–36.0)
MCV: 90.9 fL (ref 80.0–100.0)
Platelets: 246 10*3/uL (ref 150–400)
RBC: 3.17 MIL/uL — ABNORMAL LOW (ref 4.22–5.81)
RDW: 13.3 % (ref 11.5–15.5)
WBC: 9.3 10*3/uL (ref 4.0–10.5)
nRBC: 0 % (ref 0.0–0.2)

## 2019-08-31 SURGERY — EGD (ESOPHAGOGASTRODUODENOSCOPY)
Anesthesia: Moderate Sedation

## 2019-08-31 MED ORDER — LIDOCAINE VISCOUS HCL 2 % MT SOLN
OROMUCOSAL | Status: AC
  Filled 2019-08-31: qty 15

## 2019-08-31 MED ORDER — BOOST / RESOURCE BREEZE PO LIQD CUSTOM: 1.0000 | Freq: Three times a day (TID) | ORAL | Status: DC

## 2019-08-31 MED ORDER — LIDOCAINE VISCOUS HCL 2 % MT SOLN
OROMUCOSAL | Status: DC | PRN
  Administered 2019-08-31: 4 mL via OROMUCOSAL

## 2019-08-31 MED ORDER — SODIUM CHLORIDE 0.9 % IV SOLN: INTRAVENOUS | Status: DC

## 2019-08-31 MED ORDER — MIDAZOLAM HCL 5 MG/5ML IJ SOLN
INTRAMUSCULAR | Status: AC
  Filled 2019-08-31: qty 10

## 2019-08-31 MED ORDER — STERILE WATER FOR IRRIGATION IR SOLN
Status: DC | PRN
  Administered 2019-08-31: 1.5 mL

## 2019-08-31 MED ORDER — MIDAZOLAM HCL 5 MG/5ML IJ SOLN
INTRAMUSCULAR | Status: DC | PRN
  Administered 2019-08-31 (×2): 2 mg via INTRAVENOUS

## 2019-08-31 MED ORDER — PANTOPRAZOLE SODIUM 40 MG PO TBEC
40.0000 mg | DELAYED_RELEASE_TABLET | Freq: Two times a day (BID) | ORAL | 1 refills | Status: DC
Start: 2019-08-31 — End: 2019-11-08

## 2019-08-31 MED ORDER — PANTOPRAZOLE SODIUM 40 MG IV SOLR
40.0000 mg | Freq: Two times a day (BID) | INTRAVENOUS | Status: DC
  Filled 2019-08-31: qty 40

## 2019-08-31 MED ORDER — HYDRALAZINE HCL 20 MG/ML IJ SOLN
INTRAMUSCULAR | Status: AC
  Filled 2019-08-31: qty 1

## 2019-08-31 MED ORDER — MEPERIDINE HCL 100 MG/ML IJ SOLN
INTRAMUSCULAR | Status: DC | PRN
  Administered 2019-08-31 (×2): 25 mg via INTRAVENOUS

## 2019-08-31 MED ORDER — MEPERIDINE HCL 100 MG/ML IJ SOLN
INTRAMUSCULAR | Status: AC
  Filled 2019-08-31: qty 2

## 2019-08-31 NOTE — Discharge Summary (Signed)
Physician Discharge Summary  Christopher Nelson [id number] DOB: [birth year] DOA: 08/29/2019  PCP: Rory Percy, MD  Admit date: 08/29/2019 Discharge date: 08/31/2019  Admitted From: Home Disposition: Home  Recommendations for Outpatient Follow-up:  1. Follow up with PCP in 1-2 weeks 2. Please obtain BMP/CBC in one week 3. Follow-up with gastroenterology in 4 months 4. Consider outpatient referral to rheumatology for further evaluation of oral ulcers and night sweats.  Discharge Condition: Stable CODE STATUS: Full code Diet recommendation: Soft diet  Brief/Interim Summary: 79 year old male with history of hypertension, chronic kidney disease stage III, GERD, admitted to the hospital with abdominal discomfort.  He describes epigastric discomfort.  He also had loose stools which she described as dark in color.  He was seen by his primary care physician and was found to have positive FOBT.  He admitted to taking ibuprofen recently.  He was admitted to the hospital and seen by gastroenterology.  He underwent EGD that showed moderate gastritis.  It was felt melena was due to gastric ulcer with no stigmata of bleeding.  It was recommended that patient continue on Protonix twice daily and refrain from any further NSAID use.  He did not have any fevers in the hospital.  No signs of infection.  Cultures were negative.  He does describe having night sweats as well as intermittent oral ulcers.  No clear cause of the symptoms although would likely benefit from further outpatient work-up.  Consider referral to rheumatology.  Patient is otherwise stable for discharge at this time.  Discharge Diagnoses:  Active Problems:   Abdominal pain   Stomatitis   GERD (gastroesophageal reflux disease)   Anemia   GI bleeding   Fever   Early satiety   Melena    Discharge Instructions  Discharge Instructions    Diet - low sodium heart healthy   Complete by: As directed    Increase activity slowly    Complete by: As directed      Allergies as of 08/31/2019   No Known Allergies     Medication List    STOP taking these medications   losartan-hydrochlorothiazide 50-12.5 MG tablet Commonly known as: HYZAAR     TAKE these medications   amLODipine 5 MG tablet Commonly known as: NORVASC TAKE 1 TABLET BY MOUTH ONCE DAILY . APPOINTMENT REQUIRED FOR FUTURE REFILLS   aspirin EC 81 MG tablet Take 81 mg by mouth daily.   b complex vitamins tablet Take 1 tablet by mouth daily.   CoQ-10 200 MG Caps Take 200 mg by mouth daily.   Lysine 1000 MG Tabs Take 1,000 mg by mouth daily.   MAGNESIUM PO Take 250 mg by mouth daily.   pantoprazole 40 MG tablet Commonly known as: Protonix Take 1 tablet (40 mg total) by mouth 2 (two) times daily.   VITAMIN C PO Take 1,000 mg by mouth daily.   Vitamin D-3 25 MCG (1000 UT) Caps Take 1,000 Units by mouth daily.   zinc gluconate 50 MG tablet Take 50 mg by mouth daily.      Follow-up Information    Fields, Marga Melnick, MD. Schedule an appointment as soon as possible for a visit in 4 month(s).   Specialty: Gastroenterology Contact information: [address] [place] [address] [phone]          No Known Allergies  Consultations:  Gastroenterology   Procedures/Studies: CT ABDOMEN PELVIS W CONTRAST  Result Date: 08/30/2019 CLINICAL DATA:  Abdominal pain x3 weeks. EXAM: CT ABDOMEN AND  PELVIS WITH CONTRAST TECHNIQUE: Multidetector CT imaging of the abdomen and pelvis was performed using the standard protocol following bolus administration of intravenous contrast. CONTRAST:  33mL OMNIPAQUE IOHEXOL 300 MG/ML  SOLN COMPARISON:  July 16, 2012 FINDINGS: Lower chest: No acute abnormality. Hepatobiliary: No focal liver abnormality is seen. Status post cholecystectomy. No biliary dilatation. Pancreas: Unremarkable. No pancreatic ductal dilatation or surrounding inflammatory changes. Spleen: Normal in size without focal abnormality.  Adrenals/Urinary Tract: Right adrenal gland is normal in appearance. A 1.3 cm isodense left adrenal mass is seen. The left kidney is atrophic in appearance. The right kidney is normal in size, without renal calculi or hydronephrosis. A 2.0 cm cyst is seen along the posterior aspect of the mid right kidney. A 1.1 cm cyst is seen within the medial aspect of the mid left kidney. A 9 mm calcification is seen within the dependent portion of the urinary bladder. Stomach/Bowel: Stomach is within normal limits. Appendix appears normal. No evidence of bowel dilatation. Noninflamed diverticula are seen throughout the large bowel. Vascular/Lymphatic: There is marked severity calcification of the abdominal aorta and bilateral common iliac arteries, without evidence of aneurysmal dilatation or dissection. No enlarged abdominal or pelvic lymph nodes. Reproductive: There is moderate to marked severity enlargement of the prostate gland. Other: No abdominal wall hernia or abnormality. No abdominopelvic ascites. Musculoskeletal: Multilevel degenerative changes seen throughout the lumbar spine. There is a total right hip replacement with associated streak artifact and subsequently limited evaluation of the adjacent osseous and soft tissue structures. IMPRESSION: 1. Colonic diverticulosis without evidence of diverticulitis. 2. Evidence of prior cholecystectomy. 3. Atrophic left kidney with small bilateral renal cysts. 4. Moderate to marked severity enlargement of the prostate gland. 5. 9 mm urinary bladder calculus. 6. Left adrenal mass which may represent adrenal adenoma. Aortic Atherosclerosis (ICD10-I70.0). Electronically Signed   By: Virgina Norfolk M.D.   On: 08/30/2019 00:48   DG Chest Portable 1 View  Result Date: 08/30/2019 CLINICAL DATA:  Fever. EXAM: PORTABLE CHEST 1 VIEW COMPARISON:  None. FINDINGS: A trace amount of linear atelectasis is seen within the lateral aspect of the left lung base. There is no evidence of  acute infiltrate, pleural effusion or pneumothorax. The heart size and mediastinal contours are within normal limits. The visualized skeletal structures are unremarkable. IMPRESSION: No active disease. Electronically Signed   By: Virgina Norfolk M.D.   On: 08/30/2019 02:02       Subjective: No further abdominal pain.  Tolerating diet.  Discharge Exam: Vitals:   08/31/19 1150 08/31/19 1155 08/31/19 1200 08/31/19 1457  BP: 132/77 125/79 136/78 132/78  Pulse: 88 88 86 87  Resp: 18 19 17 18   Temp:      TempSrc:      SpO2: 98% 98% 97% 100%  Weight:      Height:        General: Pt is alert, awake, not in acute distress Cardiovascular: RRR, S1/S2 +, no rubs, no gallops Respiratory: CTA bilaterally, no wheezing, no rhonchi Abdominal: Soft, NT, ND, bowel sounds + Extremities: no edema, no cyanosis    The results of significant diagnostics from this hospitalization (including imaging, microbiology, ancillary and laboratory) are listed below for reference.     Microbiology: Recent Results (from the past 240 hour(s))  Respiratory Panel by RT PCR (Flu A&B, Covid) - Nasopharyngeal Swab     Status: None   Collection Time: 08/29/19 11:45 PM   Specimen: Nasopharyngeal Swab  Result Value Ref Range Status   SARS  Coronavirus 2 by RT PCR NEGATIVE NEGATIVE Final    Comment: (NOTE) SARS-CoV-2 target nucleic acids are NOT DETECTED. The SARS-CoV-2 RNA is generally detectable in upper respiratoy specimens during the acute phase of infection. The lowest concentration of SARS-CoV-2 viral copies this assay can detect is 131 copies/mL. A negative result does not preclude SARS-Cov-2 infection and should not be used as the sole basis for treatment or other patient management decisions. A negative result may occur with  improper specimen collection/handling, submission of specimen other than nasopharyngeal swab, presence of viral mutation(s) within the areas targeted by this assay, and inadequate  number of viral copies (<131 copies/mL). A negative result must be combined with clinical observations, patient history, and epidemiological information. The expected result is Negative. Fact Sheet for Patients:  PinkCheek.be Fact Sheet for Healthcare Providers:  GravelBags.it This test is not yet ap proved or cleared by the [place] FDA and  has been authorized for detection and/or diagnosis of SARS-CoV-2 by FDA under an Emergency Use Authorization (EUA). This EUA will remain  in effect (meaning this test can be used) for the duration of the COVID-19 declaration under Section 564(b)(1) of the Act, 21 U.S.C. section 360bbb-3(b)(1), unless the authorization is terminated or revoked sooner.    Influenza A by PCR NEGATIVE NEGATIVE Final   Influenza B by PCR NEGATIVE NEGATIVE Final    Comment: (NOTE) The Xpert Xpress SARS-CoV-2/FLU/RSV assay is intended as an aid in  the diagnosis of influenza from Nasopharyngeal swab specimens and  should not be used as a sole basis for treatment. Nasal washings and  aspirates are unacceptable for Xpert Xpress SARS-CoV-2/FLU/RSV  testing. Fact Sheet for Patients: PinkCheek.be Fact Sheet for Healthcare Providers: GravelBags.it This test is not yet approved or cleared by the [place] FDA and  has been authorized for detection and/or diagnosis of SARS-CoV-2 by  FDA under an Emergency Use Authorization (EUA). This EUA will remain  in effect (meaning this test can be used) for the duration of the  Covid-19 declaration under Section 564(b)(1) of the Act, 21  U.S.C. section 360bbb-3(b)(1), unless the authorization is  terminated or revoked. Performed at [facility], [address], [place], [place] [address]   Blood culture (routine x 2)     Status: None (Preliminary result)   Collection Time: 08/29/19 11:51 PM   Specimen: BLOOD   Result Value Ref Range Status   Specimen Description BLOOD LEFT ANTECUBITAL  Final   Special Requests   Final    BOTTLES DRAWN AEROBIC AND ANAEROBIC Blood Culture adequate volume   Culture   Final    NO GROWTH 1 DAY Performed at [facility], [address] [place], [place] [address]    Report Status PENDING  Incomplete  Blood culture (routine x 2)     Status: None (Preliminary result)   Collection Time: 08/29/19 11:56 PM   Specimen: BLOOD RIGHT HAND  Result Value Ref Range Status   Specimen Description BLOOD RIGHT HAND  Final   Special Requests   Final    BOTTLES DRAWN AEROBIC AND ANAEROBIC Blood Culture adequate volume   Culture   Final    NO GROWTH 1 DAY Performed at [facility], [address], [place], [place] [address]    Report Status PENDING  Incomplete  Urine culture     Status: Abnormal   Collection Time: 08/30/19  2:22 AM   Specimen: Urine, Clean Catch  Result Value Ref Range Status   Specimen Description   Final  URINE, CLEAN CATCH Performed at [facility], [address], [place], [place] [address]    Special Requests   Final    NONE Performed at [facility], [address], [place], [place] [address]    Culture (A)  Final    <10,000 COLONIES/mL INSIGNIFICANT GROWTH Performed at [place] [address], [place], [place] [address]    Report Status 08/31/2019 FINAL  Final     Labs: BNP (last 3 results) No results for input(s): BNP in the last 8760 hours. Basic Metabolic Panel: Recent Labs  Lab 08/29/19 2027 08/31/19 0519  NA 137 136  K 4.7 4.3  CL 104 103  CO2 22 25  GLUCOSE 206* 123*  BUN 34* 22  CREATININE 1.45* 1.29*  CALCIUM 8.8* 8.2*   Liver Function Tests: Recent Labs  Lab 08/29/19 2027  AST 20  ALT 33  ALKPHOS 62  BILITOT 0.6  PROT 7.5  ALBUMIN 3.5   Recent Labs  Lab 08/29/19 2027  LIPASE 29   No results for input(s): AMMONIA in the last 168 hours. CBC: Recent Labs  Lab 08/29/19 2027 08/31/19 0519  WBC  16.4* 9.3  HGB 10.3* 9.3*  HCT 32.7* 28.8*  MCV 90.8 90.9  PLT 287 246   Cardiac Enzymes: No results for input(s): CKTOTAL, CKMB, CKMBINDEX, TROPONINI in the last 168 hours. BNP: Invalid input(s): POCBNP CBG: No results for input(s): GLUCAP in the last 168 hours. D-Dimer No results for input(s): DDIMER in the last 72 hours. Hgb A1c No results for input(s): HGBA1C in the last 72 hours. Lipid Profile No results for input(s): CHOL, HDL, LDLCALC, TRIG, CHOLHDL, LDLDIRECT in the last 72 hours. Thyroid function studies No results for input(s): TSH, T4TOTAL, T3FREE, THYROIDAB in the last 72 hours.  Invalid input(s): FREET3 Anemia work up No results for input(s): VITAMINB12, FOLATE, FERRITIN, TIBC, IRON, RETICCTPCT in the last 72 hours. Urinalysis    Component Value Date/Time   COLORURINE YELLOW 08/29/2019 2258   APPEARANCEUR CLEAR 08/29/2019 2258   LABSPEC 1.018 08/29/2019 2258   PHURINE 5.0 08/29/2019 2258   GLUCOSEU NEGATIVE 08/29/2019 2258   HGBUR NEGATIVE 08/29/2019 2258   [place] 08/29/2019 2258   [place] 08/29/2019 2258   PROTEINUR 30 (A) 08/29/2019 2258   NITRITE NEGATIVE 08/29/2019 2258   LEUKOCYTESUR NEGATIVE 08/29/2019 2258   Sepsis Labs Invalid input(s): PROCALCITONIN,  WBC,  LACTICIDVEN Microbiology Recent Results (from the past 240 hour(s))  Respiratory Panel by RT PCR (Flu A&B, Covid) - Nasopharyngeal Swab     Status: None   Collection Time: 08/29/19 11:45 PM   Specimen: Nasopharyngeal Swab  Result Value Ref Range Status   SARS Coronavirus 2 by RT PCR NEGATIVE NEGATIVE Final    Comment: (NOTE) SARS-CoV-2 target nucleic acids are NOT DETECTED. The SARS-CoV-2 RNA is generally detectable in upper respiratoy specimens during the acute phase of infection. The lowest concentration of SARS-CoV-2 viral copies this assay can detect is 131 copies/mL. A negative result does not preclude SARS-Cov-2 infection and should not be used as the sole  basis for treatment or other patient management decisions. A negative result may occur with  improper specimen collection/handling, submission of specimen other than nasopharyngeal swab, presence of viral mutation(s) within the areas targeted by this assay, and inadequate number of viral copies (<131 copies/mL). A negative result must be combined with clinical observations, patient history, and epidemiological information. The expected result is Negative. Fact Sheet for Patients:  PinkCheek.be Fact Sheet for Healthcare Providers:  GravelBags.it This test is not yet ap proved or cleared by the [place] and  has been authorized for detection and/or diagnosis of SARS-CoV-2 by FDA under an Emergency Use Authorization (EUA). This EUA will remain  in effect (meaning this test can be used) for the duration of the COVID-19 declaration under Section 564(b)(1) of the Act, 21 U.S.C. section 360bbb-3(b)(1), unless the authorization is terminated or revoked sooner.    Influenza A by PCR NEGATIVE NEGATIVE Final   Influenza B by PCR NEGATIVE NEGATIVE Final    Comment: (NOTE) The Xpert Xpress SARS-CoV-2/FLU/RSV assay is intended as an aid in  the diagnosis of influenza from Nasopharyngeal swab specimens and  should not be used as a sole basis for treatment. Nasal washings and  aspirates are unacceptable for Xpert Xpress SARS-CoV-2/FLU/RSV  testing. Fact Sheet for Patients: PinkCheek.be Fact Sheet for Healthcare Providers: GravelBags.it This test is not yet approved or cleared by the [place] FDA and  has been authorized for detection and/or diagnosis of SARS-CoV-2 by  FDA under an Emergency Use Authorization (EUA). This EUA will remain  in effect (meaning this test can be used) for the duration of the  Covid-19 declaration under Section 564(b)(1) of the Act, 21  U.S.C.  section 360bbb-3(b)(1), unless the authorization is  terminated or revoked. Performed at [facility], [address] [place], [place] [address]   Blood culture (routine x 2)     Status: None (Preliminary result)   Collection Time: 08/29/19 11:51 PM   Specimen: BLOOD  Result Value Ref Range Status   Specimen Description BLOOD LEFT ANTECUBITAL  Final   Special Requests   Final    BOTTLES DRAWN AEROBIC AND ANAEROBIC Blood Culture adequate volume   Culture   Final    NO GROWTH 1 DAY Performed at [facility], [address] [place], [place] [address]    Report Status PENDING  Incomplete  Blood culture (routine x 2)     Status: None (Preliminary result)   Collection Time: 08/29/19 11:56 PM   Specimen: BLOOD RIGHT HAND  Result Value Ref Range Status   Specimen Description BLOOD RIGHT HAND  Final   Special Requests   Final    BOTTLES DRAWN AEROBIC AND ANAEROBIC Blood Culture adequate volume   Culture   Final    NO GROWTH 1 DAY Performed at [facility], [address], [place], [place] [address]    Report Status PENDING  Incomplete  Urine culture     Status: Abnormal   Collection Time: 08/30/19  2:22 AM   Specimen: Urine, Clean Catch  Result Value Ref Range Status   Specimen Description   Final    URINE, CLEAN CATCH Performed at [facility], [address] [place], [place] [address]    Special Requests   Final    NONE Performed at [facility], [address], [place], [place] [address]    Culture (A)  Final    <10,000 COLONIES/mL INSIGNIFICANT GROWTH Performed at [facility], [place] [address], [place], [place] [address]    Report Status 08/31/2019 FINAL  Final     Time coordinating discharge: 38mins  SIGNED:   Kathie Dike, MD  [facility] 08/31/2019, 9:27 PM   If 7PM-7AM, please contact night-coverage [email or web address]

## 2019-08-31 NOTE — Plan of Care (Signed)
  Problem: Education: Goal: Knowledge of General Education information will improve Description: Including pain rating scale, medication(s)/side effects and non-pharmacologic comfort measures Outcome: Progressing   Problem: Clinical Measurements: Goal: Ability to maintain clinical measurements within normal limits will improve Outcome: Progressing   Problem: Nutrition: Goal: Adequate nutrition will be maintained Outcome: Progressing   Problem: Elimination: Goal: Will not experience complications related to bowel motility Outcome: Progressing   Problem: Safety: Goal: Ability to remain free from injury will improve Outcome: Progressing   Problem: Skin Integrity: Goal: Risk for impaired skin integrity will decrease Outcome: Progressing

## 2019-08-31 NOTE — Plan of Care (Signed)
  Problem: Education: Goal: Knowledge of General Education information will improve Description: Including pain rating scale, medication(s)/side effects and non-pharmacologic comfort measures 08/31/2019 1759 by Zachery Conch, RN Outcome: Adequate for Discharge 08/31/2019 0914 by Zachery Conch, RN Outcome: Progressing   Problem: Health Behavior/Discharge Planning: Goal: Ability to manage health-related needs will improve Outcome: Adequate for Discharge   Problem: Clinical Measurements: Goal: Ability to maintain clinical measurements within normal limits will improve 08/31/2019 1759 by Zachery Conch, RN Outcome: Adequate for Discharge 08/31/2019 0914 by Zachery Conch, RN Outcome: Progressing Goal: Will remain free from infection Outcome: Adequate for Discharge Goal: Diagnostic test results will improve Outcome: Adequate for Discharge Goal: Respiratory complications will improve Outcome: Adequate for Discharge Goal: Cardiovascular complication will be avoided Outcome: Adequate for Discharge   Problem: Activity: Goal: Risk for activity intolerance will decrease Outcome: Adequate for Discharge   Problem: Nutrition: Goal: Adequate nutrition will be maintained 08/31/2019 1759 by Zachery Conch, RN Outcome: Adequate for Discharge 08/31/2019 0914 by Zachery Conch, RN Outcome: Progressing   Problem: Coping: Goal: Level of anxiety will decrease Outcome: Adequate for Discharge   Problem: Elimination: Goal: Will not experience complications related to bowel motility 08/31/2019 1759 by Zachery Conch, RN Outcome: Adequate for Discharge 08/31/2019 0914 by Zachery Conch, RN Outcome: Progressing Goal: Will not experience complications related to urinary retention Outcome: Adequate for Discharge   Problem: Pain Managment: Goal: General experience of comfort will improve Outcome: Adequate for Discharge   Problem: Safety: Goal: Ability to remain free from injury will  improve 08/31/2019 1759 by Zachery Conch, RN Outcome: Adequate for Discharge 08/31/2019 0914 by Zachery Conch, RN Outcome: Progressing   Problem: Skin Integrity: Goal: Risk for impaired skin integrity will decrease 08/31/2019 1759 by Zachery Conch, RN Outcome: Adequate for Discharge 08/31/2019 0914 by Zachery Conch, RN Outcome: Progressing

## 2019-08-31 NOTE — H&P (Signed)
Primary Care Physician:  Rory Percy, MD Primary Gastroenterologist:  Dr. Oneida Alar  Pre-Procedure History & Physical: HPI:  Christopher Nelson is a 79 y.o. male here for  [place].  Past Medical History:  Diagnosis Date  . Arthritis   . CKD (chronic kidney disease), stage III   . Coronary artery disease    a. unstable angina s/p cath 01/2017 -  specifically DES to a large branching ramus intermedius. Residual disease includes ostial occlusion of the LAD with right-to-left collaterals, also 90% posterolateral stenosis. EF 35-40% by cath but then 55-60% by echo.  . Essential hypertension   . GERD (gastroesophageal reflux disease)   . Gout   . History of kidney stones   . Ischemic cardiomyopathy    a. EF 35-40% by cath then 55-60% by echo shortly after in 01/2017.  . Mitral valve disease    a. 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  . Pre-diabetes     Past Surgical History:  Procedure Laterality Date  . CARDIAC CATHETERIZATION  01/06/2017  . cataract s Left 04/2017  . CHOLECYSTECTOMY    . COLONOSCOPY WITH ESOPHAGOGASTRODUODENOSCOPY (EGD)  06/2017   [facility]: erosive gastritis, evidence of prior gastric ulcers (scarring), no h.pylori. pancolonic diverticulosis (suspected source for his bleeding), internal hemorrhoids  . CORONARY STENT INTERVENTION  01/06/2017   CORONARY STENT INTERVENTION  . CORONARY STENT INTERVENTION N/A 01/06/2017   Procedure: CORONARY STENT INTERVENTION;  Surgeon: Troy Sine, MD;  Location: [place] [facility];  Service: Cardiovascular;  Laterality: N/A;  . LEFT HEART CATH AND CORONARY ANGIOGRAPHY N/A 01/06/2017   Procedure: LEFT HEART CATH AND CORONARY ANGIOGRAPHY;  Surgeon: Troy Sine, MD;  Location: [place] [facility];  Service: Cardiovascular;  Laterality: N/A;  . LITHOTRIPSY    . TONSILLECTOMY    . TOTAL HIP ARTHROPLASTY Right 05/15/2018   Procedure: TOTAL HIP ARTHROPLASTY ANTERIOR  APPROACH;  Surgeon: Frederik Pear, MD;  Location: [facility];  Service: Orthopedics;  Laterality: Right;    Prior to Admission medications   Medication Sig Start Date End Date Taking? Authorizing Provider  amLODipine (NORVASC) 5 MG tablet TAKE 1 TABLET BY MOUTH ONCE DAILY . APPOINTMENT REQUIRED FOR FUTURE REFILLS 04/02/19  Yes Satira Sark, MD  losartan-hydrochlorothiazide [facility]) 50-12.5 MG tablet Take 0.5 tablets by mouth daily.   Yes [provider]  Ascorbic Acid (VITAMIN C PO) Take 1,000 mg by mouth daily.     [provider]  aspirin EC 81 MG tablet Take 81 mg by mouth daily.    [provider]  b complex vitamins tablet Take 1 tablet by mouth daily.    [provider]  Cholecalciferol (VITAMIN D-3) 1000 units CAPS Take 1,000 Units by mouth daily.    [provider]  Coenzyme Q10 (COQ-10) 200 MG CAPS Take 200 mg by mouth daily.     [provider]  Lysine 1000 MG TABS Take 1,000 mg by mouth daily.     [provider]  MAGNESIUM PO Take 250 mg by mouth daily.     [provider]  zinc gluconate 50 MG tablet Take 50 mg by mouth daily.    [provider]    Allergies as of 08/29/2019  . (No Known Allergies)    Family History  Problem Relation Age of Onset  . Uterine cancer Mother   . Diabetes Mellitus II Sister   . Heart attack Brother  Age 84  . Pancreatic cancer Sister     Social History   Socioeconomic History  . Marital status: Married    Spouse name: Not on file  . Number of children: Not on file  . Years of education: Not on file  . Highest education level: Not on file  Occupational History  . Not on file  Tobacco Use  . Smoking status: Never Smoker  . Smokeless tobacco: Never Used  Substance and Sexual Activity  . Alcohol use: No  . Drug use: No  . Sexual activity: Yes  Other Topics Concern  . Not on file  Social History Narrative  . Not on file   Social Determinants  of Health   Financial Resource Strain:   . Difficulty of Paying Living Expenses:   Food Insecurity:   . Worried About Charity fundraiser in the Last Year:   . Arboriculturist in the Last Year:   Transportation Needs:   . Film/video editor (Medical):   Marland Kitchen Lack of Transportation (Non-Medical):   Physical Activity:   . Days of Exercise per Week:   . Minutes of Exercise per Session:   Stress:   . Feeling of Stress :   Social Connections:   . Frequency of Communication with Friends and Family:   . Frequency of Social Gatherings with Friends and Family:   . Attends Religious Services:   . Active Member of Clubs or Organizations:   . Attends Archivist Meetings:   Marland Kitchen Marital Status:   Intimate Partner Violence:   . Fear of Current or Ex-Partner:   . Emotionally Abused:   Marland Kitchen Physically Abused:   . Sexually Abused:     Review of Systems: See HPI, otherwise negative ROS   Physical Exam: BP (!) 172/100   Pulse 89   Temp 97.8 F (36.6 C) (Oral)   Resp 20   Ht 6\' 2"  (1.88 m)   Wt 103.4 kg   SpO2 100%   BMI 29.27 kg/m  General:   Alert,  pleasant and cooperative in NAD Head:  Normocephalic and atraumatic. Neck:  Supple; Lungs:  Clear throughout to auscultation.    Heart:  Regular rate and rhythm. Abdomen:  Soft, nontender and nondistended. Normal bowel sounds, without guarding, and without rebound.   Neurologic:  Alert and  oriented x4;  grossly normal neurologically.  Impression/Plan:    MELENA  PLAN: 1. EGD TODAY.  DISCUSSED PROCEDURE, BENEFITS, & RISKS: < 1% chance of medication reaction, bleeding, perforation, or ASPIRATION.

## 2019-08-31 NOTE — Progress Notes (Signed)
Nsg Discharge Note  Admit Date:  08/29/2019 Discharge date: 08/31/2019   Christopher Nelson to be D/C'd home per MD order.  AVS completed.  Copy for chart, and copy for patient signed, and dated. Patient/caregiver able to verbalize understanding.  Discharge Medication: Allergies as of 08/31/2019   No Known Allergies     Medication List    STOP taking these medications   losartan-hydrochlorothiazide 50-12.5 MG tablet Commonly known as: HYZAAR     TAKE these medications   amLODipine 5 MG tablet Commonly known as: NORVASC TAKE 1 TABLET BY MOUTH ONCE DAILY . APPOINTMENT REQUIRED FOR FUTURE REFILLS   aspirin EC 81 MG tablet Take 81 mg by mouth daily.   b complex vitamins tablet Take 1 tablet by mouth daily.   CoQ-10 200 MG Caps Take 200 mg by mouth daily.   Lysine 1000 MG Tabs Take 1,000 mg by mouth daily.   MAGNESIUM PO Take 250 mg by mouth daily.   pantoprazole 40 MG tablet Commonly known as: Protonix Take 1 tablet (40 mg total) by mouth 2 (two) times daily.   VITAMIN C PO Take 1,000 mg by mouth daily.   Vitamin D-3 25 MCG (1000 UT) Caps Take 1,000 Units by mouth daily.   zinc gluconate 50 MG tablet Take 50 mg by mouth daily.       Discharge Assessment: Vitals:   08/31/19 1200 08/31/19 1457  BP: 136/78 132/78  Pulse: 86 87  Resp: 17 18  Temp:    SpO2: 97% 100%   Skin clean, dry and intact without evidence of skin break down, no evidence of skin tears noted. IV catheter discontinued intact. Site without signs and symptoms of complications - no redness or edema noted at insertion site, patient denies c/o pain - only slight tenderness at site.  Dressing with slight pressure applied.  D/c Instructions-Education: Discharge instructions given to patient/family with verbalized understanding. D/c education completed with patient/family including follow up instructions, medication list, d/c activities limitations if indicated, with other d/c instructions as  indicated by MD - patient able to verbalize understanding, all questions fully answered. Patient instructed to return to ED, call 911, or call MD for any changes in condition.  Patient escorted via [place], and D/C home via private auto.  Zachery Conch, RN 08/31/2019 6:00 PM

## 2019-08-31 NOTE — Op Note (Addendum)
NO NEED TO REPEAT EGD DUE TO PT HAVING ULCER BIOPSY APR 2021(6:PERIPHERY/2: CENTRAL) OF GASTRIC ULCER.   [facility] Patient Name: Christopher Nelson Procedure Date: 08/31/2019 10:32 AM MRN: [id number] Date of Birth: 07-Jun-1940 Attending MD: Barney Drain MD, MD CSN: [id number] Age: 79 Admit Type: Outpatient Procedure:                Upper GI endoscopy WITH COLD FORCEPS BIOPSY Indications:              Dyspepsia, Melena, Early satiety Providers:                Barney Drain MD, MD, Otis Peak B. Sharon Seller, RN,                            Raphael Gibney, Technician Referring MD:             Rory Percy, MD Medicines:                Meperidine 50 mg IV, Midazolam 4 mg IV, HYDRALAZINE                            10 MG IV Complications:            No immediate complications. SBP ELEVATED DURING                            EGD. HYDRALAZINE 10 MG IV GIVEN. Estimated Blood Loss:     Estimated blood loss was minimal. Procedure:                Pre-Anesthesia Assessment:                           - Prior to the procedure, a History and Physical                            was performed, and patient medications and                            allergies were reviewed. The patient's tolerance of                            previous anesthesia was also reviewed. The risks                            and benefits of the procedure and the sedation                            options and risks were discussed with the patient.                            All questions were answered, and informed consent                            was obtained. Prior Anticoagulants: The patient has  taken no previous anticoagulant or antiplatelet                            agents except for NSAID medication. ASA Grade                            Assessment: III - A patient with severe systemic                            disease. After reviewing the risks and benefits,                            the patient  was deemed in satisfactory condition to                            undergo the procedure. After obtaining informed                            consent, the endoscope was passed under direct                            vision. Throughout the procedure, the patient's                            blood pressure, pulse, and oxygen saturations were                            monitored continuously. The [id number]) was                            introduced through the mouth, and advanced to the                            second part of duodenum. The upper GI endoscopy was                            accomplished without difficulty. The patient                            tolerated the procedure well. Scope In: 11:29:46 AM Scope Out: 11:48:44 AM Total Procedure Duration: 0 hours 18 minutes 58 seconds  Findings:      The examined esophagus was normal.      Localized mild inflammation characterized by congestion (edema),       erosions and erythema was found in the gastric fundus, in the gastric       body and in the gastric antrum. Biopsies were taken with a cold forceps       for Helicobacter pylori testing.      One non-bleeding cratered gastric ulcer with no stigmata of bleeding was       found in the prepyloric region of the stomach. The lesion was six mm by       twenty mm in largest dimension. Biopsies were taken with a cold forceps       for histology.  Localized mild inflammation characterized by congestion (edema) and       erythema was found in the duodenal bulb. Biopsies for histology were       taken with a cold forceps for evaluation of celiac disease.      A small non-bleeding diverticulum was found in the second portion of the       duodenum and at the major papilla. Biopsies for histology were taken       with a cold forceps for evaluation of celiac disease.      -MULTIPLE ULCERS ON SOFT PALATE Impression:               - MODERATE Gastritis. Biopsied.                            - MELENA DUE TO gastric ulcer with no stigmata of                            bleeding. Biopsied.                           - MILD Duodenitis. Biopsied.                           - Non-bleeding duodenal diverticulum.                           - MULTIPLE ULCERS ON THE SOFT PALATE Moderate Sedation:      Moderate (conscious) sedation was administered by the endoscopy nurse       and supervised by the endoscopist. The following parameters were       monitored: oxygen saturation, heart rate, blood pressure, and response       to care. Total physician intraservice time was 27 minutes. Recommendation:           - Return patient to hospital ward for ongoing care.                           - Full liquid diet.                           - Continue present medications.                           - Await pathology results.                           - Return to GI office in 4 months. Procedure Code(s):        --- Professional ---                           [id number], Esophagogastroduodenoscopy, flexible,                            transoral; with biopsy, single or multiple                           99153, Moderate sedation; each additional 15  minutes intraservice time                           G0500, Moderate sedation services provided by the                            same physician or other qualified health care                            professional performing a gastrointestinal                            endoscopic service that sedation supports,                            requiring the presence of an independent trained                            observer to assist in the monitoring of the                            patient's level of consciousness and physiological                            status; initial 15 minutes of intra-service time;                            patient age 40 years or older (additional time may                            be reported with [phone], as  appropriate) Diagnosis Code(s):        --- Professional ---                           K29.70, Gastritis, unspecified, without bleeding                           K25.9, Gastric ulcer, unspecified as acute or                            chronic, without hemorrhage or perforation                           K29.80, Duodenitis without bleeding                           R10.13, Epigastric pain                           K92.1, Melena (includes Hematochezia)                           R68.81, Early satiety                           K57.10, Diverticulosis of small intestine without  perforation or abscess without bleeding CPT copyright 2019 [employer]. All rights reserved. The codes documented in this report are preliminary and upon coder review may  be revised to meet current compliance requirements. Barney Drain, MD Barney Drain MD, MD 08/31/2019 12:07:53 PM This report has been signed electronically. Number of Addenda: 0

## 2019-09-03 ENCOUNTER — Telehealth: Payer: Self-pay | Admitting: Gastroenterology

## 2019-09-03 ENCOUNTER — Encounter: Payer: Self-pay | Admitting: *Deleted

## 2019-09-03 ENCOUNTER — Other Ambulatory Visit: Payer: Self-pay

## 2019-09-03 LAB — SURGICAL PATHOLOGY

## 2019-09-03 NOTE — Telephone Encounter (Signed)
Please call pt. His ULCER AND stomach Bx shows gastritis/ULCER DUE TO ASPIRIN AND IBUPROFEN. CONTINUE PROTONIX. TAKE 30 MINUTES PRIOR TO MEALS TWICE DAILY FOR ONE MONTH THEN ONCE DAILY FOREVER.

## 2019-09-03 NOTE — Telephone Encounter (Signed)
NO NEED FOR REPEAT EGD. PT HAD ULCER BIOPSIES(6:PERIPHERY, 2:CENTRAL).

## 2019-09-03 NOTE — Telephone Encounter (Signed)
Called verified pt name and dob Notified pt of results and rx Rx was sent in to [facility] in [place] on 4/30 by doctor menon  Pt thanked me for the call and stated he understood

## 2019-09-04 LAB — CULTURE, BLOOD (ROUTINE X 2)
Culture: NO GROWTH
Culture: NO GROWTH
Special Requests: ADEQUATE
Special Requests: ADEQUATE

## 2019-09-06 ENCOUNTER — Ambulatory Visit: Payer: Medicare Other | Admitting: Gastroenterology

## 2019-09-20 DIAGNOSIS — E782 Mixed hyperlipidemia: Secondary | ICD-10-CM | POA: Diagnosis not present

## 2019-09-20 DIAGNOSIS — I10 Essential (primary) hypertension: Secondary | ICD-10-CM | POA: Diagnosis not present

## 2019-09-20 DIAGNOSIS — E1165 Type 2 diabetes mellitus with hyperglycemia: Secondary | ICD-10-CM | POA: Diagnosis not present

## 2019-09-20 DIAGNOSIS — N400 Enlarged prostate without lower urinary tract symptoms: Secondary | ICD-10-CM | POA: Diagnosis not present

## 2019-09-20 DIAGNOSIS — R5382 Chronic fatigue, unspecified: Secondary | ICD-10-CM | POA: Diagnosis not present

## 2019-09-24 DIAGNOSIS — E782 Mixed hyperlipidemia: Secondary | ICD-10-CM | POA: Diagnosis not present

## 2019-09-24 DIAGNOSIS — Z6830 Body mass index (BMI) 30.0-30.9, adult: Secondary | ICD-10-CM | POA: Diagnosis not present

## 2019-09-24 DIAGNOSIS — I25119 Atherosclerotic heart disease of native coronary artery with unspecified angina pectoris: Secondary | ICD-10-CM | POA: Diagnosis not present

## 2019-09-24 DIAGNOSIS — E1165 Type 2 diabetes mellitus with hyperglycemia: Secondary | ICD-10-CM | POA: Diagnosis not present

## 2019-09-24 DIAGNOSIS — I341 Nonrheumatic mitral (valve) prolapse: Secondary | ICD-10-CM | POA: Diagnosis not present

## 2019-09-24 DIAGNOSIS — I255 Ischemic cardiomyopathy: Secondary | ICD-10-CM | POA: Diagnosis not present

## 2019-09-24 DIAGNOSIS — I10 Essential (primary) hypertension: Secondary | ICD-10-CM | POA: Diagnosis not present

## 2019-10-04 DIAGNOSIS — R1013 Epigastric pain: Secondary | ICD-10-CM | POA: Diagnosis not present

## 2019-10-04 DIAGNOSIS — R509 Fever, unspecified: Secondary | ICD-10-CM | POA: Diagnosis not present

## 2019-10-04 DIAGNOSIS — Z6829 Body mass index (BMI) 29.0-29.9, adult: Secondary | ICD-10-CM | POA: Diagnosis not present

## 2019-10-05 DIAGNOSIS — R109 Unspecified abdominal pain: Secondary | ICD-10-CM | POA: Diagnosis not present

## 2019-10-10 ENCOUNTER — Encounter: Payer: Self-pay | Admitting: Gastroenterology

## 2019-10-16 DIAGNOSIS — R61 Generalized hyperhidrosis: Secondary | ICD-10-CM | POA: Diagnosis not present

## 2019-10-16 DIAGNOSIS — R1013 Epigastric pain: Secondary | ICD-10-CM | POA: Diagnosis not present

## 2019-10-16 DIAGNOSIS — Z6830 Body mass index (BMI) 30.0-30.9, adult: Secondary | ICD-10-CM | POA: Diagnosis not present

## 2019-10-16 DIAGNOSIS — R700 Elevated erythrocyte sedimentation rate: Secondary | ICD-10-CM | POA: Diagnosis not present

## 2019-10-16 DIAGNOSIS — R509 Fever, unspecified: Secondary | ICD-10-CM | POA: Diagnosis not present

## 2019-10-16 DIAGNOSIS — D649 Anemia, unspecified: Secondary | ICD-10-CM | POA: Diagnosis not present

## 2019-10-25 DIAGNOSIS — K219 Gastro-esophageal reflux disease without esophagitis: Secondary | ICD-10-CM | POA: Diagnosis not present

## 2019-10-25 DIAGNOSIS — D649 Anemia, unspecified: Secondary | ICD-10-CM | POA: Diagnosis not present

## 2019-10-29 DIAGNOSIS — K219 Gastro-esophageal reflux disease without esophagitis: Secondary | ICD-10-CM | POA: Diagnosis not present

## 2019-10-29 DIAGNOSIS — R1084 Generalized abdominal pain: Secondary | ICD-10-CM | POA: Diagnosis not present

## 2019-10-29 DIAGNOSIS — D649 Anemia, unspecified: Secondary | ICD-10-CM | POA: Diagnosis not present

## 2019-11-02 DIAGNOSIS — Z20822 Contact with and (suspected) exposure to covid-19: Secondary | ICD-10-CM | POA: Diagnosis not present

## 2019-11-02 DIAGNOSIS — R109 Unspecified abdominal pain: Secondary | ICD-10-CM | POA: Diagnosis not present

## 2019-11-02 DIAGNOSIS — R0602 Shortness of breath: Secondary | ICD-10-CM | POA: Diagnosis not present

## 2019-11-02 DIAGNOSIS — D649 Anemia, unspecified: Secondary | ICD-10-CM | POA: Diagnosis not present

## 2019-11-02 DIAGNOSIS — I517 Cardiomegaly: Secondary | ICD-10-CM | POA: Diagnosis not present

## 2019-11-02 DIAGNOSIS — I509 Heart failure, unspecified: Secondary | ICD-10-CM | POA: Diagnosis not present

## 2019-11-03 ENCOUNTER — Other Ambulatory Visit: Payer: Self-pay

## 2019-11-03 ENCOUNTER — Encounter (HOSPITAL_COMMUNITY): Payer: Self-pay | Admitting: Internal Medicine

## 2019-11-03 ENCOUNTER — Inpatient Hospital Stay (HOSPITAL_COMMUNITY)
Admission: AD | Admit: 2019-11-03 | Discharge: 2019-11-08 | DRG: 250 | Disposition: A | Discharge status: Home / Self Care | Payer: Medicare Other | Source: Other Acute Inpatient Hospital | Attending: Internal Medicine | Admitting: Internal Medicine

## 2019-11-03 DIAGNOSIS — Z7952 Long term (current) use of systemic steroids: Secondary | ICD-10-CM | POA: Diagnosis not present

## 2019-11-03 DIAGNOSIS — N1832 Chronic kidney disease, stage 3b: Secondary | ICD-10-CM | POA: Diagnosis present

## 2019-11-03 DIAGNOSIS — N210 Calculus in bladder: Secondary | ICD-10-CM

## 2019-11-03 DIAGNOSIS — I5021 Acute systolic (congestive) heart failure: Secondary | ICD-10-CM | POA: Diagnosis not present

## 2019-11-03 DIAGNOSIS — D509 Iron deficiency anemia, unspecified: Secondary | ICD-10-CM | POA: Diagnosis present

## 2019-11-03 DIAGNOSIS — Z7982 Long term (current) use of aspirin: Secondary | ICD-10-CM | POA: Diagnosis not present

## 2019-11-03 DIAGNOSIS — D649 Anemia, unspecified: Secondary | ICD-10-CM | POA: Diagnosis present

## 2019-11-03 DIAGNOSIS — I5043 Acute on chronic combined systolic (congestive) and diastolic (congestive) heart failure: Secondary | ICD-10-CM | POA: Diagnosis present

## 2019-11-03 DIAGNOSIS — N200 Calculus of kidney: Secondary | ICD-10-CM | POA: Diagnosis not present

## 2019-11-03 DIAGNOSIS — I509 Heart failure, unspecified: Secondary | ICD-10-CM | POA: Insufficient documentation

## 2019-11-03 DIAGNOSIS — Z20822 Contact with and (suspected) exposure to covid-19: Secondary | ICD-10-CM | POA: Diagnosis present

## 2019-11-03 DIAGNOSIS — I255 Ischemic cardiomyopathy: Secondary | ICD-10-CM | POA: Diagnosis present

## 2019-11-03 DIAGNOSIS — K5730 Diverticulosis of large intestine without perforation or abscess without bleeding: Secondary | ICD-10-CM | POA: Diagnosis not present

## 2019-11-03 DIAGNOSIS — I472 Ventricular tachycardia: Secondary | ICD-10-CM | POA: Diagnosis not present

## 2019-11-03 DIAGNOSIS — I2582 Chronic total occlusion of coronary artery: Secondary | ICD-10-CM | POA: Diagnosis present

## 2019-11-03 DIAGNOSIS — K219 Gastro-esophageal reflux disease without esophagitis: Secondary | ICD-10-CM | POA: Diagnosis present

## 2019-11-03 DIAGNOSIS — M109 Gout, unspecified: Secondary | ICD-10-CM | POA: Diagnosis present

## 2019-11-03 DIAGNOSIS — I130 Hypertensive heart and chronic kidney disease with heart failure and stage 1 through stage 4 chronic kidney disease, or unspecified chronic kidney disease: Secondary | ICD-10-CM | POA: Diagnosis present

## 2019-11-03 DIAGNOSIS — D3500 Benign neoplasm of unspecified adrenal gland: Secondary | ICD-10-CM

## 2019-11-03 DIAGNOSIS — I340 Nonrheumatic mitral (valve) insufficiency: Secondary | ICD-10-CM

## 2019-11-03 DIAGNOSIS — D72829 Elevated white blood cell count, unspecified: Secondary | ICD-10-CM | POA: Diagnosis present

## 2019-11-03 DIAGNOSIS — I2510 Atherosclerotic heart disease of native coronary artery without angina pectoris: Secondary | ICD-10-CM

## 2019-11-03 DIAGNOSIS — N179 Acute kidney failure, unspecified: Secondary | ICD-10-CM | POA: Diagnosis present

## 2019-11-03 DIAGNOSIS — R188 Other ascites: Secondary | ICD-10-CM

## 2019-11-03 DIAGNOSIS — Z951 Presence of aortocoronary bypass graft: Secondary | ICD-10-CM | POA: Diagnosis not present

## 2019-11-03 DIAGNOSIS — E785 Hyperlipidemia, unspecified: Secondary | ICD-10-CM | POA: Diagnosis present

## 2019-11-03 DIAGNOSIS — Z955 Presence of coronary angioplasty implant and graft: Secondary | ICD-10-CM | POA: Diagnosis not present

## 2019-11-03 DIAGNOSIS — I428 Other cardiomyopathies: Secondary | ICD-10-CM | POA: Diagnosis present

## 2019-11-03 DIAGNOSIS — Z9049 Acquired absence of other specified parts of digestive tract: Secondary | ICD-10-CM

## 2019-11-03 DIAGNOSIS — R140 Abdominal distension (gaseous): Secondary | ICD-10-CM | POA: Diagnosis not present

## 2019-11-03 DIAGNOSIS — R509 Fever, unspecified: Secondary | ICD-10-CM | POA: Diagnosis not present

## 2019-11-03 DIAGNOSIS — N281 Cyst of kidney, acquired: Secondary | ICD-10-CM | POA: Diagnosis not present

## 2019-11-03 DIAGNOSIS — N189 Chronic kidney disease, unspecified: Secondary | ICD-10-CM

## 2019-11-03 DIAGNOSIS — Z79899 Other long term (current) drug therapy: Secondary | ICD-10-CM | POA: Diagnosis not present

## 2019-11-03 DIAGNOSIS — E1122 Type 2 diabetes mellitus with diabetic chronic kidney disease: Secondary | ICD-10-CM | POA: Diagnosis present

## 2019-11-03 DIAGNOSIS — I493 Ventricular premature depolarization: Secondary | ICD-10-CM

## 2019-11-03 DIAGNOSIS — D3502 Benign neoplasm of left adrenal gland: Secondary | ICD-10-CM | POA: Diagnosis present

## 2019-11-03 DIAGNOSIS — I348 Other nonrheumatic mitral valve disorders: Secondary | ICD-10-CM | POA: Diagnosis not present

## 2019-11-03 DIAGNOSIS — E118 Type 2 diabetes mellitus with unspecified complications: Secondary | ICD-10-CM

## 2019-11-03 DIAGNOSIS — R079 Chest pain, unspecified: Secondary | ICD-10-CM | POA: Diagnosis not present

## 2019-11-03 DIAGNOSIS — R109 Unspecified abdominal pain: Secondary | ICD-10-CM

## 2019-11-03 DIAGNOSIS — I25119 Atherosclerotic heart disease of native coronary artery with unspecified angina pectoris: Secondary | ICD-10-CM | POA: Diagnosis not present

## 2019-11-03 DIAGNOSIS — Z96641 Presence of right artificial hip joint: Secondary | ICD-10-CM | POA: Diagnosis present

## 2019-11-03 DIAGNOSIS — R0602 Shortness of breath: Secondary | ICD-10-CM

## 2019-11-03 LAB — CBC
HCT: 30.1 % — ABNORMAL LOW (ref 39.0–52.0)
Hemoglobin: 8.8 g/dL — ABNORMAL LOW (ref 13.0–17.0)
MCH: 24.1 pg — ABNORMAL LOW (ref 26.0–34.0)
MCHC: 29.2 g/dL — ABNORMAL LOW (ref 30.0–36.0)
MCV: 82.5 fL (ref 80.0–100.0)
Platelets: 287 10*3/uL (ref 150–400)
RBC: 3.65 MIL/uL — ABNORMAL LOW (ref 4.22–5.81)
RDW: 15 % (ref 11.5–15.5)
WBC: 9.7 10*3/uL (ref 4.0–10.5)
nRBC: 0 % (ref 0.0–0.2)

## 2019-11-03 LAB — BASIC METABOLIC PANEL
Anion gap: 7 (ref 5–15)
BUN: 23 mg/dL (ref 8–23)
CO2: 28 mmol/L (ref 22–32)
Calcium: 8.5 mg/dL — ABNORMAL LOW (ref 8.9–10.3)
Chloride: 103 mmol/L (ref 98–111)
Creatinine, Ser: 1.44 mg/dL — ABNORMAL HIGH (ref 0.61–1.24)
GFR calc Af Amer: 54 mL/min — ABNORMAL LOW (ref >60)
GFR calc non Af Amer: 46 mL/min — ABNORMAL LOW (ref >60)
Glucose, Bld: 149 mg/dL — ABNORMAL HIGH (ref 70–99)
Potassium: 3.8 mmol/L (ref 3.5–5.1)
Sodium: 138 mmol/L (ref 135–145)

## 2019-11-03 LAB — HIV ANTIBODY (ROUTINE TESTING W REFLEX): HIV Screen 4th Generation wRfx: NONREACTIVE

## 2019-11-03 LAB — CREATININE, SERUM
Creatinine, Ser: 1.52 mg/dL — ABNORMAL HIGH (ref 0.61–1.24)
GFR calc Af Amer: 50 mL/min — ABNORMAL LOW (ref >60)
GFR calc non Af Amer: 43 mL/min — ABNORMAL LOW (ref >60)

## 2019-11-03 LAB — TROPONIN I (HIGH SENSITIVITY): Troponin I (High Sensitivity): 29 ng/L — ABNORMAL HIGH (ref <18)

## 2019-11-03 MED ORDER — FUROSEMIDE 10 MG/ML IJ SOLN
20.0000 mg | Freq: Two times a day (BID) | INTRAMUSCULAR | Status: DC
  Administered 2019-11-03 – 2019-11-05 (×4): 20 mg via INTRAVENOUS
  Filled 2019-11-03 (×4): qty 2

## 2019-11-03 MED ORDER — ACETAMINOPHEN 325 MG PO TABS
650.0000 mg | ORAL_TABLET | ORAL | Status: DC | PRN
  Administered 2019-11-08: 650 mg via ORAL

## 2019-11-03 MED ORDER — ASCORBIC ACID 500 MG PO TABS
1000.0000 mg | ORAL_TABLET | Freq: Every day | ORAL | Status: DC
  Administered 2019-11-03 – 2019-11-08 (×6): 1000 mg via ORAL
  Filled 2019-11-03 (×6): qty 2

## 2019-11-03 MED ORDER — METOPROLOL TARTRATE 12.5 MG HALF TABLET
12.5000 mg | ORAL_TABLET | Freq: Two times a day (BID) | ORAL | Status: DC
  Administered 2019-11-03 – 2019-11-04 (×3): 12.5 mg via ORAL
  Filled 2019-11-03 (×4): qty 1

## 2019-11-03 MED ORDER — PANTOPRAZOLE SODIUM 40 MG PO TBEC
40.0000 mg | DELAYED_RELEASE_TABLET | Freq: Two times a day (BID) | ORAL | Status: DC
  Administered 2019-11-03 – 2019-11-08 (×10): 40 mg via ORAL
  Filled 2019-11-03 (×10): qty 1

## 2019-11-03 MED ORDER — SODIUM CHLORIDE 0.9 % IV SOLN: 250.0000 mL | INTRAVENOUS | Status: DC | PRN

## 2019-11-03 MED ORDER — METOPROLOL TARTRATE 25 MG PO TABS: 25.0000 mg | ORAL_TABLET | Freq: Two times a day (BID) | ORAL | Status: DC

## 2019-11-03 MED ORDER — POTASSIUM CHLORIDE CRYS ER 20 MEQ PO TBCR
20.0000 meq | EXTENDED_RELEASE_TABLET | Freq: Two times a day (BID) | ORAL | Status: DC
  Administered 2019-11-03 – 2019-11-05 (×4): 20 meq via ORAL
  Filled 2019-11-03 (×4): qty 1

## 2019-11-03 MED ORDER — SODIUM CHLORIDE 0.9% FLUSH: 3.0000 mL | INTRAVENOUS | Status: DC | PRN

## 2019-11-03 MED ORDER — ZOLPIDEM TARTRATE 5 MG PO TABS: 5.0000 mg | ORAL_TABLET | Freq: Every evening | ORAL | Status: DC | PRN

## 2019-11-03 MED ORDER — AMLODIPINE BESYLATE 5 MG PO TABS
5.0000 mg | ORAL_TABLET | Freq: Every day | ORAL | Status: DC
  Administered 2019-11-03 – 2019-11-04 (×2): 5 mg via ORAL
  Filled 2019-11-03 (×2): qty 1

## 2019-11-03 MED ORDER — ALPRAZOLAM 0.25 MG PO TABS
0.2500 mg | ORAL_TABLET | Freq: Two times a day (BID) | ORAL | Status: DC | PRN
  Administered 2019-11-05 – 2019-11-08 (×3): 0.25 mg via ORAL
  Filled 2019-11-03 (×3): qty 1

## 2019-11-03 MED ORDER — ATORVASTATIN CALCIUM 40 MG PO TABS
40.0000 mg | ORAL_TABLET | Freq: Every day | ORAL | Status: DC
  Administered 2019-11-04 – 2019-11-08 (×5): 40 mg via ORAL
  Filled 2019-11-03 (×5): qty 1

## 2019-11-03 MED ORDER — HEPARIN SODIUM (PORCINE) 5000 UNIT/ML IJ SOLN
5000.0000 [IU] | Freq: Three times a day (TID) | INTRAMUSCULAR | Status: DC
  Administered 2019-11-03 – 2019-11-07 (×9): 5000 [IU] via SUBCUTANEOUS
  Filled 2019-11-03 (×9): qty 1

## 2019-11-03 MED ORDER — SODIUM CHLORIDE 0.9% FLUSH
3.0000 mL | Freq: Two times a day (BID) | INTRAVENOUS | Status: DC
  Administered 2019-11-03 – 2019-11-07 (×7): 3 mL via INTRAVENOUS

## 2019-11-03 MED ORDER — ASPIRIN EC 81 MG PO TBEC
81.0000 mg | DELAYED_RELEASE_TABLET | Freq: Every day | ORAL | Status: DC
  Administered 2019-11-03 – 2019-11-08 (×6): 81 mg via ORAL
  Filled 2019-11-03 (×6): qty 1

## 2019-11-03 MED ORDER — ONDANSETRON HCL 4 MG/2ML IJ SOLN: 4.0000 mg | Freq: Four times a day (QID) | INTRAMUSCULAR | Status: DC | PRN

## 2019-11-03 MED ORDER — LOSARTAN POTASSIUM 25 MG PO TABS
25.0000 mg | ORAL_TABLET | Freq: Every day | ORAL | Status: DC
  Administered 2019-11-03 – 2019-11-04 (×2): 25 mg via ORAL
  Filled 2019-11-03 (×2): qty 1

## 2019-11-03 MED ORDER — VITAMIN C 500 MG/5ML PO SYRP
1000.0000 mg | ORAL_SOLUTION | Freq: Every day | ORAL | Status: DC
  Filled 2019-11-03: qty 10

## 2019-11-03 NOTE — H&P (Addendum)
Cardiology Admission History and Physical:   Patient ID: Christopher Nelson MRN: [id number]; DOB: 11-17-1940   Admission date: 11/03/2019  Primary Care Provider: Rory Percy, MD [facility] Cardiologist: Rozann Lesches, MD  [facility] Electrophysiologist:  None   Chief Complaint:  Chest pain  Patient Profile:   Christopher Nelson is a 79 y.o. male with CAD with DES to ramus intermedius 01/2017, myxomatous mitral valve, moderate MR, HLD and HTN.  Now admitted with chest pain and transferred from [facility].  History of Present Illness:   Mr. Sattler with hx of CAD with DES to Ramus intermediate 01/2017, total ostial occlusion of LAD, irregularity of OM1 vessel without high grade stenosis and calcification diffusely in RCA with 20% narrowing and luminal irregularities throughout with 90%  PL stenosis in a small distal vessel.  There is collateralization to the distal to mid LAD via the distal RCA.  EF 35-40%.  Recent echo 03/2019 with normal EF 55-60% G1DD.  Off statins due to side effects.    In April he had GI bleed due to ulcer.     Over last 4-6 weeks has been very weak after lunch and then has fever every afternoon evening from 100 to 101 and has been up to 104.  He takes tylenol and ice to face and fever resolves.   Other nights he has night sweats without fever.  He is seeing hematology  in [place] for eval.  No cause has been found.    Yesterday pt presented to [facility] with SOB and anemia, and then aching substernal non radiations chest pain, pain increased with breathing.  He has upper abd pain, in April CT with enlarged prostate and diverticulosis and had gastric ulcer on EGD.  CXR mild cardiomegaly , central vascular congestion with mild perihilar intestitional opacity suggestive of mild edema, no pneumo.       ABD film with nonobstructive gas pattern, mild to moderate stool no radiopaque calculi are visualized.   EKG:  The ECG that was done today with SR PACs and  PVCs interventricular conduction delay, LAD no acute changes from 02/02/19 except more premature beats. was personally reviewed. EKG from 2019 with RBBB   .    Labs from [place] Troponin <0.017 WBC 15.7, Hgb 9.5 plts 306  (10/25/19 hgb was 9.9, cr 1.51 Na 139, K+ 3.9, cl 102, CO2 27 BUN 27, Cr 1.56 Albumin 3 otherwise LFTs wnl TSH 0.765 Mg+ 2.1 INR 1.15  U/a with + protein.  He rec'd morphne zofran, NTG oint 1 inch, lasix 20 mg , lactulose   Currently BP 139/89, P 81 T 97.6  No pain now and breasting improved but not at nomal.  He put out a full urinal of urine and 3/4s of one.     Past Medical History:  Diagnosis Date  . Arthritis   . CKD (chronic kidney disease), stage III   . Coronary artery disease    a. unstable angina s/p cath 01/2017 -  specifically DES to a large branching ramus intermedius. Residual disease includes ostial occlusion of the LAD with right-to-left collaterals, also 90% posterolateral stenosis. EF 35-40% by cath but then 55-60% by echo.  . Essential hypertension   . GERD (gastroesophageal reflux disease)   . Gout   . History of kidney stones   . Ischemic cardiomyopathy    a. EF 35-40% by cath then 55-60% by echo shortly after in 01/2017.  . Mitral valve disease  a. 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  . Pre-diabetes     Past Surgical History:  Procedure Laterality Date  . BIOPSY  08/31/2019   Procedure: BIOPSY;  Surgeon: Danie Binder, MD;  Location: [facility];  Service: Endoscopy;;  duodenum gastric  . CARDIAC CATHETERIZATION  01/06/2017  . cataract s Left 04/2017  . CHOLECYSTECTOMY    . COLONOSCOPY WITH ESOPHAGOGASTRODUODENOSCOPY (EGD)  06/2017   [facility]: erosive gastritis, evidence of prior gastric ulcers (scarring), no h.pylori. pancolonic diverticulosis (suspected source for his bleeding), internal hemorrhoids  . CORONARY STENT INTERVENTION   01/06/2017   CORONARY STENT INTERVENTION  . CORONARY STENT INTERVENTION N/A 01/06/2017   Procedure: CORONARY STENT INTERVENTION;  Surgeon: Troy Sine, MD;  Location: [place] [facility];  Service: Cardiovascular;  Laterality: N/A;  . ESOPHAGOGASTRODUODENOSCOPY N/A 08/31/2019   Procedure: ESOPHAGOGASTRODUODENOSCOPY (EGD);  Surgeon: Danie Binder, MD;  Location: [facility];  Service: Endoscopy;  Laterality: N/A;  . LEFT HEART CATH AND CORONARY ANGIOGRAPHY N/A 01/06/2017   Procedure: LEFT HEART CATH AND CORONARY ANGIOGRAPHY;  Surgeon: Troy Sine, MD;  Location: [place] [facility];  Service: Cardiovascular;  Laterality: N/A;  . LITHOTRIPSY    . TONSILLECTOMY    . TOTAL HIP ARTHROPLASTY Right 05/15/2018   Procedure: TOTAL HIP ARTHROPLASTY ANTERIOR APPROACH;  Surgeon: Frederik Pear, MD;  Location: [facility];  Service: Orthopedics;  Laterality: Right;     Medications Prior to Admission: Prior to Admission medications   Medication Sig Start Date End Date Taking? Authorizing Provider  amLODipine (NORVASC) 5 MG tablet TAKE 1 TABLET BY MOUTH ONCE DAILY . APPOINTMENT REQUIRED FOR FUTURE REFILLS 04/02/19   Satira Sark, MD  Ascorbic Acid (VITAMIN C PO) Take 1,000 mg by mouth daily.     [provider]  aspirin EC 81 MG tablet Take 81 mg by mouth daily.    [provider]  atorvastatin (LIPITOR) 40 MG tablet Take 40 mg by mouth daily.    [provider]  b complex vitamins tablet Take 1 tablet by mouth daily.    [provider]  Cholecalciferol (VITAMIN D-3) 1000 units CAPS Take 1,000 Units by mouth daily.    [provider]  Cinnamon 500 MG TABS Take 2 tablets by mouth daily.    [provider]  Coenzyme Q10 (COQ-10) 200 MG CAPS Take 200 mg by mouth daily.     [provider]  Glucosamine HCl 1000 MG TABS Take 2 tablets by mouth daily.    [provider]  losartan-hydrochlorothiazide (HYZAAR) 50-12.5 MG tablet Take 0.5  tablets by mouth daily.    [provider]  Lysine 1000 MG TABS Take 1,000 mg by mouth daily.     [provider]  MAGNESIUM PO Take 250 mg by mouth daily.     [provider]  metoprolol tartrate (LOPRESSOR) 50 MG tablet Take 50 mg by mouth daily.    [provider]  Omega-3 Fatty Acids (FISH OIL) 1000 MG CAPS Take by mouth.    [provider]  pantoprazole (PROTONIX) 40 MG tablet Take 1 tablet (40 mg total) by mouth 2 (two) times daily. 08/31/19 08/30/20  Kathie Dike, MD  triamcinolone cream (KENALOG) 0.1 % Apply 1 application topically 2 (two) times daily.    [provider]  Vitamin Mixture (ESTER-C) 500-60 MG TABS Take by mouth.    [provider]  zinc  gluconate 50 MG tablet Take 50 mg by mouth daily.    [provider]    Pt tells me he is only taking lopressor, losartan hctz and Vit C.  Not had ASA since GI bleed. No statin in some time.  Allergies:    Allergies  Allergen Reactions  . Cephalosporins Itching    Social History:   Social History   Socioeconomic History  . Marital status: Married    Spouse name: Not on file  . Number of children: Not on file  . Years of education: Not on file  . Highest education level: Not on file  Occupational History  . Not on file  Tobacco Use  . Smoking status: Never Smoker  . Smokeless tobacco: Never Used  Vaping Use  . Vaping Use: Never used  Substance and Sexual Activity  . Alcohol use: No  . Drug use: No  . Sexual activity: Yes  Other Topics Concern  . Not on file  Social History Narrative  . Not on file   Social Determinants of Health   Financial Resource Strain:   . Difficulty of Paying Living Expenses:   Food Insecurity:   . Worried About Charity fundraiser in the Last Year:   . Arboriculturist in the Last Year:   Transportation Needs:   . Film/video editor (Medical):   Marland Kitchen Lack of Transportation (Non-Medical):   Physical Activity:   .  Days of Exercise per Week:   . Minutes of Exercise per Session:   Stress:   . Feeling of Stress :   Social Connections:   . Frequency of Communication with Friends and Family:   . Frequency of Social Gatherings with Friends and Family:   . Attends Religious Services:   . Active Member of Clubs or Organizations:   . Attends Archivist Meetings:   Marland Kitchen Marital Status:   Intimate Partner Violence:   . Fear of Current or Ex-Partner:   . Emotionally Abused:   Marland Kitchen Physically Abused:   . Sexually Abused:     Family History:   The patient's family history includes Diabetes Mellitus II in his sister; Heart attack in his brother; Pancreatic cancer in his sister; Uterine cancer in his mother.    ROS:  Please see the history of present illness.  General:no colds + fevers (FUO), no weight changes Skin:no rashes or ulcers HEENT:no blurred vision, no congestion CV:see HPI PUL:see HPI GI:no diarrhea + some constipation no melena, no indigestion GU:no hematuria, no dysuria MS:no joint pain, no claudication Neuro:no syncope, no lightheadedness Endo:pre- diabetes, no thyroid disease  All other ROS reviewed and negative.     Physical Exam/Data:   Vitals:   11/03/19 1328  BP: 139/89  Pulse: 81  Resp: 18  Temp: 97.6 F (36.4 C)  TempSrc: Oral  SpO2: 95%   No intake or output data in the 24 hours ending 11/03/19 1532 Last 3 Weights 08/31/2019 08/29/2019 08/07/2019  Weight (lbs) (No Data) 228 lb 239 lb 6.4 oz  Weight (kg) (No Data) 103.42 kg 108.591 kg     There is no height or weight on file to calculate BMI.  General:  Well nourished, well developed, in no acute distress- dyspnic with talking HEENT: normal Lymph: no adenopathy Neck: mild JVD Endocrine:  No thryomegaly Vascular: No carotid bruits; pedal pulses 2+ bilaterally   Cardiac:  normal S1, S2; RRR; no murmur gallup rub or clikc Lungs:  Decreased in bases to auscultation  bilaterally, no wheezing, rhonchi or rales  Abd:  soft, diffuse tenderness, no hepatomegaly  Ext: no lower ext edema Musculoskeletal:  No deformities, BUE and BLE strength normal and equal Skin: warm to cool and dry  Neuro:  Alert and oriented X 3 MAE follows commands, no focal abnormalities noted Psych:  Normal affect    Relevant CV Studies: Echocardiogram 03/08/2019 1. Left ventricular ejection fraction, by visual estimation, is 55 to 60%. The left ventricle has normal function. There is moderately increased left ventricular hypertrophy. 2. Elevated left atrial pressure. 3. Left ventricular diastolic parameters are consistent with Grade I diastolic dysfunction (impaired relaxation). 4. Global right ventricle has normal systolic function.The right ventricular size is normal. No increase in right ventricular wall thickness. 5. Left atrial size was moderately dilated. 6. Right atrial size was normal. 7. Mild aortic valve annular calcification. 8. The mitral valve is abnormal. Moderate mitral valve regurgitation. No evidence of mitral stenosis. 9. There is prolapse of the posterior mitral valve leaflet. The MR jet is eccentric and anterior, causing it to be difficult to evaluate and possibly underestimated. Probable moderate mitral regurgitation. 10. The tricuspid valve is normal in structure. Tricuspid valve regurgitation is not demonstrated. 11. The aortic valve is tricuspid. Aortic valve regurgitation is mild. No evidence of aortic valve sclerosis or stenosis. 12. There is Mild calcification of the aortic valve. 13. There is Mild thickening of the aortic valve. 14. The pulmonic valve was not well visualized. Pulmonic valve regurgitation is mild. 15. Normal pulmonary artery systolic pressure. In comparison to the previous echocardiogram(s): Echocardiogram done 01/04/18 showed an EF of 65% with moderate MR.   Laboratory Data:  High Sensitivity Troponin:  No results for input(s): TROPONINIHS in the last 720 hours.    ChemistryNo  results for input(s): NA, K, CL, CO2, GLUCOSE, BUN, CREATININE, CALCIUM, GFRNONAA, GFRAA, ANIONGAP in the last 168 hours.  No results for input(s): PROT, ALBUMIN, AST, ALT, ALKPHOS, BILITOT in the last 168 hours. HematologyNo results for input(s): WBC, RBC, HGB, HCT, MCV, MCH, MCHC, RDW, PLT in the last 168 hours. BNPNo results for input(s): BNP, PROBNP in the last 168 hours.  DDimer No results for input(s): DDIMER in the last 168 hours.   Radiology/Studies:  No results found.     HEAR Score (for undifferentiated chest pain):     6  New York Heart Association (NYHA) Functional Class NYHA Class II Now on Er in [place] was 3-4  Assessment and Plan:   Chest pain with known multivessel CAD with total occl of LAD, stent to large ramus intermediate vessel, irregularity of the OM1 vessel without high-grade stenosis, and calcification diffusely in the RCA with 20% proximal narrowing and luminal irregularities throughout with 90%  PL stenosis in a small distal vessel.  There is collateralization to the distal to mid LAD via the distal RCA  --troponin in [place] was neg await hs troponin here. --add NTG paste though if pain returns would do IV NTG --not on ASA or Heparin due to gastric ulcer in April on protonix BID since. Will do VTE prophylaxis with subcu heparin and follow CBC - with angian will add back ASA.   --will do nuc study in AM  Acute combined systolic and diastolic HF.  Will place on lasix, 20 bid, he diuresed on 20 mg yesterday. Check echo.   CAD see above no hx of CABG   HTN continue home meds BB will give low dose with his hx RBBB and now intraventricular conductions  delay, and losartan hold HCTZ with lasix  Hx of ICM on last echo EF improved to 50-55% with mild MR, with new CHF will recheck echo.  HLD stopped statin due to side effects. Will resume for now  Anemia followed by GI as outpt  Gastric ulcer in April and has been off ASA since.  abd pain, post prandial and hx of  diverticulosis on choloscopy in 2019.    FUO being worked up may need IM to assist   Leukocytosis with WBC of 15 - waiting for f/u labs. U/a was clear today back to normal.  Severity of Illness: The appropriate patient status for this patient is INPATIENT. Inpatient status is judged to be reasonable and necessary in order to provide the required intensity of service to ensure the patient's safety. The patient's presenting symptoms, physical exam findings, and initial radiographic and laboratory data in the context of their chronic comorbidities is felt to place them at high risk for further clinical deterioration. Furthermore, it is not anticipated that the patient will be medically stable for discharge from the hospital within 2 midnights of admission. The following factors support the patient status of inpatient.   " The patient's presenting symptoms include SOB and chest tightness. " The worrisome physical exam findings include decreased breath sounds, due to fluid. " The initial radiographic and laboratory data are worrisome because of CHF, neg MI but awaiting more labs.. " The chronic co-morbidities include CAD, HTN, hx of ICM, anemia and FUO., HLD.   * I certify that at the point of admission it is my clinical judgment that the patient will require inpatient hospital care spanning beyond 2 midnights from the point of admission due to high intensity of service, high risk for further deterioration and high frequency of surveillance required.*    For questions or updates, please contact [place] Please consult [email or web address] for contact info under     Signed, Cecilie Kicks, NP  11/03/2019 3:32 PM   I have seen, examined the patient, and reviewed the above assessment and plan.  Changes to above are made where necessary.  On exam, RRR.  We will admit for diuresis, echo and further CV risk stratification.  If no further chest pain, I would advise myoview.  If he has worsening symptoms  then we will consider cath. Given fatigue, lethargy, anemia and fevers, we will ask medicine to assist with management.  Co Sign: Thompson Grayer, MD 11/03/2019 4:58 PM

## 2019-11-03 NOTE — Consult Note (Signed)
Medical Consultation   Christopher Nelson  VOJ:[id number]  DOB: 1940-08-14  DOA: 11/03/2019  PCP: Christopher Percy, MD   Outpatient Specialists: Dr.  Earl Lites [facility]emat[facility])  Requesting physician: Dr. Rayann Nelson  Reason for consultation:  Recent fevers   History of Present Illness: Christopher Nelson is an 79 y.o. male with a hx of HTN, GERD, CAD s/p CABG in 2020 who was transferred from [facility] for work up of chest pains. Pt has been following with [facility]emat[facility] for chronic anemia, last seen on 6/24. Over past several weeks, pt has been experiencing sweats, chills and fevers after dinner associated with tightness and bloating. Work up included, 5-HIAA, ani-centromere ab, ANCA, anti-DNA, RF, ANA, cortisol, metanephrines, B12, SPEP, gastrin which were found to be unremarkable. Initial blood work at [facility] notable for presenting WBC of 15.7 with hgb of 9.5, CXR with central vascular congestion and mild interstitial opacity suggestive of edema with neg abd xray. UA was also unremarkable. Upon transfer to [facility], repeat WBC noted to be 9.7 with temp of 97.76F. Given reported fevers prior to admit, hospitalist consulted for further evaluation  On further questioning, fevers seem to correspond to after dinner, associated with abd distension, resolved with tylenol  Pt tested neg for COVID as well as other respiratory viruses prior to transfer, results confirmed on Care Everywhere. Pt is s/p COVID vaccination  Review of Systems:  Review of Systems  Constitutional: Positive for chills and fever.  HENT: Negative for ear pain and nosebleeds.   Eyes: Negative for photophobia, pain and discharge.  Respiratory: Negative for hemoptysis and sputum production.   Cardiovascular: Positive for chest pain and leg swelling. Negative for orthopnea.  Gastrointestinal: Negative for constipation, nausea and vomiting.  Genitourinary: Negative for frequency and hematuria.    Musculoskeletal: Negative for back pain, joint pain and neck pain.  Neurological: Negative for tingling, tremors, focal weakness and seizures.  Psychiatric/Behavioral: Negative for hallucinations and memory loss. The patient is not nervous/anxious.    As per HPI otherwise 10 point review of systems negative.   Past Medical History: Past Medical History:  Diagnosis Date  . Arthritis   . CKD (chronic kidney disease), stage III   . Coronary artery disease    a. unstable angina s/p cath 01/2017 -  specifically DES to a large branching ramus intermedius. Residual disease includes ostial occlusion of the LAD with right-to-left collaterals, also 90% posterolateral stenosis. EF 35-40% by cath but then 55-60% by echo.  . Essential hypertension   . GERD (gastroesophageal reflux disease)   . Gout   . History of kidney stones   . Ischemic cardiomyopathy    a. EF 35-40% by cath then 55-60% by echo shortly after in 01/2017.  . Mitral valve disease    a. 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  . Pre-diabetes     Past Surgical History: Past Surgical History:  Procedure Laterality Date  . BIOPSY  08/31/2019   Procedure: BIOPSY;  Surgeon: Christopher Binder, MD;  Location: [facility];  Service: Endoscopy;;  duodenum gastric  . CARDIAC CATHETERIZATION  01/06/2017  . cataract s Left 04/2017  . CHOLECYSTECTOMY    . COLONOSCOPY WITH ESOPHAGOGASTRODUODENOSCOPY (EGD)  06/2017   [facility]: erosive gastritis, evidence of prior gastric ulcers (scarring), no h.pylori. pancolonic diverticulosis (suspected source for his bleeding), internal hemorrhoids  .  CORONARY STENT INTERVENTION  01/06/2017   CORONARY STENT INTERVENTION  . CORONARY STENT INTERVENTION N/A 01/06/2017   Procedure: CORONARY STENT INTERVENTION;  Surgeon: Troy Sine, MD;  Location: [place] [facility];  Service: Cardiovascular;  Laterality: N/A;  .  ESOPHAGOGASTRODUODENOSCOPY N/A 08/31/2019   Procedure: ESOPHAGOGASTRODUODENOSCOPY (EGD);  Surgeon: Christopher Binder, MD;  Location: [facility];  Service: Endoscopy;  Laterality: N/A;  . LEFT HEART CATH AND CORONARY ANGIOGRAPHY N/A 01/06/2017   Procedure: LEFT HEART CATH AND CORONARY ANGIOGRAPHY;  Surgeon: Troy Sine, MD;  Location: [place] [facility];  Service: Cardiovascular;  Laterality: N/A;  . LITHOTRIPSY    . TONSILLECTOMY    . TOTAL HIP ARTHROPLASTY Right 05/15/2018   Procedure: TOTAL HIP ARTHROPLASTY ANTERIOR APPROACH;  Surgeon: Frederik Pear, MD;  Location: [facility];  Service: Orthopedics;  Laterality: Right;    Allergies:   Allergies  Allergen Reactions  . Cephalosporins Itching    Social History:  reports that he has never smoked. He has never used smokeless tobacco. He reports that he does not drink alcohol and does not use drugs.  Family History: Family History  Problem Relation Age of Onset  . Uterine cancer Mother   . Diabetes Mellitus II Sister   . Heart attack Brother        Age 22  . Pancreatic cancer Sister     Physical Exam: Vitals:   11/03/19 1328 11/03/19 1725  BP: 139/89 (!) 153/79  Pulse: 81 96  Resp: 18   Temp: 97.6 F (36.4 C)   TempSrc: Oral   SpO2: 95% 95%  Weight: 104.7 kg   Height: 6\' 2"  (1.88 m)     Constitutional: Appearance,  Alert and awake, oriented x3, not in any acute distress. Eyes: PERLA, EOMI, irises appear normal, anicteric sclera,  ENMT: external ears and nose appear normal, normal hearing or hard of hearing            Lips appears normal, oropharynx mucosa, tongue, posterior pharynx appear normal  Neck: neck appears normal, no masses, normal ROM, no thyromegaly, no JVD  CVS: S1-S2 clear, BLE edema  Respiratory:  clear to auscultation bilaterally, no wheezing, rales or rhonchi. Respiratory effort normal. No accessory muscle use.  Abdomen: soft nontender, nondistended, normal bowel sounds, no hepatosplenomegaly, no hernias    Musculoskeletal: : no cyanosis, clubbing or edema noted bilaterally                       Joint/bones/muscle exam, strength, contractures or atrophy Neuro: Cranial nerves II-XII intact, strength, sensation, reflexes Psych: judgement and insight appear normal, stable mood and affect, mental status Skin: no rashes or lesions or ulcers, no induration or nodules   Data reviewed:  I have personally reviewed following labs and imaging studies Labs:  CBC: Recent Labs  Lab 11/03/19 1506  WBC 9.7  HGB 8.8*  HCT 30.1*  MCV 82.5  PLT 030    Basic Metabolic Panel: Recent Labs  Lab 11/03/19 1506  NA 138  K 3.8  CL 103  CO2 28  GLUCOSE 149*  BUN 23  CREATININE 1.44*  CALCIUM 8.5*   GFR Estimated Creatinine Clearance: 54.5 mL/min (A) (by C-G formula based on SCr of 1.44 mg/dL (H)). Liver Function Tests: No results for input(s): AST, ALT, ALKPHOS, BILITOT, PROT, ALBUMIN in the last 168 hours. No results for input(s): LIPASE, AMYLASE in the last 168 hours. No results for input(s): AMMONIA in the last 168 hours. Coagulation  profile No results for input(s): INR, PROTIME in the last 168 hours.  Cardiac Enzymes: No results for input(s): CKTOTAL, CKMB, CKMBINDEX, TROPONINI in the last 168 hours. BNP: Invalid input(s): POCBNP CBG: No results for input(s): GLUCAP in the last 168 hours. D-Dimer No results for input(s): DDIMER in the last 72 hours. Hgb A1c No results for input(s): HGBA1C in the last 72 hours. Lipid Profile No results for input(s): CHOL, HDL, LDLCALC, TRIG, CHOLHDL, LDLDIRECT in the last 72 hours. Thyroid function studies No results for input(s): TSH, T4TOTAL, T3FREE, THYROIDAB in the last 72 hours.  Invalid input(s): FREET3 Anemia work up No results for input(s): VITAMINB12, FOLATE, FERRITIN, TIBC, IRON, RETICCTPCT in the last 72 hours. Urinalysis    Component Value Date/Time   COLORURINE YELLOW 08/29/2019 2258   APPEARANCEUR CLEAR 08/29/2019 2258   LABSPEC  1.018 08/29/2019 2258   PHURINE 5.0 08/29/2019 2258   GLUCOSEU NEGATIVE 08/29/2019 2258   HGBUR NEGATIVE 08/29/2019 2258   [place] 08/29/2019 2258   KETONESUR NEGATIVE 08/29/2019 2258   PROTEINUR 30 (A) 08/29/2019 2258   NITRITE NEGATIVE 08/29/2019 2258   LEUKOCYTESUR NEGATIVE 08/29/2019 2258    Microbiology No results found for this or any previous visit (from the past 240 hour(s)).  Inpatient Medications:   Scheduled Meds: . amLODipine  5 mg Oral Daily  . vitamin C  1,000 mg Oral Daily  . aspirin EC  81 mg Oral Daily  . [START ON 11/04/2019] atorvastatin  40 mg Oral Daily  . furosemide  20 mg Intravenous BID  . heparin  5,000 Units Subcutaneous Q8H  . losartan  25 mg Oral Daily  . metoprolol tartrate  12.5 mg Oral BID  . pantoprazole  40 mg Oral BID  . potassium chloride  20 mEq Oral BID  . sodium chloride flush  3 mL Intravenous Q12H   Continuous Infusions: . sodium chloride       Radiological Exams on Admission: No results found.  Impression/Recommendations Active Problems:   Anemia   Chest pain of uncertain etiology   Acute on chronic combined systolic and diastolic CHF (congestive heart failure) (HCC)   CAD in native artery   Acute CHF (congestive heart failure) ([place])  1. Fevers -Presently afebrile with no leukocytosis -No evidence of acute infection at this time -If pt becomes febrile, would recommend infection work up including blood cx x2, repeat UA with urine cx -Would also recommend abd CT if pt becomes febrile -COVID as well as respiratory viruses were neg prior to transfer -Will check HIV -Repeat CBC with diff in AM  2. Chest pain -Continue per Cardiology service -Plans to diurese, 2d echo and possible stress test. If worse symptoms, then cath  3. Acute on chronic combined systolic and diastolic chf -Defer to Cardiology. Plans for diuresis noted  4. HTN -BP stable at present  5. Anemia - Seems to be stable -Followed by  Hematology at [facility] -Chart reviewed. Pt has hx of diverticulosis, gastric ulcers and erosions, mild inflammation in duodenal bulb on recent EGD in 4/21 -Would continue PPI  Thank you for this consultation.  Our [facility] hospitalist team will follow the patient with you.   Marylu Lund M.D. [facility] 11/03/2019, 5:38 PM

## 2019-11-04 ENCOUNTER — Other Ambulatory Visit (HOSPITAL_COMMUNITY): Payer: Medicare Other

## 2019-11-04 ENCOUNTER — Inpatient Hospital Stay (HOSPITAL_COMMUNITY): Payer: Medicare Other

## 2019-11-04 DIAGNOSIS — N1832 Chronic kidney disease, stage 3b: Secondary | ICD-10-CM

## 2019-11-04 DIAGNOSIS — R140 Abdominal distension (gaseous): Secondary | ICD-10-CM

## 2019-11-04 DIAGNOSIS — I340 Nonrheumatic mitral (valve) insufficiency: Secondary | ICD-10-CM

## 2019-11-04 LAB — CBC WITH DIFFERENTIAL/PLATELET
Abs Immature Granulocytes: 0.08 10*3/uL — ABNORMAL HIGH (ref 0.00–0.07)
Basophils Absolute: 0.1 10*3/uL (ref 0.0–0.1)
Basophils Relative: 1 %
Eosinophils Absolute: 0.4 10*3/uL (ref 0.0–0.5)
Eosinophils Relative: 3 %
HCT: 29.4 % — ABNORMAL LOW (ref 39.0–52.0)
Hemoglobin: 8.5 g/dL — ABNORMAL LOW (ref 13.0–17.0)
Immature Granulocytes: 1 %
Lymphocytes Relative: 11 %
Lymphs Abs: 1.3 10*3/uL (ref 0.7–4.0)
MCH: 23.7 pg — ABNORMAL LOW (ref 26.0–34.0)
MCHC: 28.9 g/dL — ABNORMAL LOW (ref 30.0–36.0)
MCV: 82.1 fL (ref 80.0–100.0)
Monocytes Absolute: 1 10*3/uL (ref 0.1–1.0)
Monocytes Relative: 9 %
Neutro Abs: 8.7 10*3/uL — ABNORMAL HIGH (ref 1.7–7.7)
Neutrophils Relative %: 75 %
Platelets: 253 10*3/uL (ref 150–400)
RBC: 3.58 MIL/uL — ABNORMAL LOW (ref 4.22–5.81)
RDW: 14.9 % (ref 11.5–15.5)
WBC: 11.6 10*3/uL — ABNORMAL HIGH (ref 4.0–10.5)
nRBC: 0 % (ref 0.0–0.2)

## 2019-11-04 LAB — HEPATIC FUNCTION PANEL
ALT: 18 U/L (ref 0–44)
AST: 15 U/L (ref 15–41)
Albumin: 3 g/dL — ABNORMAL LOW (ref 3.5–5.0)
Alkaline Phosphatase: 54 U/L (ref 38–126)
Bilirubin, Direct: <0.1 mg/dL (ref 0.0–0.2)
Total Bilirubin: 0.6 mg/dL (ref 0.3–1.2)
Total Protein: 7.1 g/dL (ref 6.5–8.1)

## 2019-11-04 LAB — FERRITIN: Ferritin: 37 ng/mL (ref 24–336)

## 2019-11-04 LAB — BASIC METABOLIC PANEL
Anion gap: 7 (ref 5–15)
BUN: 22 mg/dL (ref 8–23)
CO2: 28 mmol/L (ref 22–32)
Calcium: 8.4 mg/dL — ABNORMAL LOW (ref 8.9–10.3)
Chloride: 102 mmol/L (ref 98–111)
Creatinine, Ser: 1.41 mg/dL — ABNORMAL HIGH (ref 0.61–1.24)
GFR calc Af Amer: 55 mL/min — ABNORMAL LOW (ref >60)
GFR calc non Af Amer: 47 mL/min — ABNORMAL LOW (ref >60)
Glucose, Bld: 164 mg/dL — ABNORMAL HIGH (ref 70–99)
Potassium: 4.6 mmol/L (ref 3.5–5.1)
Sodium: 137 mmol/L (ref 135–145)

## 2019-11-04 LAB — ECHOCARDIOGRAM COMPLETE
Height: 74 in
Weight: 3684.8 oz

## 2019-11-04 LAB — TSH: TSH: 0.614 u[IU]/mL (ref 0.350–4.500)

## 2019-11-04 LAB — IRON AND TIBC
Iron: 18 ug/dL — ABNORMAL LOW (ref 45–182)
Saturation Ratios: 7 % — ABNORMAL LOW (ref 17.9–39.5)
TIBC: 258 ug/dL (ref 250–450)
UIBC: 240 ug/dL

## 2019-11-04 LAB — GLUCOSE, CAPILLARY
Glucose-Capillary: 229 mg/dL — ABNORMAL HIGH (ref 70–99)
Glucose-Capillary: 146 mg/dL — ABNORMAL HIGH (ref 70–99)

## 2019-11-04 LAB — HEMOGLOBIN A1C
Hgb A1c MFr Bld: 7.9 % — ABNORMAL HIGH (ref 4.8–5.6)
Mean Plasma Glucose: 180.03 mg/dL

## 2019-11-04 MED ORDER — MAGNESIUM HYDROXIDE 400 MG/5ML PO SUSP
30.0000 mL | Freq: Every day | ORAL | Status: DC | PRN
  Administered 2019-11-04: 30 mL via ORAL
  Filled 2019-11-04: qty 30

## 2019-11-04 MED ORDER — INSULIN ASPART 100 UNIT/ML ~~LOC~~ SOLN
0.0000 [IU] | Freq: Three times a day (TID) | SUBCUTANEOUS | Status: DC
  Administered 2019-11-04: 3 [IU] via SUBCUTANEOUS
  Administered 2019-11-05: 1 [IU] via SUBCUTANEOUS
  Administered 2019-11-05 (×2): 2 [IU] via SUBCUTANEOUS
  Administered 2019-11-06 – 2019-11-08 (×2): 3 [IU] via SUBCUTANEOUS

## 2019-11-04 MED ORDER — SENNOSIDES-DOCUSATE SODIUM 8.6-50 MG PO TABS
1.0000 | ORAL_TABLET | Freq: Two times a day (BID) | ORAL | Status: DC
  Administered 2019-11-04 – 2019-11-08 (×4): 1 via ORAL
  Filled 2019-11-04 (×4): qty 1

## 2019-11-04 MED ORDER — FERROUS SULFATE 325 (65 FE) MG PO TABS
325.0000 mg | ORAL_TABLET | Freq: Every day | ORAL | Status: DC
  Administered 2019-11-05 – 2019-11-08 (×4): 325 mg via ORAL
  Filled 2019-11-04 (×4): qty 1

## 2019-11-04 MED ORDER — POLYETHYLENE GLYCOL 3350 17 G PO PACK
17.0000 g | PACK | Freq: Two times a day (BID) | ORAL | Status: DC
  Administered 2019-11-04 – 2019-11-05 (×3): 17 g via ORAL
  Filled 2019-11-04 (×4): qty 1

## 2019-11-04 NOTE — Progress Notes (Addendum)
PROGRESS NOTE    Christopher Nelson  CBS:[id number] DOB: [birth year] DOA: 11/03/2019 PCP: Rory Percy, MD   Brief Narrative: 79 year old with past medical history significant for hypertension, GERD, CAD status post CABG 2020 who was transferred from [facility] for work-up of chest pain.  Patient has been following with [facility] hematology for chronic anemia, last seen on 6/24.  Over past several weeks patient has been experiencing sweats, chills and fever after dinner associated with tightness and bloating.  Work-up included: 5 HIAA, anticentromere antibody, ANCA, anti DNA, RF, ANA, cortisol, metanephrines, B12, SPEP, gastrin which were found to be unremarkable.  Initial white blood cell at [facility] was elevated at 15, hemoglobin 9.5, chest x-ray with central vascular congestion and mild interstitial opacity suggesting edema.  UA was also unremarkable.  On arrival to [facility] white blood cell was noted to be at 9.7.  Afebrile.  Covid test: Negative   Assessment & Plan:   Active Problems:   Anemia   Chest pain of uncertain etiology   Acute on chronic combined systolic and diastolic CHF (congestive heart failure) (HCC)   CAD in native artery   Acute CHF (congestive heart failure) (HCC)  1-Acute systolic heart failure exacerbation: History of ischemic cardiomyopathy with prior ejection fraction 55% Patient presents with shortness of breath, chest tightness, elevated BNP, chest x-ray with pulmonary congestion. Continue with IV Lasix. Cardiology already following. Echo: Ejection fraction 25 to 30%, right ventricular function reduced as well, moderate to severe mitral valve rehabilitation -1.5 L weight: 230--230  2-Abdominal distention: He had a CT abdomen and pelvis April 2021: Colonic diverticulosis without evidence of diverticulitis.  Prior cholecystectomy, atrophic left kidney with a small bilateral renal cyst, moderate to marked severity enlargement of prostate gland.  Left adrenal mass  which may represent adrenal adenoma. -Right upper quadrant ultrasound; prior cholecystectomy, right pleural effusion, no evidence of ascites, bile duct 2.3 mm -Bowel regimen. -If no improvement by tomorrow after bowel movement will proceed with CT abdomen and pelvis.   Left adrenal mass: Need follow-up.  Fevers: Patient reports fever for the last several weeks on and off. He has remained afebrile in the hospital. HIV negative Liver function test normal He had a chest x-ray and UA performed at [facility] counting negative for sign of infection. Will get blood cultures if patient spikes fever  Iron deficiency anemia: Patient had endoscopy 4/21 showed gastric ulcers and erosion with mild inflammation in the duodenal bulb Continue with PPI We will start iron.  Hypertension: Hold Norvasc due to low ejection fraction Continue with metoprolol, Cozaar, Lasix.  CKD stage IIIb, creatinine baseline 1.3--1.4 Monitor renal function.  Diabetes; history of pre diabetic. Diabetes educator.  SSI Estimated body mass index is 29.57 kg/m as calculated from the following:   Height as of this encounter: 6\' 2"  (1.88 m).   Weight as of this encounter: 104.5 kg.   DVT prophylaxis: Heparin Code Status: Full code Family Communication: Discussed with patient Disposition Plan:  Status is: Inpatient  Remains inpatient appropriate because:IV treatments appropriate due to intensity of illness or inability to take PO   Dispo: The patient is from: Home              Anticipated d/c is to: Home              Anticipated d/c date is: 2 days              Patient currently is not medically stable to d/c.  Is  currently requiring IV lasix.         Consultants:   Cardiology Primary   Procedures:   US  ECHO; Left ventricular ejection fraction, by estimation, is 25 to 30%. The  left ventricle has severely decreased function. The left ventricle  demonstrates regional wall motion abnormalities  (see scoring  diagram/findings for description). There is mild left  ventricular hypertrophy. Left ventricular diastolic parameters are  indeterminate.  2. Right ventricular systolic function reduced. The right ventricular  size is normal. Tricuspid regurgitation signal is inadequate for assessing  PA pressure.  3. Left atrial size was severely dilated.  4. The mitral valve is abnormal, restricted posterior leaflet motion,  mild annular calcification. Moderate to severe mitral valve regurgitation,  eccentric and two jets noted.  5. The aortic valve is tricuspid. Aortic valve regurgitation is trivial.  Mild to moderate aortic valve sclerosis/calcification is present, without  any evidence of aortic stenosis.  6. Unable to estimate CVP.   Antimicrobials:  none  Subjective: No fever overnight. Report night sweat last night.  He denies dysuria, no BM in 4 days. Feels abdomen more distended.  Report occasional cough.   Objective: Vitals:   11/04/19 0325 11/04/19 1051 11/04/19 1053 11/04/19 1120  BP: (!) 145/83 (!) 149/90 (!) 149/90 (!) 138/95  Pulse: 100  95 97  Resp: 18   16  Temp: 97.7 F (36.5 C)   98.9 F (37.2 C)  TempSrc: Oral   Oral  SpO2: 97%   98%  Weight: 104.5 kg     Height:        Intake/Output Summary (Last 24 hours) at 11/04/2019 1538 Last data filed at 11/04/2019 1400 Gross per 24 hour  Intake 483 ml  Output 2025 ml  Net -1542 ml   Filed Weights   11/03/19 1328 11/04/19 0325  Weight: 104.7 kg 104.5 kg    Examination:  General exam: Appears calm and comfortable  Respiratory system: B/L crackles. Marland Kitchen Respiratory effort normal. Cardiovascular system: S1 & S2 heard, RRR. No JVD, murmurs, rubs, gallops or clicks. No pedal edema. Gastrointestinal system: Abdomen is distended, soft and nontender. No organomegaly or masses felt. Normal bowel sounds heard. Central nervous system: Alert and oriented. No focal neurological deficits. Extremities: Symmetric 5 x  5 power. Skin: No rashes, lesions or ulcers   Data Reviewed: I have personally reviewed following labs and imaging studies  CBC: Recent Labs  Lab 11/03/19 1506 11/04/19 0317  WBC 9.7 11.6*  NEUTROABS  --  8.7*  HGB 8.8* 8.5*  HCT 30.1* 29.4*  MCV 82.5 82.1  PLT 287 250   Basic Metabolic Panel: Recent Labs  Lab 11/03/19 1506 11/03/19 1841 11/04/19 0317  NA 138  --  137  K 3.8  --  4.6  CL 103  --  102  CO2 28  --  28  GLUCOSE 149*  --  164*  BUN 23  --  22  CREATININE 1.44* 1.52* 1.41*  CALCIUM 8.5*  --  8.4*   GFR: Estimated Creatinine Clearance: 55.6 mL/min (A) (by C-G formula based on SCr of 1.41 mg/dL (H)). Liver Function Tests: Recent Labs  Lab 11/04/19 0757  AST 15  ALT 18  ALKPHOS 54  BILITOT 0.6  PROT 7.1  ALBUMIN 3.0*   No results for input(s): LIPASE, AMYLASE in the last 168 hours. No results for input(s): AMMONIA in the last 168 hours. Coagulation Profile: No results for input(s): INR, PROTIME in the last 168 hours. Cardiac Enzymes: No  results for input(s): CKTOTAL, CKMB, CKMBINDEX, TROPONINI in the last 168 hours. BNP (last 3 results) No results for input(s): PROBNP in the last 8760 hours. HbA1C: Recent Labs    11/04/19 0317  HGBA1C 7.9*   CBG: No results for input(s): GLUCAP in the last 168 hours. Lipid Profile: No results for input(s): CHOL, HDL, LDLCALC, TRIG, CHOLHDL, LDLDIRECT in the last 72 hours. Thyroid Function Tests: Recent Labs    11/04/19 0757  TSH 0.614   Anemia Panel: Recent Labs    11/04/19 1101  FERRITIN 37  TIBC 258  IRON 18*   Sepsis Labs: No results for input(s): PROCALCITON, LATICACIDVEN in the last 168 hours.  No results found for this or any previous visit (from the past 240 hour(s)).       Radiology Studies: US Abdomen Limited  Result Date: 11/04/2019 CLINICAL DATA:  Assess right upper quadrant and abdomen for ascites. History of prior cholecystectomy. EXAM: ULTRASOUND ABDOMEN LIMITED RIGHT  UPPER QUADRANT COMPARISON:  None. FINDINGS: Gallbladder: The gallbladder is surgically absent. Common bile duct: Diameter: 2.3 mm Liver: No focal lesion identified. Within normal limits in parenchymal echogenicity. Portal vein is patent on color Doppler imaging with normal direction of blood flow towards the liver. Other: No intra-abdominal free fluid is seen. A right pleural effusion is noted. IMPRESSION: 1. Findings consistent with prior cholecystectomy. 2. Right pleural effusion. 3. No evidence of ascites. Electronically Signed   By: Virgina Norfolk M.D.   On: 11/04/2019 15:22   ECHOCARDIOGRAM COMPLETE  Result Date: 11/04/2019    ECHOCARDIOGRAM REPORT   Patient Name:   Christopher Nelson Date of Exam: 11/04/2019 Medical Rec #:  [id number]        Height:       74.0 in Accession #:    [id number]       Weight:       230.3 lb Date of Birth:  1941-01-04       BSA:          2.308 m Patient Age:    72 years         BP:           138/95 mmHg Patient Gender: M                HR:           89 bpm. Exam Location:  Inpatient Procedure: 2D Echo, Cardiac Doppler and Color Doppler Indications:    Chest pain  History:        Patient has prior history of Echocardiogram examinations, most                 recent 03/08/2019. CHF, CAD, Mitral Valve Disease; Risk                 Factors:Dyslipidemia. CKD.  Sonographer:    Clayton Lefort RDCS (AE) Referring Phys: [place] Comments: Suboptimal subcostal window. IMPRESSIONS  1. Left ventricular ejection fraction, by estimation, is 25 to 30%. The left ventricle has severely decreased function. The left ventricle demonstrates regional wall motion abnormalities (see scoring diagram/findings for description). There is mild left  ventricular hypertrophy. Left ventricular diastolic parameters are indeterminate.  2. Right ventricular systolic function reduced. The right ventricular size is normal. Tricuspid regurgitation signal is inadequate for assessing PA pressure.  3.  Left atrial size was severely dilated.  4. The mitral valve is abnormal, restricted posterior leaflet motion, mild annular calcification. Moderate to severe mitral valve regurgitation,  eccentric and two jets noted.  5. The aortic valve is tricuspid. Aortic valve regurgitation is trivial. Mild to moderate aortic valve sclerosis/calcification is present, without any evidence of aortic stenosis.  6. Unable to estimate CVP. FINDINGS  Left Ventricle: Left ventricular ejection fraction, by estimation, is 25 to 30%. The left ventricle has severely decreased function. The left ventricle demonstrates regional wall motion abnormalities. The left ventricular internal cavity size was normal  in size. There is mild left ventricular hypertrophy. Left ventricular diastolic parameters are indeterminate.  LV Wall Scoring: The mid and distal anterior septum, mid inferolateral segment, mid inferoseptal segment, apical inferior segment, and apex are akinetic. The entire anterior wall, inferior wall, basal inferolateral segment, apical lateral segment, and mid anterolateral segment are hypokinetic. The basal anteroseptal segment, basal anterolateral segment, and basal inferoseptal segment are normal. Right Ventricle: The right ventricular size is normal. No increase in right ventricular wall thickness. Right ventricular systolic function reduced. Tricuspid regurgitation signal is inadequate for assessing PA pressure. Left Atrium: Left atrial size was severely dilated. Right Atrium: Right atrial size was normal in size. Pericardium: There is no evidence of pericardial effusion. Mitral Valve: The mitral valve is abnormal. Mild mitral annular calcification. Moderate to severe mitral valve regurgitation. Tricuspid Valve: The tricuspid valve is grossly normal. Tricuspid valve regurgitation is trivial. Aortic Valve: The aortic valve is tricuspid. Aortic valve regurgitation is trivial. Mild to moderate aortic valve sclerosis/calcification is  present, without any evidence of aortic stenosis. Mild aortic valve annular calcification. Aortic valve mean gradient measures 2.7 mmHg. Aortic valve peak gradient measures 4.3 mmHg. Aortic valve area, by VTI measures 3.57 cm. Pulmonic Valve: The pulmonic valve was grossly normal. Pulmonic valve regurgitation is trivial. Aorta: The aortic root is normal in size and structure. Venous: Unable to assess CVP. The inferior vena cava was not well visualized. IAS/Shunts: No atrial level shunt detected by color flow Doppler.  LEFT VENTRICLE PLAX 2D LVIDd:         5.10 cm LVIDs:         4.40 cm LV PW:         2.60 cm LV IVS:        1.70 cm LVOT diam:     2.50 cm LV SV:         62 LV SV Index:   27 LVOT Area:     4.91 cm  LV Volumes (MOD) LV vol d, MOD A2C: 267.0 ml LV vol d, MOD A4C: 190.0 ml LV vol s, MOD A2C: 169.0 ml LV vol s, MOD A4C: 119.0 ml LV SV MOD A2C:     98.0 ml LV SV MOD A4C:     190.0 ml LV SV MOD BP:      82.5 ml RIGHT VENTRICLE RV Basal diam:  3.80 cm RV Mid diam:    3.70 cm RV S prime:     13.90 cm/s TAPSE (M-mode): 2.6 cm LEFT ATRIUM              Index       RIGHT ATRIUM           Index LA diam:        5.10 cm  2.21 cm/m  RA Area:     20.20 cm LA Vol (A2C):   130.0 ml 56.33 ml/m RA Volume:   71.90 ml  31.16 ml/m LA Vol (A4C):   114.0 ml 49.40 ml/m LA Biplane Vol: 123.0 ml 53.30 ml/m  AORTIC VALVE AV Area (Vmax):  3.23 cm AV Area (Vmean):   3.05 cm AV Area (VTI):     3.57 cm AV Vmax:           103.73 cm/s AV Vmean:          72.067 cm/s AV VTI:            0.173 m AV Peak Grad:      4.3 mmHg AV Mean Grad:      2.7 mmHg LVOT Vmax:         68.30 cm/s LVOT Vmean:        44.733 cm/s LVOT VTI:          0.126 m LVOT/AV VTI ratio: 0.73  AORTA Ao Asc diam: 3.60 cm MR Peak grad:    101.2 mmHg MR Mean grad:    57.0 mmHg   SHUNTS MR Vmax:         503.00 cm/s Systemic VTI:  0.13 m MR Vmean:        345.0 cm/s  Systemic Diam: 2.50 cm MR PISA:         4.02 cm MR PISA Eff ROA: 32 mm MR PISA Radius:  0.80 cm  Rozann Lesches MD Electronically signed by Rozann Lesches MD Signature Date/Time: 11/04/2019/2:42:20 PM    Final         Scheduled Meds: . amLODipine  5 mg Oral Daily  . vitamin C  1,000 mg Oral Daily  . aspirin EC  81 mg Oral Daily  . atorvastatin  40 mg Oral Daily  . furosemide  20 mg Intravenous BID  . heparin  5,000 Units Subcutaneous Q8H  . losartan  25 mg Oral Daily  . metoprolol tartrate  12.5 mg Oral BID  . pantoprazole  40 mg Oral BID  . polyethylene glycol  17 g Oral BID  . potassium chloride  20 mEq Oral BID  . senna-docusate  1 tablet Oral BID  . sodium chloride flush  3 mL Intravenous Q12H   Continuous Infusions: . sodium chloride       LOS: 1 day    Time spent: 35 minutes.     Elmarie Shiley, MD [facility]   If 7PM-7AM, please contact night-coverage [email or web address]  11/04/2019, 3:38 PM

## 2019-11-04 NOTE — Progress Notes (Signed)
  Echocardiogram 2D Echocardiogram has been performed.  Christopher Nelson 11/04/2019, 1:50 PM

## 2019-11-04 NOTE — Progress Notes (Signed)
Progress Note  Patient Name: Christopher Nelson Date of Encounter: 11/04/2019  Primary Cardiologist: Rozann Lesches, MD  Subjective   Feels a sense of tightness in his chest, but also fullness in his abdomen, has not moved his bowels. Shortness of breath better with supplemental oxygen. Did not have any fevers last night.  Inpatient Medications    Scheduled Meds: . amLODipine  5 mg Oral Daily  . vitamin C  1,000 mg Oral Daily  . aspirin EC  81 mg Oral Daily  . atorvastatin  40 mg Oral Daily  . furosemide  20 mg Intravenous BID  . heparin  5,000 Units Subcutaneous Q8H  . losartan  25 mg Oral Daily  . metoprolol tartrate  12.5 mg Oral BID  . pantoprazole  40 mg Oral BID  . potassium chloride  20 mEq Oral BID  . sodium chloride flush  3 mL Intravenous Q12H   Continuous Infusions: . sodium chloride     PRN Meds: sodium chloride, acetaminophen, ALPRAZolam, magnesium hydroxide, ondansetron (ZOFRAN) IV, sodium chloride flush, zolpidem   Vital Signs    Vitals:   11/03/19 1928 11/03/19 2008 11/04/19 0022 11/04/19 0325  BP: (!) 180/99 133/63 (!) 112/55 (!) 145/83  Pulse:  91 88 100  Resp:  '20 19 18  ' Temp:  97.7 F (36.5 C) 98.4 F (36.9 C) 97.7 F (36.5 C)  TempSrc:  Oral Oral Oral  SpO2:  91% 93% 97%  Weight:    104.5 kg  Height:        Intake/Output Summary (Last 24 hours) at 11/04/2019 0940 Last data filed at 11/04/2019 0700 Gross per 24 hour  Intake 483 ml  Output 900 ml  Net -417 ml   Filed Weights   11/03/19 1328 11/04/19 0325  Weight: 104.7 kg 104.5 kg    Telemetry    Sinus rhythm with PVCs. Personally reviewed.  ECG    An ECG dated 11/03/2019 was personally reviewed today and demonstrated:  Sinus rhythm with IVCD, leftward axis, increased voltage.  Physical Exam   GEN:  Elderly male. No acute distress.   Neck: No JVD. Cardiac: RRR, cannot exclude S3. No rub. Respiratory:  Clear, nonlabored. GI:  Protuberant, bowel sounds present. MS: No edema; No  deformity. Neuro:  Nonfocal. Psych: Alert and oriented x 3. Normal affect.  Labs    Chemistry Recent Labs  Lab 11/03/19 1506 11/03/19 1841 11/04/19 0317 11/04/19 0757  NA 138  --  137  --   K 3.8  --  4.6  --   CL 103  --  102  --   CO2 28  --  28  --   GLUCOSE 149*  --  164*  --   BUN 23  --  22  --   CREATININE 1.44* 1.52* 1.41*  --   CALCIUM 8.5*  --  8.4*  --   PROT  --   --   --  7.1  ALBUMIN  --   --   --  3.0*  AST  --   --   --  15  ALT  --   --   --  18  ALKPHOS  --   --   --  54  BILITOT  --   --   --  0.6  GFRNONAA 46* 43* 47*  --   GFRAA 54* 50* 55*  --   ANIONGAP 7  --  7  --      Hematology Recent Labs  Lab 11/03/19 1506 11/04/19 0317  WBC 9.7 11.6*  RBC 3.65* 3.58*  HGB 8.8* 8.5*  HCT 30.1* 29.4*  MCV 82.5 82.1  MCH 24.1* 23.7*  MCHC 29.2* 28.9*  RDW 15.0 14.9  PLT 287 253    Cardiac Enzymes Recent Labs  Lab 11/03/19 1506  TROPONINIHS 29*    Radiology    No results found.  Cardiac Studies   Echocardiogram 03/08/2019: 1. Left ventricular ejection fraction, by visual estimation, is 55 to  60%. The left ventricle has normal function. There is moderately increased  left ventricular hypertrophy.  2. Elevated left atrial pressure.  3. Left ventricular diastolic parameters are consistent with Grade I  diastolic dysfunction (impaired relaxation).  4. Global right ventricle has normal systolic function.The right  ventricular size is normal. No increase in right ventricular wall  thickness.  5. Left atrial size was moderately dilated.  6. Right atrial size was normal.  7. Mild aortic valve annular calcification.  8. The mitral valve is abnormal. Moderate mitral valve regurgitation. No  evidence of mitral stenosis.  9. There is prolapse of the posterior mitral valve leaflet. The MR jet is  eccentric and anterior, causing it to be difficult to evaluate and  possibly underestimated. Probable moderate mitral regurgitation.  10.  The tricuspid valve is normal in structure. Tricuspid valve  regurgitation is not demonstrated.  11. The aortic valve is tricuspid. Aortic valve regurgitation is mild. No  evidence of aortic valve sclerosis or stenosis.  12. There is Mild calcification of the aortic valve.  13. There is Mild thickening of the aortic valve.  14. The pulmonic valve was not well visualized. Pulmonic valve  regurgitation is mild.  15. Normal pulmonary artery systolic pressure.  Patient Profile     79 y.o. 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 He presents in transfer from [facility] for evaluation of chest pain.  Assessment & Plan    1. Chest tightness, felt short of breath with sense of orthopnea on Friday evening, symptoms are less intense at this point. Cardiac enzyme trend not overly suggestive of ACS with follow-up high-sensitivity troponin I level here of 29. ECG with nonspecific findings. Chest x-ray without acute findings.  2. Multivessel CAD as noted above status post DES to the ramus intermedius in 2008 with medically managed occluded ostial LAD associated with right and left collaterals and a 90% posterolateral stenosis.  3. Chronic anemia, possibly multifactorial. He does have a history of gastritis and gastric ulcer in April by EGD in the setting of NSAIDs. He does not describe any recent stool changes. Does report recurring abdominal pain and bloating.  4. Reportedly recurring fevers, states that this tends to occur in the evenings after dinner, he reports measured temperature at home up to 103 degrees. No leukocytosis at presentation or clear findings of infection.  HIV and SARS coronavirus 2 test negative. He has been afebrile under observation at this facility so far. Internal medicine consulted.  5. CKD stage IIIb, creatinine 1.41.  6. Myxomatous mitral valve with history of moderate mitral regurgitation.  Lexiscan Myoview had been ordered, I will hold off on this for the time being pending further work-up. I suspect that myocardial  perfusion imaging would be abnormal anyway in light of his known coronary anatomy, and we are not going to rush to a cardiac catheterization until things are further clarified. Proceed with echocardiogram to reevaluate cardiac structure and function, also reassess valvular disease. May need to consider an abdominal and pelvic CT. Follow temperature trend. Continue IV Lasix for now. Resume diet. Check ESR. Guaiac stools.  Signed, Rozann Lesches, MD  11/04/2019, 9:40 AM

## 2019-11-05 ENCOUNTER — Inpatient Hospital Stay (HOSPITAL_COMMUNITY): Payer: Medicare Other

## 2019-11-05 DIAGNOSIS — I25119 Atherosclerotic heart disease of native coronary artery with unspecified angina pectoris: Secondary | ICD-10-CM

## 2019-11-05 DIAGNOSIS — I5021 Acute systolic (congestive) heart failure: Secondary | ICD-10-CM

## 2019-11-05 DIAGNOSIS — D72829 Elevated white blood cell count, unspecified: Secondary | ICD-10-CM

## 2019-11-05 LAB — URINALYSIS, ROUTINE W REFLEX MICROSCOPIC
Bilirubin Urine: NEGATIVE
Glucose, UA: NEGATIVE mg/dL
Hgb urine dipstick: NEGATIVE
Ketones, ur: NEGATIVE mg/dL
Leukocytes,Ua: NEGATIVE
Nitrite: NEGATIVE
Protein, ur: NEGATIVE mg/dL
Specific Gravity, Urine: 1.009 (ref 1.005–1.030)
pH: 6 (ref 5.0–8.0)

## 2019-11-05 LAB — COMPREHENSIVE METABOLIC PANEL
ALT: 18 U/L (ref 0–44)
AST: 17 U/L (ref 15–41)
Albumin: 3 g/dL — ABNORMAL LOW (ref 3.5–5.0)
Alkaline Phosphatase: 53 U/L (ref 38–126)
Anion gap: 9 (ref 5–15)
BUN: 28 mg/dL — ABNORMAL HIGH (ref 8–23)
CO2: 27 mmol/L (ref 22–32)
Calcium: 8.6 mg/dL — ABNORMAL LOW (ref 8.9–10.3)
Chloride: 100 mmol/L (ref 98–111)
Creatinine, Ser: 1.62 mg/dL — ABNORMAL HIGH (ref 0.61–1.24)
GFR calc Af Amer: 46 mL/min — ABNORMAL LOW (ref >60)
GFR calc non Af Amer: 40 mL/min — ABNORMAL LOW (ref >60)
Glucose, Bld: 166 mg/dL — ABNORMAL HIGH (ref 70–99)
Potassium: 5.1 mmol/L (ref 3.5–5.1)
Sodium: 136 mmol/L (ref 135–145)
Total Bilirubin: 0.9 mg/dL (ref 0.3–1.2)
Total Protein: 7.2 g/dL (ref 6.5–8.1)

## 2019-11-05 LAB — CBC
HCT: 33 % — ABNORMAL LOW (ref 39.0–52.0)
Hemoglobin: 9.7 g/dL — ABNORMAL LOW (ref 13.0–17.0)
MCH: 24.1 pg — ABNORMAL LOW (ref 26.0–34.0)
MCHC: 29.4 g/dL — ABNORMAL LOW (ref 30.0–36.0)
MCV: 82.1 fL (ref 80.0–100.0)
Platelets: 328 10*3/uL (ref 150–400)
RBC: 4.02 MIL/uL — ABNORMAL LOW (ref 4.22–5.81)
RDW: 15.1 % (ref 11.5–15.5)
WBC: 15 10*3/uL — ABNORMAL HIGH (ref 4.0–10.5)
nRBC: 0 % (ref 0.0–0.2)

## 2019-11-05 LAB — BLOOD GAS, ARTERIAL
Acid-Base Excess: 2.8 mmol/L — ABNORMAL HIGH (ref 0.0–2.0)
Bicarbonate: 26.5 mmol/L (ref 20.0–28.0)
Drawn by: 51147
FIO2: 40
O2 Saturation: 95.9 %
Patient temperature: 37.1
pCO2 arterial: 39.1 mmHg (ref 32.0–48.0)
pH, Arterial: 7.447 (ref 7.350–7.450)
pO2, Arterial: 81.5 mmHg — ABNORMAL LOW (ref 83.0–108.0)

## 2019-11-05 LAB — LACTATE DEHYDROGENASE: LDH: 139 U/L (ref 98–192)

## 2019-11-05 LAB — GLUCOSE, CAPILLARY
Glucose-Capillary: 156 mg/dL — ABNORMAL HIGH (ref 70–99)
Glucose-Capillary: 129 mg/dL — ABNORMAL HIGH (ref 70–99)
Glucose-Capillary: 166 mg/dL — ABNORMAL HIGH (ref 70–99)
Glucose-Capillary: 159 mg/dL — ABNORMAL HIGH (ref 70–99)

## 2019-11-05 LAB — SARS CORONAVIRUS 2 BY RT PCR (HOSPITAL ORDER, PERFORMED IN ~~LOC~~ HOSPITAL LAB): SARS Coronavirus 2: NEGATIVE

## 2019-11-05 LAB — BRAIN NATRIURETIC PEPTIDE: B Natriuretic Peptide: 861 pg/mL — ABNORMAL HIGH (ref 0.0–100.0)

## 2019-11-05 LAB — SEDIMENTATION RATE: Sed Rate: 55 mm/hr — ABNORMAL HIGH (ref 0–16)

## 2019-11-05 MED ORDER — LIVING WELL WITH DIABETES BOOK
Freq: Once | Status: AC
  Filled 2019-11-05: qty 1

## 2019-11-05 MED ORDER — FUROSEMIDE 10 MG/ML IJ SOLN: 40.0000 mg | Freq: Two times a day (BID) | INTRAMUSCULAR | Status: DC

## 2019-11-05 MED ORDER — IOHEXOL 9 MG/ML PO SOLN
500.0000 mL | ORAL | Status: DC
  Administered 2019-11-05: 500 mL via ORAL

## 2019-11-05 MED ORDER — CARVEDILOL 3.125 MG PO TABS
3.1250 mg | ORAL_TABLET | Freq: Two times a day (BID) | ORAL | Status: DC
  Administered 2019-11-05 – 2019-11-06 (×2): 3.125 mg via ORAL
  Filled 2019-11-05 (×2): qty 1

## 2019-11-05 MED ORDER — IOHEXOL 9 MG/ML PO SOLN
500.0000 mL | ORAL | Status: AC
  Administered 2019-11-05: 500 mL via ORAL

## 2019-11-05 MED ORDER — FUROSEMIDE 10 MG/ML IJ SOLN
20.0000 mg | Freq: Once | INTRAMUSCULAR | Status: AC
  Administered 2019-11-05: 20 mg via INTRAVENOUS
  Filled 2019-11-05: qty 2

## 2019-11-05 MED ORDER — ZOLPIDEM TARTRATE 5 MG PO TABS
5.0000 mg | ORAL_TABLET | Freq: Every day | ORAL | Status: DC
  Administered 2019-11-05 – 2019-11-07 (×3): 5 mg via ORAL
  Filled 2019-11-05 (×3): qty 1

## 2019-11-05 NOTE — Consult Note (Addendum)
Advanced Heart Failure Team Consult Note   Primary Physician: Christopher Percy, MD PCP-Cardiologist:  Christopher Lesches, MD  Reason for Consultation: Acute systolic HF and severe mitral regurgitation  HPI:    Christopher Nelson is seen today for evaluation of acute systolic HF and severe mitral regurgitation at the request of Dr. Domenic Nelson.   Christopher Nelson has known CAD with ostially occluded LAD and high-grade lesion in Ramus -> DES by cath in 9/18. EF 35-40% at the time. F/u echo  11/20 EF 55-60% Other h/o notable for CKD 3b (creatinine 1.4) with atrophic left kidney, HTN, pre-diabetes and moderate mitral regurgitation.    Has h/o frequent PVCs treated with bb-locker.   In April 2021 had GIB due to gastric ulcer   Over last 2-3 months reports increasing weakness and fevers up to 104 every evening + night sweats and abdominal bloating with early satiety, has lost 8 pounds. Has been seen by hematology and GI  without clear etiology. On 7/2 presented to [facility] with SOB and anemia and substernal non radiating chest pain. Hstrop 29. ECG with new LBBB. BNP 861.   Echo obtained with EF 25-30% moderate RV dysfunction and mod-severe MR   While in hospital here has not had any fevers but WBC 9.3K -> 15K.  ESR 55. LDH 139. Creatinine 1.4 -> 1.6 with diuresis. Bcx x 2 11/05/19 NGTD  Had dental visit in the spring and took abx beforehand. No rash or abnormal arthalgias.   Recent w/u at [facility]: ANA, ANCA, metanephrines, 5-HIAA, anti-DNA, anti mitochondrial Abs, Sjogrens, RA, SPEP - all negative HIV negative here   Previous studies  Cath 9/18 - ostial LAD 100% - Ramus 99% -> DES - RCA mild irregularities with 90% lesion in PL branch  - EF 35-40%.  Recent echo 03/2019 with normal EF 55-60% G1DD.  Off statins due to side effects.     AB/pelvic CT 4/21 1. Colonic diverticulosis without evidence of diverticulitis. 2. Evidence of prior cholecystectomy. 3. Atrophic left kidney with  small bilateral renal cysts. 4. Moderate to marked severity enlargement of the prostate gland. 5. 9 mm urinary bladder calculus. 6. Left adrenal mass which may represent adrenal adenoma.    Review of Systems: [y] = yes, '[ ]'  = no   . General: Weight gain '[ ]' ; Weight loss '[ ]' ; Anorexia Blue.Reese ]; Fatigue Blue.Reese ]; Fever Blue.Reese ]; Chills [ y]; Weakness [ y]  . Cardiac: Chest pain/pressure '[ ]' y; Resting SOB Blue.Reese ]; Exertional SOB '[ ]' ; Orthopnea '[ ]' ; Pedal Edema '[ ]' ; Palpitations '[ ]' ; Syncope '[ ]' ; Presyncope '[ ]' ; Paroxysmal nocturnal dyspnea'[ ]'   . Pulmonary: Cough '[ ]' ; Wheezing'[ ]' ; Hemoptysis'[ ]' ; Sputum '[ ]' ; Snoring '[ ]'   . GI: Vomiting'[ ]' ; Dysphagia'[ ]' ; Melena'[ ]' ; Hematochezia '[ ]' ; Heartburn'[ ]' ; Abdominal pain '[ ]' ; Constipation '[ ]' ; Diarrhea '[ ]' ; BRBPR '[ ]'   . GU: Hematuria'[ ]' ; Dysuria '[ ]' ; Nocturia'[ ]'   . Vascular: Pain in legs with walking '[ ]' ; Pain in feet with lying flat '[ ]' ; Non-healing sores '[ ]' ; Stroke '[ ]' ; TIA '[ ]' ; Slurred speech '[ ]' ;  . Neuro: Headaches'[ ]' ; Vertigo'[ ]' ; Seizures'[ ]' ; Paresthesias'[ ]' ;Blurred vision '[ ]' ; Diplopia '[ ]' ; Vision changes '[ ]'   . Ortho/Skin: Arthritis [ y]; Joint pain [ y]; Muscle pain '[ ]' ; Joint swelling '[ ]' ; Back Pain '[ ]' ; Rash '[ ]'   . Psych: Depression'[ ]' ; Anxiety'[ ]'   . Heme: Bleeding problems '[ ]' ; Clotting disorders '[ ]' ;  Anemia '[ ]'   . Endocrine: Diabetes '[ ]' ; Thyroid dysfunction'[ ]'   Home Medications Prior to Admission medications   Medication Sig Start Date End Date Taking? Authorizing Provider  Ascorbic Acid (VITAMIN C PO) Take 1,000 mg by mouth daily.    Yes [provider]  b complex vitamins tablet Take 1 tablet by mouth daily.   Yes [provider]  Cholecalciferol (VITAMIN D-3) 1000 units CAPS Take 1,000 Units by mouth daily.   Yes [provider]  Cinnamon 500 MG TABS Take 2 tablets by mouth daily.   Yes [provider]  Coenzyme Q10 (COQ-10) 200 MG CAPS Take 200 mg by mouth daily.    Yes [provider]  Glucosamine HCl  1000 MG TABS Take 2 tablets by mouth daily.   Yes [provider]  losartan-hydrochlorothiazide (HYZAAR) 50-12.5 MG tablet Take 0.5 tablets by mouth daily.   Yes [provider]  Lysine 1000 MG TABS Take 1,000 mg by mouth daily.    Yes [provider]  MAGNESIUM PO Take 250 mg by mouth daily.    Yes [provider]  metoprolol tartrate (LOPRESSOR) 50 MG tablet Take 50 mg by mouth daily.   Yes [provider]  pantoprazole (PROTONIX) 40 MG tablet Take 1 tablet (40 mg total) by mouth 2 (two) times daily. 08/31/19 08/30/20 Yes Kathie Dike, MD  raNITIdine HCl (ZANTAC PO) Take 1 tablet by mouth at bedtime.   Yes [provider]  simethicone (MYLICON) 80 MG chewable tablet Chew 80 mg by mouth every 6 (six) hours as needed for flatulence.   Yes [provider]  Vitamin Mixture (ESTER-C) 500-60 MG TABS Take by mouth.   Yes [provider]  zinc gluconate 50 MG tablet Take 50 mg by mouth daily.   Yes [provider]  amLODipine (NORVASC) 5 MG tablet TAKE 1 TABLET BY MOUTH ONCE DAILY . APPOINTMENT REQUIRED FOR FUTURE REFILLS 04/02/19   Satira Sark, MD  aspirin EC 81 MG tablet Take 81 mg by mouth daily. Patient not taking: Reported on 11/03/2019    [provider]  atorvastatin (LIPITOR) 40 MG tablet Take 40 mg by mouth daily. Patient not taking: Reported on 11/03/2019    [provider]    Past Medical History: Past Medical History:  Diagnosis Date  . Arthritis   . CKD (chronic kidney disease), stage III   . Coronary artery disease    a. unstable angina s/p cath 01/2017 -  specifically DES to a large branching ramus intermedius. Residual disease includes ostial occlusion of the LAD with right-to-left collaterals, also 90% posterolateral stenosis. EF 35-40% by cath but then 55-60% by echo.  . Essential hypertension   . GERD (gastroesophageal reflux disease)   . Gout   . History of kidney stones   .  Ischemic cardiomyopathy    a. EF 35-40% by cath then 55-60% by echo shortly after in 01/2017.  . Mitral valve disease    a. 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  . Pre-diabetes     Past Surgical History: Past Surgical History:  Procedure Laterality Date  . BIOPSY  08/31/2019   Procedure: BIOPSY;  Surgeon: Danie Binder, MD;  Location: [facility];  Service: Endoscopy;;  duodenum gastric  . CARDIAC CATHETERIZATION  01/06/2017  . cataract s Left 04/2017  . CHOLECYSTECTOMY    . COLONOSCOPY WITH ESOPHAGOGASTRODUODENOSCOPY (EGD)  06/2017   [facility]: erosive gastritis, evidence of prior gastric ulcers (scarring), no h.pylori. pancolonic diverticulosis (suspected source for his bleeding), internal hemorrhoids  . CORONARY STENT INTERVENTION  01/06/2017   CORONARY STENT INTERVENTION  . CORONARY STENT INTERVENTION N/A 01/06/2017   Procedure: CORONARY STENT INTERVENTION;  Surgeon: Troy Sine, MD;  Location: [place] [facility];  Service: Cardiovascular;  Laterality: N/A;  . ESOPHAGOGASTRODUODENOSCOPY N/A 08/31/2019   Procedure: ESOPHAGOGASTRODUODENOSCOPY (EGD);  Surgeon: Danie Binder, MD;  Location: [facility];  Service: Endoscopy;  Laterality: N/A;  . LEFT HEART CATH AND CORONARY ANGIOGRAPHY N/A 01/06/2017   Procedure: LEFT HEART CATH AND CORONARY ANGIOGRAPHY;  Surgeon: Troy Sine, MD;  Location: [place] [facility];  Service: Cardiovascular;  Laterality: N/A;  . LITHOTRIPSY    . TONSILLECTOMY    . TOTAL HIP ARTHROPLASTY Right 05/15/2018   Procedure: TOTAL HIP ARTHROPLASTY ANTERIOR APPROACH;  Surgeon: Frederik Pear, MD;  Location: [facility];  Service: Orthopedics;  Laterality: Right;    Family History: Family History  Problem Relation Age of Onset  . Uterine cancer Mother   . Diabetes Mellitus II Sister   . Heart attack Brother        Age 74  . Pancreatic cancer Sister     Social History: Social History    Socioeconomic History  . Marital status: Married    Spouse name: Not on file  . Number of children: Not on file  . Years of education: Not on file  . Highest education level: Not on file  Occupational History  . Not on file  Tobacco Use  . Smoking status: Never Smoker  . Smokeless tobacco: Never Used  Vaping Use  . Vaping Use: Never used  Substance and Sexual Activity  . Alcohol use: No  . Drug use: No  . Sexual activity: Yes  Other Topics Concern  . Not on file  Social History Narrative  . Not on file   Social Determinants of Health   Financial Resource Strain:   . Difficulty of Paying Living Expenses:   Food Insecurity:   . Worried About Charity fundraiser in the Last Year:   . Arboriculturist in the Last Year:   Transportation Needs:   . Film/video editor (Medical):   Marland Kitchen Lack of Transportation (Non-Medical):   Physical Activity:   . Days of Exercise per Week:   . Minutes of Exercise per Session:   Stress:   . Feeling of Stress :   Social Connections:   . Frequency of Communication with Friends and Family:   . Frequency of Social Gatherings with Friends and Family:   . Attends Religious Services:   . Active Member of Clubs or Organizations:   . Attends Archivist Meetings:   Marland Kitchen Marital Status:     Allergies:  Allergies  Allergen Reactions  . Cephalosporins Itching    Objective:    Vital Signs:   Temp:  [97.6 F (36.4 C)-98.7 F (37.1 C)] 98.1 F (36.7 C) (07/05 0838) Pulse Rate:  [91-98] 98 (07/05 0307) Resp:  [17-19] 17 (07/05 0838) BP: (120-134)/(61-93) 120/61 (07/05 0838) SpO2:  [95 %-100 %] 98 % (07/05 0838) Weight:  [103.2 kg] 103.2 kg (07/05 0531) Last BM Date: 11/05/19  Weight change: Filed Weights   11/03/19 1328 11/04/19 0325 11/05/19 0531  Weight: 104.7 kg 104.5 kg 103.2 kg    Intake/Output:   Intake/Output Summary (Last 24 hours) at 11/05/2019 1323 Last data filed at 11/05/2019  1047 Gross per 24 hour  Intake 483  ml  Output 2175 ml  Net -1692 ml      Physical Exam    General:  Sitting up in bed. No resp difficulty HEENT: normal Neck: supple. JVP 5-6. Carotids 2+ bilat; no bruits. No lymphadenopathy or thyromegaly appreciated. Cor: PMI nondisplaced. Regular rate & rhythm. + PVCs soft MR Lungs: clear Abdomen: soft, nontender + distended. No hepatosplenomegaly. No bruits or masses. Hypoactive bowel sounds. Extremities: no cyanosis, clubbing, rash, edema  Small blood blister on tip of left 5th finge Neuro: alert & orientedx3, cranial nerves grossly intact. moves all 4 extremities w/o difficulty. Affect pleasant   Telemetry   Sinus 90-100 with frequent PVCs (7-16/min) Personally reviewed  EKG    NSR 91 LBBB (QRS 170m) 1 PVC Personally reviewed   Labs   Basic Metabolic Panel: Recent Labs  Lab 11/03/19 1506 11/03/19 1841 11/04/19 0317 11/05/19 0251  NA 138  --  137 136  K 3.8  --  4.6 5.1  CL 103  --  102 100  CO2 28  --  28 27  GLUCOSE 149*  --  164* 166*  BUN 23  --  22 28*  CREATININE 1.44* 1.52* 1.41* 1.62*  CALCIUM 8.5*  --  8.4* 8.6*    Liver Function Tests: Recent Labs  Lab 11/04/19 0757 11/05/19 0251  AST 15 17  ALT 18 18  ALKPHOS 54 53  BILITOT 0.6 0.9  PROT 7.1 7.2  ALBUMIN 3.0* 3.0*   No results for input(s): LIPASE, AMYLASE in the last 168 hours. No results for input(s): AMMONIA in the last 168 hours.  CBC: Recent Labs  Lab 11/03/19 1506 11/04/19 0317 11/05/19 0251  WBC 9.7 11.6* 15.0*  NEUTROABS  --  8.7*  --   HGB 8.8* 8.5* 9.7*  HCT 30.1* 29.4* 33.0*  MCV 82.5 82.1 82.1  PLT 287 253 328    Cardiac Enzymes: No results for input(s): CKTOTAL, CKMB, CKMBINDEX, TROPONINI in the last 168 hours.  BNP: BNP (last 3 results) Recent Labs    11/05/19 0251  BNP 861.0*    ProBNP (last 3 results) No results for input(s): PROBNP in the last 8760 hours.   CBG: Recent Labs  Lab 11/04/19 1825 11/04/19 2107 11/05/19 0736 11/05/19 1116    GLUCAP 229* 146* 156* 129*    Coagulation Studies: No results for input(s): LABPROT, INR in the last 72 hours.   Imaging   DG Chest 1 View  Result Date: 11/05/2019 CLINICAL DATA:  Shortness of breath EXAM: CHEST  1 VIEW COMPARISON:  11/02/2019 FINDINGS: Cardiac shadow is stable. Vascular congestion is again identified similar to that seen on the prior exam. Some slight increase in the degree of interstitial edema is seen. Small effusions are noted. No bony abnormality is seen. IMPRESSION: Slight increase in vascular congestion with increase in interstitial edema. Electronically Signed   By: MInez CatalinaM.D.   On: 11/05/2019 03:45   DG Abd 1 View  Result Date: 11/05/2019 CLINICAL DATA:  Shortness of breath and abdominal pain EXAM: ABDOMEN - 1 VIEW COMPARISON:  11/02/2019 FINDINGS: Scattered large and small bowel gas is noted. No abnormal mass or abnormal calcifications are seen. Postsurgical changes are noted. No free air is seen. IMPRESSION: No acute abnormality noted. Electronically Signed   By: MInez CatalinaM.D.   On: 11/05/2019 03:45   [place]Abdomen Limited  Result Date: 11/04/2019 CLINICAL DATA:  Assess right upper quadrant and abdomen for ascites.  History of prior cholecystectomy. EXAM: ULTRASOUND ABDOMEN LIMITED RIGHT UPPER QUADRANT COMPARISON:  None. FINDINGS: Gallbladder: The gallbladder is surgically absent. Common bile duct: Diameter: 2.3 mm Liver: No focal lesion identified. Within normal limits in parenchymal echogenicity. Portal vein is patent on color Doppler imaging with normal direction of blood flow towards the liver. Other: No intra-abdominal free fluid is seen. A right pleural effusion is noted. IMPRESSION: 1. Findings consistent with prior cholecystectomy. 2. Right pleural effusion. 3. No evidence of ascites. Electronically Signed   By: Virgina Norfolk M.D.   On: 11/04/2019 15:22   ECHOCARDIOGRAM COMPLETE  Result Date: 11/04/2019    ECHOCARDIOGRAM REPORT   Patient Name:    OSMOND STECKMAN Date of Exam: 11/04/2019 Medical Rec #:  [id number]        Height:       74.0 in Accession #:    [id number]       Weight:       230.3 lb Date of Birth:  10/02/1940       BSA:          2.308 m Patient Age:    34 years         BP:           138/95 mmHg Patient Gender: M                HR:           89 bpm. Exam Location:  Inpatient Procedure: 2D Echo, Cardiac Doppler and Color Doppler Indications:    Chest pain  History:        Patient has prior history of Echocardiogram examinations, most                 recent 03/08/2019. CHF, CAD, Mitral Valve Disease; Risk                 Factors:Dyslipidemia. CKD.  Sonographer:    Clayton Lefort RDCS (AE) Referring Phys: [place] Comments: Suboptimal subcostal window. IMPRESSIONS  1. Left ventricular ejection fraction, by estimation, is 25 to 30%. The left ventricle has severely decreased function. The left ventricle demonstrates regional wall motion abnormalities (see scoring diagram/findings for description). There is mild left  ventricular hypertrophy. Left ventricular diastolic parameters are indeterminate.  2. Right ventricular systolic function reduced. The right ventricular size is normal. Tricuspid regurgitation signal is inadequate for assessing PA pressure.  3. Left atrial size was severely dilated.  4. The mitral valve is abnormal, restricted posterior leaflet motion, mild annular calcification. Moderate to severe mitral valve regurgitation, eccentric and two jets noted.  5. The aortic valve is tricuspid. Aortic valve regurgitation is trivial. Mild to moderate aortic valve sclerosis/calcification is present, without any evidence of aortic stenosis.  6. Unable to estimate CVP. FINDINGS  Left Ventricle: Left ventricular ejection fraction, by estimation, is 25 to 30%. The left ventricle has severely decreased function. The left ventricle demonstrates regional wall motion abnormalities. The left ventricular internal cavity size was normal   in size. There is mild left ventricular hypertrophy. Left ventricular diastolic parameters are indeterminate.  LV Wall Scoring: The mid and distal anterior septum, mid inferolateral segment, mid inferoseptal segment, apical inferior segment, and apex are akinetic. The entire anterior wall, inferior wall, basal inferolateral segment, apical lateral segment, and mid anterolateral segment are hypokinetic. The basal anteroseptal segment, basal anterolateral segment, and basal inferoseptal segment are normal. Right Ventricle: The right ventricular size is normal. No increase in right ventricular  wall thickness. Right ventricular systolic function reduced. Tricuspid regurgitation signal is inadequate for assessing PA pressure. Left Atrium: Left atrial size was severely dilated. Right Atrium: Right atrial size was normal in size. Pericardium: There is no evidence of pericardial effusion. Mitral Valve: The mitral valve is abnormal. Mild mitral annular calcification. Moderate to severe mitral valve regurgitation. Tricuspid Valve: The tricuspid valve is grossly normal. Tricuspid valve regurgitation is trivial. Aortic Valve: The aortic valve is tricuspid. Aortic valve regurgitation is trivial. Mild to moderate aortic valve sclerosis/calcification is present, without any evidence of aortic stenosis. Mild aortic valve annular calcification. Aortic valve mean gradient measures 2.7 mmHg. Aortic valve peak gradient measures 4.3 mmHg. Aortic valve area, by VTI measures 3.57 cm. Pulmonic Valve: The pulmonic valve was grossly normal. Pulmonic valve regurgitation is trivial. Aorta: The aortic root is normal in size and structure. Venous: Unable to assess CVP. The inferior vena cava was not well visualized. IAS/Shunts: No atrial level shunt detected by color flow Doppler.  LEFT VENTRICLE PLAX 2D LVIDd:         5.10 cm LVIDs:         4.40 cm LV PW:         2.60 cm LV IVS:        1.70 cm LVOT diam:     2.50 cm LV SV:         62 LV SV  Index:   27 LVOT Area:     4.91 cm  LV Volumes (MOD) LV vol d, MOD A2C: 267.0 ml LV vol d, MOD A4C: 190.0 ml LV vol s, MOD A2C: 169.0 ml LV vol s, MOD A4C: 119.0 ml LV SV MOD A2C:     98.0 ml LV SV MOD A4C:     190.0 ml LV SV MOD BP:      82.5 ml RIGHT VENTRICLE RV Basal diam:  3.80 cm RV Mid diam:    3.70 cm RV S prime:     13.90 cm/s TAPSE (M-mode): 2.6 cm LEFT ATRIUM              Index       RIGHT ATRIUM           Index LA diam:        5.10 cm  2.21 cm/m  RA Area:     20.20 cm LA Vol (A2C):   130.0 ml 56.33 ml/m RA Volume:   71.90 ml  31.16 ml/m LA Vol (A4C):   114.0 ml 49.40 ml/m LA Biplane Vol: 123.0 ml 53.30 ml/m  AORTIC VALVE AV Area (Vmax):    3.23 cm AV Area (Vmean):   3.05 cm AV Area (VTI):     3.57 cm AV Vmax:           103.73 cm/s AV Vmean:          72.067 cm/s AV VTI:            0.173 m AV Peak Grad:      4.3 mmHg AV Mean Grad:      2.7 mmHg LVOT Vmax:         68.30 cm/s LVOT Vmean:        44.733 cm/s LVOT VTI:          0.126 m LVOT/AV VTI ratio: 0.73  AORTA Ao Asc diam: 3.60 cm MR Peak grad:    101.2 mmHg MR Mean grad:    57.0 mmHg   SHUNTS MR Vmax:  503.00 cm/s Systemic VTI:  0.13 m MR Vmean:        345.0 cm/s  Systemic Diam: 2.50 cm MR PISA:         4.02 cm MR PISA Eff ROA: 32 mm MR PISA Radius:  0.80 cm Christopher Lesches MD Electronically signed by Christopher Lesches MD Signature Date/Time: 11/04/2019/2:42:20 PM    Final       Medications:     Current Medications: . vitamin C  1,000 mg Oral Daily  . aspirin EC  81 mg Oral Daily  . atorvastatin  40 mg Oral Daily  . ferrous sulfate  325 mg Oral Q breakfast  . furosemide  40 mg Intravenous BID  . heparin  5,000 Units Subcutaneous Q8H  . insulin aspart  0-9 Units Subcutaneous TID WC  . iohexol  500 mL Oral Q1H  . losartan  25 mg Oral Daily  . pantoprazole  40 mg Oral BID  . polyethylene glycol  17 g Oral BID  . potassium chloride  20 mEq Oral BID  . senna-docusate  1 tablet Oral BID  . sodium chloride flush  3 mL  Intravenous Q12H     Infusions: . sodium chloride         Assessment/Plan   1. Acute systolic HF - ECHO 0/73 EF 55-60% - ECHO 7/21 EF 20% Moderate RV dysfunction mod-severe MR - cath 2018 with totally occluded LAD. Stent to Ramus - Volume status ok. Stop IV lasix for now - Hold losartan until after cath - I suspect this is NICM (?PVCs) but will need eventual R/L cath to further assess - consider cMRI - check viral panel  2. Fevers, anemia, chills - extensive w/u so far negative. Reports fevers at home but so far none here - ESR 55 - Bcx pending - Unifying diagnosis remains unclear.  - I am concerned about possible sub-acute bacterial endocarditis following recent dental visit in [place] although he did have abx prior. Will arrange for TEE tomorrow to assess mitral regurgitation and look for vegetations - Check viral panel - Consider ID input  3. CKD 3b - atrophic left kidney on CT  4. CAD with chest tightness - as noted above status post DES to the ramus intermedius in 2018 with medically managed occluded ostial LAD associated with right and left collaterals and a 90% posterolateral stenosis. - no evidence of ACS  5. Myxomatous mitral valve with history of moderate mitral regurgitation. - TEE tomorrow   6. Frequent PVCs - start carvedilol 3.125 bid - consider mexilitene   Length of Stay: 2  Glori Bickers, MD  11/05/2019, 1:23 PM  Advanced Heart Failure Team Pager [phone] (M-F; 7a - 4p)  Please contact [facility] for night-coverage after hours (4p -7a ) and weekends on [email or web address]

## 2019-11-05 NOTE — Progress Notes (Signed)
Patient complaining of increased shortness of breath.  Lung sounds unchanged from RN earlier assessment (clear in upper lobes bilaterally and diminished in lower lobes bilaterally).  Per patient had a small bowel movement earlier in the day and has been passing gas but no further bowel movement.  Patient mouth breathes when sleeping and oxygen has been turned up to 5L nasal cannula (from 2L when patient is awake) to maintain oxygen saturation greater than 92% when patient sleeping.  Cardiology paged.  Dr. Massie Bougie returned paged and was updated, orders entered per MD.

## 2019-11-05 NOTE — Progress Notes (Signed)
COVID test performed at [facility] on 7/3 with negative test results. COVID test ordered here - spoke with cards PA - Furth, regarding the need for another COVID swab. Instructed to perform COVID swab here. Swab obtained and sent to lab.

## 2019-11-05 NOTE — Progress Notes (Addendum)
PROGRESS NOTE    JESSIE SCHRIEBER  WCH:[id number] DOB: Sep 09, 1940 DOA: 11/03/2019 PCP: Rory Percy, MD   Brief Narrative: 79 year old with past medical history significant for hypertension, GERD, CAD status post CABG 2020 who was transferred from [facility] for work-up of chest pain.  Patient has been following with [facility] hematology for chronic anemia, last seen on 6/24.  Over past several weeks patient has been experiencing sweats, chills and fever after dinner associated with tightness and bloating.  Work-up included: 5 HIAA, anticentromere antibody, ANCA, anti DNA, RF, ANA, cortisol, metanephrines, B12, SPEP, gastrin which were found to be unremarkable.  Initial white blood cell at [facility] was elevated at 15, hemoglobin 9.5, chest x-ray with central vascular congestion and mild interstitial opacity suggesting edema.  UA was also unremarkable.  On arrival to [facility] white blood cell was noted to be at 9.7.  Afebrile.  Covid test: Negative   Assessment & Plan:   Active Problems:   Anemia   Chest pain of uncertain etiology   Acute on chronic combined systolic and diastolic CHF (congestive heart failure) (HCC)   CAD in native artery   Acute CHF (congestive heart failure) (HCC)  1-Acute systolic heart failure exacerbation: History of ischemic cardiomyopathy with prior ejection fraction 55% Patient presents with shortness of breath, chest tightness, elevated BNP, chest x-ray with pulmonary congestion. Continue with IV Lasix. Cardiology already following. Echo: Ejection fraction 25 to 30%, right ventricular function reduced as well, moderate to severe mitral valve rehabilitation -1.5 L weight: 230--230 -Heart failure team consulted.   2-Abdominal distention: He had a CT abdomen and pelvis April 2021: Colonic diverticulosis without evidence of diverticulitis.  Prior cholecystectomy, atrophic left kidney with a small bilateral renal cyst, moderate to marked severity enlargement of  prostate gland.  Left adrenal mass which may represent adrenal adenoma. -Right upper quadrant ultrasound; prior cholecystectomy, right pleural effusion, no evidence of ascites, bile duct 2.3 mm -Bowel regimen. -will proceed with CT abdomen pelvis due to leukocytosis.  -[place] negative for ascites.   Left adrenal mass: Need follow-up. Check metanephrine levels, aldosterone, aldosterone renin ratio, DHEA.   Fevers: Patient reports fever for the last several weeks on and off. He has remained afebrile in the hospital. HIV negative Liver function test normal He had a chest x-ray and UA performed at [facility] counting negative for sign of infection. Now with leukocytosis, will check urine culture, blood culture, CT abdomen pelvis.  Check metanephrine level.  Iron deficiency anemia: Patient had endoscopy 4/21 showed gastric ulcers and erosion with mild inflammation in the duodenal bulb Continue with PPI Started  iron.  Hypertension: Hold Norvasc due to low ejection fraction Continue with metoprolol, Cozaar, Lasix.  AKI CKD stage IIIb, creatinine baseline 1.3--1.4 Increased to 1.6.  Could be related to heart failure Monitor renal function.  Diabetes; history of pre diabetic. Diabetes educator.  SSI Estimated body mass index is 29.21 kg/m as calculated from the following:   Height as of this encounter: 6\' 2"  (1.88 m).   Weight as of this encounter: 103.2 kg.   DVT prophylaxis: Heparin Code Status: Full code Family Communication: Discussed with patient Disposition Plan:  Status is: Inpatient  Remains inpatient appropriate because:IV treatments appropriate due to intensity of illness or inability to take PO   Dispo: The patient is from: Home              Anticipated d/c is to: Home  Anticipated d/c date is: 2 days              Patient currently is not medically stable to d/c.  Is currently requiring IV lasix.         Consultants:   Cardiology Primary    Procedures:   US  ECHO; Left ventricular ejection fraction, by estimation, is 25 to 30%. The  left ventricle has severely decreased function. The left ventricle  demonstrates regional wall motion abnormalities (see scoring  diagram/findings for description). There is mild left  ventricular hypertrophy. Left ventricular diastolic parameters are  indeterminate.  2. Right ventricular systolic function reduced. The right ventricular  size is normal. Tricuspid regurgitation signal is inadequate for assessing  PA pressure.  3. Left atrial size was severely dilated.  4. The mitral valve is abnormal, restricted posterior leaflet motion,  mild annular calcification. Moderate to severe mitral valve regurgitation,  eccentric and two jets noted.  5. The aortic valve is tricuspid. Aortic valve regurgitation is trivial.  Mild to moderate aortic valve sclerosis/calcification is present, without  any evidence of aortic stenosis.  6. Unable to estimate CVP.   Antimicrobials:  none  Subjective: Had episode of shortness of breath and orthopnea last night.  He was able to sleep last night.  He wants to sleep today.  He had to small bowel movement yesterday.  Still with abdominal distention. He denies back pain Objective: Vitals:   11/05/19 0307 11/05/19 0524 11/05/19 0531 11/05/19 0838  BP: (!) 134/92   120/61  Pulse: 98     Resp: 18   17  Temp: 98.4 F (36.9 C)   98.1 F (36.7 C)  TempSrc: Oral   Oral  SpO2: 100% 97%  98%  Weight:   103.2 kg   Height:        Intake/Output Summary (Last 24 hours) at 11/05/2019 1101 Last data filed at 11/05/2019 1047 Gross per 24 hour  Intake 483 ml  Output 2175 ml  Net -1692 ml   Filed Weights   11/03/19 1328 11/04/19 0325 11/05/19 0531  Weight: 104.7 kg 104.5 kg 103.2 kg    Examination:  General exam: NAD Respiratory system: Respiratory effort normal, crackles bilaterally Cardiovascular system: S1-S2 regular rhythm or  rate Gastrointestinal system: Bowel sounds present, soft nontender nondistended Central nervous system: Sleepy Extremities: Symmetric power Skin; no rash s   Data Reviewed: I have personally reviewed following labs and imaging studies  CBC: Recent Labs  Lab 11/03/19 1506 11/04/19 0317 11/05/19 0251  WBC 9.7 11.6* 15.0*  NEUTROABS  --  8.7*  --   HGB 8.8* 8.5* 9.7*  HCT 30.1* 29.4* 33.0*  MCV 82.5 82.1 82.1  PLT 287 253 465   Basic Metabolic Panel: Recent Labs  Lab 11/03/19 1506 11/03/19 1841 11/04/19 0317 11/05/19 0251  NA 138  --  137 136  K 3.8  --  4.6 5.1  CL 103  --  102 100  CO2 28  --  28 27  GLUCOSE 149*  --  164* 166*  BUN 23  --  22 28*  CREATININE 1.44* 1.52* 1.41* 1.62*  CALCIUM 8.5*  --  8.4* 8.6*   GFR: Estimated Creatinine Clearance: 48.2 mL/min (A) (by C-G formula based on SCr of 1.62 mg/dL (H)). Liver Function Tests: Recent Labs  Lab 11/04/19 0757 11/05/19 0251  AST 15 17  ALT 18 18  ALKPHOS 54 53  BILITOT 0.6 0.9  PROT 7.1 7.2  ALBUMIN 3.0* 3.0*  No results for input(s): LIPASE, AMYLASE in the last 168 hours. No results for input(s): AMMONIA in the last 168 hours. Coagulation Profile: No results for input(s): INR, PROTIME in the last 168 hours. Cardiac Enzymes: No results for input(s): CKTOTAL, CKMB, CKMBINDEX, TROPONINI in the last 168 hours. BNP (last 3 results) No results for input(s): PROBNP in the last 8760 hours. HbA1C: Recent Labs    11/04/19 0317  HGBA1C 7.9*   CBG: Recent Labs  Lab 11/04/19 1825 11/04/19 2107 11/05/19 0736  GLUCAP 229* 146* 156*   Lipid Profile: No results for input(s): CHOL, HDL, LDLCALC, TRIG, CHOLHDL, LDLDIRECT in the last 72 hours. Thyroid Function Tests: Recent Labs    11/04/19 0757  TSH 0.614   Anemia Panel: Recent Labs    11/04/19 1101  FERRITIN 37  TIBC 258  IRON 18*   Sepsis Labs: No results for input(s): PROCALCITON, LATICACIDVEN in the last 168 hours.  No results found  for this or any previous visit (from the past 240 hour(s)).       Radiology Studies: DG Chest 1 View  Result Date: 11/05/2019 CLINICAL DATA:  Shortness of breath EXAM: CHEST  1 VIEW COMPARISON:  11/02/2019 FINDINGS: Cardiac shadow is stable. Vascular congestion is again identified similar to that seen on the prior exam. Some slight increase in the degree of interstitial edema is seen. Small effusions are noted. No bony abnormality is seen. IMPRESSION: Slight increase in vascular congestion with increase in interstitial edema. Electronically Signed   By: Inez Catalina M.D.   On: 11/05/2019 03:45   DG Abd 1 View  Result Date: 11/05/2019 CLINICAL DATA:  Shortness of breath and abdominal pain EXAM: ABDOMEN - 1 VIEW COMPARISON:  11/02/2019 FINDINGS: Scattered large and small bowel gas is noted. No abnormal mass or abnormal calcifications are seen. Postsurgical changes are noted. No free air is seen. IMPRESSION: No acute abnormality noted. Electronically Signed   By: Inez Catalina M.D.   On: 11/05/2019 03:45   US Abdomen Limited  Result Date: 11/04/2019 CLINICAL DATA:  Assess right upper quadrant and abdomen for ascites. History of prior cholecystectomy. EXAM: ULTRASOUND ABDOMEN LIMITED RIGHT UPPER QUADRANT COMPARISON:  None. FINDINGS: Gallbladder: The gallbladder is surgically absent. Common bile duct: Diameter: 2.3 mm Liver: No focal lesion identified. Within normal limits in parenchymal echogenicity. Portal vein is patent on color Doppler imaging with normal direction of blood flow towards the liver. Other: No intra-abdominal free fluid is seen. A right pleural effusion is noted. IMPRESSION: 1. Findings consistent with prior cholecystectomy. 2. Right pleural effusion. 3. No evidence of ascites. Electronically Signed   By: Virgina Norfolk M.D.   On: 11/04/2019 15:22   ECHOCARDIOGRAM COMPLETE  Result Date: 11/04/2019    ECHOCARDIOGRAM REPORT   Patient Name:   SABRI TEAL Date of Exam: 11/04/2019  Medical Rec #:  [id number]        Height:       74.0 in Accession #:    [id number]       Weight:       230.3 lb Date of Birth:  03/05/1941       BSA:          2.308 m Patient Age:    27 years         BP:           138/95 mmHg Patient Gender: M                HR:  89 bpm. Exam Location:  Inpatient Procedure: 2D Echo, Cardiac Doppler and Color Doppler Indications:    Chest pain  History:        Patient has prior history of Echocardiogram examinations, most                 recent 03/08/2019. CHF, CAD, Mitral Valve Disease; Risk                 Factors:Dyslipidemia. CKD.  Sonographer:    Clayton Lefort RDCS (AE) Referring Phys: [place] Comments: Suboptimal subcostal window. IMPRESSIONS  1. Left ventricular ejection fraction, by estimation, is 25 to 30%. The left ventricle has severely decreased function. The left ventricle demonstrates regional wall motion abnormalities (see scoring diagram/findings for description). There is mild left  ventricular hypertrophy. Left ventricular diastolic parameters are indeterminate.  2. Right ventricular systolic function reduced. The right ventricular size is normal. Tricuspid regurgitation signal is inadequate for assessing PA pressure.  3. Left atrial size was severely dilated.  4. The mitral valve is abnormal, restricted posterior leaflet motion, mild annular calcification. Moderate to severe mitral valve regurgitation, eccentric and two jets noted.  5. The aortic valve is tricuspid. Aortic valve regurgitation is trivial. Mild to moderate aortic valve sclerosis/calcification is present, without any evidence of aortic stenosis.  6. Unable to estimate CVP. FINDINGS  Left Ventricle: Left ventricular ejection fraction, by estimation, is 25 to 30%. The left ventricle has severely decreased function. The left ventricle demonstrates regional wall motion abnormalities. The left ventricular internal cavity size was normal  in size. There is mild left ventricular  hypertrophy. Left ventricular diastolic parameters are indeterminate.  LV Wall Scoring: The mid and distal anterior septum, mid inferolateral segment, mid inferoseptal segment, apical inferior segment, and apex are akinetic. The entire anterior wall, inferior wall, basal inferolateral segment, apical lateral segment, and mid anterolateral segment are hypokinetic. The basal anteroseptal segment, basal anterolateral segment, and basal inferoseptal segment are normal. Right Ventricle: The right ventricular size is normal. No increase in right ventricular wall thickness. Right ventricular systolic function reduced. Tricuspid regurgitation signal is inadequate for assessing PA pressure. Left Atrium: Left atrial size was severely dilated. Right Atrium: Right atrial size was normal in size. Pericardium: There is no evidence of pericardial effusion. Mitral Valve: The mitral valve is abnormal. Mild mitral annular calcification. Moderate to severe mitral valve regurgitation. Tricuspid Valve: The tricuspid valve is grossly normal. Tricuspid valve regurgitation is trivial. Aortic Valve: The aortic valve is tricuspid. Aortic valve regurgitation is trivial. Mild to moderate aortic valve sclerosis/calcification is present, without any evidence of aortic stenosis. Mild aortic valve annular calcification. Aortic valve mean gradient measures 2.7 mmHg. Aortic valve peak gradient measures 4.3 mmHg. Aortic valve area, by VTI measures 3.57 cm. Pulmonic Valve: The pulmonic valve was grossly normal. Pulmonic valve regurgitation is trivial. Aorta: The aortic root is normal in size and structure. Venous: Unable to assess CVP. The inferior vena cava was not well visualized. IAS/Shunts: No atrial level shunt detected by color flow Doppler.  LEFT VENTRICLE PLAX 2D LVIDd:         5.10 cm LVIDs:         4.40 cm LV PW:         2.60 cm LV IVS:        1.70 cm LVOT diam:     2.50 cm LV SV:         62 LV SV Index:   27 LVOT Area:  4.91 cm  LV  Volumes (MOD) LV vol d, MOD A2C: 267.0 ml LV vol d, MOD A4C: 190.0 ml LV vol s, MOD A2C: 169.0 ml LV vol s, MOD A4C: 119.0 ml LV SV MOD A2C:     98.0 ml LV SV MOD A4C:     190.0 ml LV SV MOD BP:      82.5 ml RIGHT VENTRICLE RV Basal diam:  3.80 cm RV Mid diam:    3.70 cm RV S prime:     13.90 cm/s TAPSE (M-mode): 2.6 cm LEFT ATRIUM              Index       RIGHT ATRIUM           Index LA diam:        5.10 cm  2.21 cm/m  RA Area:     20.20 cm LA Vol (A2C):   130.0 ml 56.33 ml/m RA Volume:   71.90 ml  31.16 ml/m LA Vol (A4C):   114.0 ml 49.40 ml/m LA Biplane Vol: 123.0 ml 53.30 ml/m  AORTIC VALVE AV Area (Vmax):    3.23 cm AV Area (Vmean):   3.05 cm AV Area (VTI):     3.57 cm AV Vmax:           103.73 cm/s AV Vmean:          72.067 cm/s AV VTI:            0.173 m AV Peak Grad:      4.3 mmHg AV Mean Grad:      2.7 mmHg LVOT Vmax:         68.30 cm/s LVOT Vmean:        44.733 cm/s LVOT VTI:          0.126 m LVOT/AV VTI ratio: 0.73  AORTA Ao Asc diam: 3.60 cm MR Peak grad:    101.2 mmHg MR Mean grad:    57.0 mmHg   SHUNTS MR Vmax:         503.00 cm/s Systemic VTI:  0.13 m MR Vmean:        345.0 cm/s  Systemic Diam: 2.50 cm MR PISA:         4.02 cm MR PISA Eff ROA: 32 mm MR PISA Radius:  0.80 cm Rozann Lesches MD Electronically signed by Rozann Lesches MD Signature Date/Time: 11/04/2019/2:42:20 PM    Final         Scheduled Meds: . vitamin C  1,000 mg Oral Daily  . aspirin EC  81 mg Oral Daily  . atorvastatin  40 mg Oral Daily  . ferrous sulfate  325 mg Oral Q breakfast  . furosemide  40 mg Intravenous BID  . heparin  5,000 Units Subcutaneous Q8H  . insulin aspart  0-9 Units Subcutaneous TID WC  . living well with diabetes book   Does not apply Once  . losartan  25 mg Oral Daily  . pantoprazole  40 mg Oral BID  . polyethylene glycol  17 g Oral BID  . potassium chloride  20 mEq Oral BID  . senna-docusate  1 tablet Oral BID  . sodium chloride flush  3 mL Intravenous Q12H   Continuous  Infusions: . sodium chloride       LOS: 2 days    Time spent: 35 minutes.     Elmarie Shiley, MD [facility]   If 7PM-7AM, please contact night-coverage [email or web address]  11/05/2019, 11:01 AM

## 2019-11-05 NOTE — H&P (View-Only) (Signed)
Advanced Heart Failure Team Consult Note   Primary Physician: Rory Percy, MD PCP-Cardiologist:  Rozann Lesches, MD  Reason for Consultation: Acute systolic HF and severe mitral regurgitation  HPI:    Christopher Nelson is seen today for evaluation of acute systolic HF and severe mitral regurgitation at the request of Dr. Domenic Polite.   Christopher Nelson has known CAD with ostially occluded LAD and high-grade lesion in Ramus -> DES by cath in 9/18. EF 35-40% at the time. F/u echo  11/20 EF 55-60% Other h/o notable for CKD 3b (creatinine 1.4) with atrophic left kidney, HTN, pre-diabetes and moderate mitral regurgitation.    Has h/o frequent PVCs treated with bb-locker.   In April 2021 had GIB due to gastric ulcer   Over last 2-3 months reports increasing weakness and fevers up to 104 every evening + night sweats and abdominal bloating with early satiety, has lost 8 pounds. Has been seen by hematology and GI  without clear etiology. On 7/2 presented to [facility] with SOB and anemia and substernal non radiating chest pain. Hstrop 29. ECG with new LBBB. BNP 861.   Echo obtained with EF 25-30% moderate RV dysfunction and mod-severe MR   While in hospital here has not had any fevers but WBC 9.3K -> 15K.  ESR 55. LDH 139. Creatinine 1.4 -> 1.6 with diuresis. Bcx x 2 11/05/19 NGTD  Had dental visit in the spring and took abx beforehand. No rash or abnormal arthalgias.   Recent w/u at [facility]: ANA, ANCA, metanephrines, 5-HIAA, anti-DNA, anti mitochondrial Abs, Sjogrens, RA, SPEP - all negative HIV negative here   Previous studies  Cath 9/18 - ostial LAD 100% - Ramus 99% -> DES - RCA mild irregularities with 90% lesion in PL branch  - EF 35-40%.  Recent echo 03/2019 with normal EF 55-60% G1DD.  Off statins due to side effects.     AB/pelvic CT 4/21 1. Colonic diverticulosis without evidence of diverticulitis. 2. Evidence of prior cholecystectomy. 3. Atrophic left kidney with  small bilateral renal cysts. 4. Moderate to marked severity enlargement of the prostate gland. 5. 9 mm urinary bladder calculus. 6. Left adrenal mass which may represent adrenal adenoma.    Review of Systems: [y] = yes, '[ ]'  = no   . General: Weight gain '[ ]' ; Weight loss '[ ]' ; Anorexia Blue.Reese ]; Fatigue Blue.Reese ]; Fever Blue.Reese ]; Chills [ y]; Weakness [ y]  . Cardiac: Chest pain/pressure '[ ]' y; Resting SOB Blue.Reese ]; Exertional SOB '[ ]' ; Orthopnea '[ ]' ; Pedal Edema '[ ]' ; Palpitations '[ ]' ; Syncope '[ ]' ; Presyncope '[ ]' ; Paroxysmal nocturnal dyspnea'[ ]'   . Pulmonary: Cough '[ ]' ; Wheezing'[ ]' ; Hemoptysis'[ ]' ; Sputum '[ ]' ; Snoring '[ ]'   . GI: Vomiting'[ ]' ; Dysphagia'[ ]' ; Melena'[ ]' ; Hematochezia '[ ]' ; Heartburn'[ ]' ; Abdominal pain '[ ]' ; Constipation '[ ]' ; Diarrhea '[ ]' ; BRBPR '[ ]'   . GU: Hematuria'[ ]' ; Dysuria '[ ]' ; Nocturia'[ ]'   . Vascular: Pain in legs with walking '[ ]' ; Pain in feet with lying flat '[ ]' ; Non-healing sores '[ ]' ; Stroke '[ ]' ; TIA '[ ]' ; Slurred speech '[ ]' ;  . Neuro: Headaches'[ ]' ; Vertigo'[ ]' ; Seizures'[ ]' ; Paresthesias'[ ]' ;Blurred vision '[ ]' ; Diplopia '[ ]' ; Vision changes '[ ]'   . Ortho/Skin: Arthritis [ y]; Joint pain [ y]; Muscle pain '[ ]' ; Joint swelling '[ ]' ; Back Pain '[ ]' ; Rash '[ ]'   . Psych: Depression'[ ]' ; Anxiety'[ ]'   . Heme: Bleeding problems '[ ]' ; Clotting disorders '[ ]' ;  Anemia '[ ]'   . Endocrine: Diabetes '[ ]' ; Thyroid dysfunction'[ ]'   Home Medications Prior to Admission medications   Medication Sig Start Date End Date Taking? Authorizing Provider  Ascorbic Acid (VITAMIN C PO) Take 1,000 mg by mouth daily.    Yes [provider]  b complex vitamins tablet Take 1 tablet by mouth daily.   Yes [provider]  Cholecalciferol (VITAMIN D-3) 1000 units CAPS Take 1,000 Units by mouth daily.   Yes [provider]  Cinnamon 500 MG TABS Take 2 tablets by mouth daily.   Yes [provider]  Coenzyme Q10 (COQ-10) 200 MG CAPS Take 200 mg by mouth daily.    Yes [provider]  Glucosamine HCl  1000 MG TABS Take 2 tablets by mouth daily.   Yes [provider]  losartan-hydrochlorothiazide (HYZAAR) 50-12.5 MG tablet Take 0.5 tablets by mouth daily.   Yes [provider]  Lysine 1000 MG TABS Take 1,000 mg by mouth daily.    Yes [provider]  MAGNESIUM PO Take 250 mg by mouth daily.    Yes [provider]  metoprolol tartrate (LOPRESSOR) 50 MG tablet Take 50 mg by mouth daily.   Yes [provider]  pantoprazole (PROTONIX) 40 MG tablet Take 1 tablet (40 mg total) by mouth 2 (two) times daily. 08/31/19 08/30/20 Yes Kathie Dike, MD  raNITIdine HCl (ZANTAC PO) Take 1 tablet by mouth at bedtime.   Yes [provider]  simethicone (MYLICON) 80 MG chewable tablet Chew 80 mg by mouth every 6 (six) hours as needed for flatulence.   Yes [provider]  Vitamin Mixture (ESTER-C) 500-60 MG TABS Take by mouth.   Yes [provider]  zinc gluconate 50 MG tablet Take 50 mg by mouth daily.   Yes [provider]  amLODipine (NORVASC) 5 MG tablet TAKE 1 TABLET BY MOUTH ONCE DAILY . APPOINTMENT REQUIRED FOR FUTURE REFILLS 04/02/19   Satira Sark, MD  aspirin EC 81 MG tablet Take 81 mg by mouth daily. Patient not taking: Reported on 11/03/2019    [provider]  atorvastatin (LIPITOR) 40 MG tablet Take 40 mg by mouth daily. Patient not taking: Reported on 11/03/2019    [provider]    Past Medical History: Past Medical History:  Diagnosis Date  . Arthritis   . CKD (chronic kidney disease), stage III   . Coronary artery disease    a. unstable angina s/p cath 01/2017 -  specifically DES to a large branching ramus intermedius. Residual disease includes ostial occlusion of the LAD with right-to-left collaterals, also 90% posterolateral stenosis. EF 35-40% by cath but then 55-60% by echo.  . Essential hypertension   . GERD (gastroesophageal reflux disease)   . Gout   . History of kidney stones   .  Ischemic cardiomyopathy    a. EF 35-40% by cath then 55-60% by echo shortly after in 01/2017.  . Mitral valve disease    a. 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  . Pre-diabetes     Past Surgical History: Past Surgical History:  Procedure Laterality Date  . BIOPSY  08/31/2019   Procedure: BIOPSY;  Surgeon: Danie Binder, MD;  Location: [facility];  Service: Endoscopy;;  duodenum gastric  . CARDIAC CATHETERIZATION  01/06/2017  . cataract s Left 04/2017  . CHOLECYSTECTOMY    . COLONOSCOPY WITH ESOPHAGOGASTRODUODENOSCOPY (EGD)  06/2017   [facility]: erosive gastritis, evidence of prior gastric ulcers (scarring), no h.pylori. pancolonic diverticulosis (suspected source for his bleeding), internal hemorrhoids  . CORONARY STENT INTERVENTION  01/06/2017   CORONARY STENT INTERVENTION  . CORONARY STENT INTERVENTION N/A 01/06/2017   Procedure: CORONARY STENT INTERVENTION;  Surgeon: Troy Sine, MD;  Location: [place] [facility];  Service: Cardiovascular;  Laterality: N/A;  . ESOPHAGOGASTRODUODENOSCOPY N/A 08/31/2019   Procedure: ESOPHAGOGASTRODUODENOSCOPY (EGD);  Surgeon: Danie Binder, MD;  Location: [facility];  Service: Endoscopy;  Laterality: N/A;  . LEFT HEART CATH AND CORONARY ANGIOGRAPHY N/A 01/06/2017   Procedure: LEFT HEART CATH AND CORONARY ANGIOGRAPHY;  Surgeon: Troy Sine, MD;  Location: [place] [facility];  Service: Cardiovascular;  Laterality: N/A;  . LITHOTRIPSY    . TONSILLECTOMY    . TOTAL HIP ARTHROPLASTY Right 05/15/2018   Procedure: TOTAL HIP ARTHROPLASTY ANTERIOR APPROACH;  Surgeon: Frederik Pear, MD;  Location: [facility];  Service: Orthopedics;  Laterality: Right;    Family History: Family History  Problem Relation Age of Onset  . Uterine cancer Mother   . Diabetes Mellitus II Sister   . Heart attack Brother        Age 43  . Pancreatic cancer Sister     Social History: Social History    Socioeconomic History  . Marital status: Married    Spouse name: Not on file  . Number of children: Not on file  . Years of education: Not on file  . Highest education level: Not on file  Occupational History  . Not on file  Tobacco Use  . Smoking status: Never Smoker  . Smokeless tobacco: Never Used  Vaping Use  . Vaping Use: Never used  Substance and Sexual Activity  . Alcohol use: No  . Drug use: No  . Sexual activity: Yes  Other Topics Concern  . Not on file  Social History Narrative  . Not on file   Social Determinants of Health   Financial Resource Strain:   . Difficulty of Paying Living Expenses:   Food Insecurity:   . Worried About Charity fundraiser in the Last Year:   . Arboriculturist in the Last Year:   Transportation Needs:   . Film/video editor (Medical):   Marland Kitchen Lack of Transportation (Non-Medical):   Physical Activity:   . Days of Exercise per Week:   . Minutes of Exercise per Session:   Stress:   . Feeling of Stress :   Social Connections:   . Frequency of Communication with Friends and Family:   . Frequency of Social Gatherings with Friends and Family:   . Attends Religious Services:   . Active Member of Clubs or Organizations:   . Attends Archivist Meetings:   Marland Kitchen Marital Status:     Allergies:  Allergies  Allergen Reactions  . Cephalosporins Itching    Objective:    Vital Signs:   Temp:  [97.6 F (36.4 C)-98.7 F (37.1 C)] 98.1 F (36.7 C) (07/05 0838) Pulse Rate:  [91-98] 98 (07/05 0307) Resp:  [17-19] 17 (07/05 0838) BP: (120-134)/(61-93) 120/61 (07/05 0838) SpO2:  [95 %-100 %] 98 % (07/05 0838) Weight:  [103.2 kg] 103.2 kg (07/05 0531) Last BM Date: 11/05/19  Weight change: Filed Weights   11/03/19 1328 11/04/19 0325 11/05/19 0531  Weight: 104.7 kg 104.5 kg 103.2 kg    Intake/Output:   Intake/Output Summary (Last 24 hours) at 11/05/2019 1323 Last data filed at 11/05/2019  1047 Gross per 24 hour  Intake 483  ml  Output 2175 ml  Net -1692 ml      Physical Exam    General:  Sitting up in bed. No resp difficulty HEENT: normal Neck: supple. JVP 5-6. Carotids 2+ bilat; no bruits. No lymphadenopathy or thyromegaly appreciated. Cor: PMI nondisplaced. Regular rate & rhythm. + PVCs soft MR Lungs: clear Abdomen: soft, nontender + distended. No hepatosplenomegaly. No bruits or masses. Hypoactive bowel sounds. Extremities: no cyanosis, clubbing, rash, edema  Small blood blister on tip of left 5th finge Neuro: alert & orientedx3, cranial nerves grossly intact. moves all 4 extremities w/o difficulty. Affect pleasant   Telemetry   Sinus 90-100 with frequent PVCs (7-16/min) Personally reviewed  EKG    NSR 91 LBBB (QRS 188m) 1 PVC Personally reviewed   Labs   Basic Metabolic Panel: Recent Labs  Lab 11/03/19 1506 11/03/19 1841 11/04/19 0317 11/05/19 0251  NA 138  --  137 136  K 3.8  --  4.6 5.1  CL 103  --  102 100  CO2 28  --  28 27  GLUCOSE 149*  --  164* 166*  BUN 23  --  22 28*  CREATININE 1.44* 1.52* 1.41* 1.62*  CALCIUM 8.5*  --  8.4* 8.6*    Liver Function Tests: Recent Labs  Lab 11/04/19 0757 11/05/19 0251  AST 15 17  ALT 18 18  ALKPHOS 54 53  BILITOT 0.6 0.9  PROT 7.1 7.2  ALBUMIN 3.0* 3.0*   No results for input(s): LIPASE, AMYLASE in the last 168 hours. No results for input(s): AMMONIA in the last 168 hours.  CBC: Recent Labs  Lab 11/03/19 1506 11/04/19 0317 11/05/19 0251  WBC 9.7 11.6* 15.0*  NEUTROABS  --  8.7*  --   HGB 8.8* 8.5* 9.7*  HCT 30.1* 29.4* 33.0*  MCV 82.5 82.1 82.1  PLT 287 253 328    Cardiac Enzymes: No results for input(s): CKTOTAL, CKMB, CKMBINDEX, TROPONINI in the last 168 hours.  BNP: BNP (last 3 results) Recent Labs    11/05/19 0251  BNP 861.0*    ProBNP (last 3 results) No results for input(s): PROBNP in the last 8760 hours.   CBG: Recent Labs  Lab 11/04/19 1825 11/04/19 2107 11/05/19 0736 11/05/19 1116    GLUCAP 229* 146* 156* 129*    Coagulation Studies: No results for input(s): LABPROT, INR in the last 72 hours.   Imaging   DG Chest 1 View  Result Date: 11/05/2019 CLINICAL DATA:  Shortness of breath EXAM: CHEST  1 VIEW COMPARISON:  11/02/2019 FINDINGS: Cardiac shadow is stable. Vascular congestion is again identified similar to that seen on the prior exam. Some slight increase in the degree of interstitial edema is seen. Small effusions are noted. No bony abnormality is seen. IMPRESSION: Slight increase in vascular congestion with increase in interstitial edema. Electronically Signed   By: MInez CatalinaM.D.   On: 11/05/2019 03:45   DG Abd 1 View  Result Date: 11/05/2019 CLINICAL DATA:  Shortness of breath and abdominal pain EXAM: ABDOMEN - 1 VIEW COMPARISON:  11/02/2019 FINDINGS: Scattered large and small bowel gas is noted. No abnormal mass or abnormal calcifications are seen. Postsurgical changes are noted. No free air is seen. IMPRESSION: No acute abnormality noted. Electronically Signed   By: MInez CatalinaM.D.   On: 11/05/2019 03:45   [place]Abdomen Limited  Result Date: 11/04/2019 CLINICAL DATA:  Assess right upper quadrant and abdomen for ascites.  History of prior cholecystectomy. EXAM: ULTRASOUND ABDOMEN LIMITED RIGHT UPPER QUADRANT COMPARISON:  None. FINDINGS: Gallbladder: The gallbladder is surgically absent. Common bile duct: Diameter: 2.3 mm Liver: No focal lesion identified. Within normal limits in parenchymal echogenicity. Portal vein is patent on color Doppler imaging with normal direction of blood flow towards the liver. Other: No intra-abdominal free fluid is seen. A right pleural effusion is noted. IMPRESSION: 1. Findings consistent with prior cholecystectomy. 2. Right pleural effusion. 3. No evidence of ascites. Electronically Signed   By: Virgina Norfolk M.D.   On: 11/04/2019 15:22   ECHOCARDIOGRAM COMPLETE  Result Date: 11/04/2019    ECHOCARDIOGRAM REPORT   Patient Name:    Christopher Nelson Date of Exam: 11/04/2019 Medical Rec #:  [id number]        Height:       74.0 in Accession #:    [id number]       Weight:       230.3 lb Date of Birth:  [birth year]       BSA:          2.308 m Patient Age:    4 years         BP:           138/95 mmHg Patient Gender: M                HR:           89 bpm. Exam Location:  Inpatient Procedure: 2D Echo, Cardiac Doppler and Color Doppler Indications:    Chest pain  History:        Patient has prior history of Echocardiogram examinations, most                 recent 03/08/2019. CHF, CAD, Mitral Valve Disease; Risk                 Factors:Dyslipidemia. CKD.  Sonographer:    Clayton Lefort RDCS (AE) Referring Phys: [place] Comments: Suboptimal subcostal window. IMPRESSIONS  1. Left ventricular ejection fraction, by estimation, is 25 to 30%. The left ventricle has severely decreased function. The left ventricle demonstrates regional wall motion abnormalities (see scoring diagram/findings for description). There is mild left  ventricular hypertrophy. Left ventricular diastolic parameters are indeterminate.  2. Right ventricular systolic function reduced. The right ventricular size is normal. Tricuspid regurgitation signal is inadequate for assessing PA pressure.  3. Left atrial size was severely dilated.  4. The mitral valve is abnormal, restricted posterior leaflet motion, mild annular calcification. Moderate to severe mitral valve regurgitation, eccentric and two jets noted.  5. The aortic valve is tricuspid. Aortic valve regurgitation is trivial. Mild to moderate aortic valve sclerosis/calcification is present, without any evidence of aortic stenosis.  6. Unable to estimate CVP. FINDINGS  Left Ventricle: Left ventricular ejection fraction, by estimation, is 25 to 30%. The left ventricle has severely decreased function. The left ventricle demonstrates regional wall motion abnormalities. The left ventricular internal cavity size was normal   in size. There is mild left ventricular hypertrophy. Left ventricular diastolic parameters are indeterminate.  LV Wall Scoring: The mid and distal anterior septum, mid inferolateral segment, mid inferoseptal segment, apical inferior segment, and apex are akinetic. The entire anterior wall, inferior wall, basal inferolateral segment, apical lateral segment, and mid anterolateral segment are hypokinetic. The basal anteroseptal segment, basal anterolateral segment, and basal inferoseptal segment are normal. Right Ventricle: The right ventricular size is normal. No increase in right ventricular  wall thickness. Right ventricular systolic function reduced. Tricuspid regurgitation signal is inadequate for assessing PA pressure. Left Atrium: Left atrial size was severely dilated. Right Atrium: Right atrial size was normal in size. Pericardium: There is no evidence of pericardial effusion. Mitral Valve: The mitral valve is abnormal. Mild mitral annular calcification. Moderate to severe mitral valve regurgitation. Tricuspid Valve: The tricuspid valve is grossly normal. Tricuspid valve regurgitation is trivial. Aortic Valve: The aortic valve is tricuspid. Aortic valve regurgitation is trivial. Mild to moderate aortic valve sclerosis/calcification is present, without any evidence of aortic stenosis. Mild aortic valve annular calcification. Aortic valve mean gradient measures 2.7 mmHg. Aortic valve peak gradient measures 4.3 mmHg. Aortic valve area, by VTI measures 3.57 cm. Pulmonic Valve: The pulmonic valve was grossly normal. Pulmonic valve regurgitation is trivial. Aorta: The aortic root is normal in size and structure. Venous: Unable to assess CVP. The inferior vena cava was not well visualized. IAS/Shunts: No atrial level shunt detected by color flow Doppler.  LEFT VENTRICLE PLAX 2D LVIDd:         5.10 cm LVIDs:         4.40 cm LV PW:         2.60 cm LV IVS:        1.70 cm LVOT diam:     2.50 cm LV SV:         62 LV SV  Index:   27 LVOT Area:     4.91 cm  LV Volumes (MOD) LV vol d, MOD A2C: 267.0 ml LV vol d, MOD A4C: 190.0 ml LV vol s, MOD A2C: 169.0 ml LV vol s, MOD A4C: 119.0 ml LV SV MOD A2C:     98.0 ml LV SV MOD A4C:     190.0 ml LV SV MOD BP:      82.5 ml RIGHT VENTRICLE RV Basal diam:  3.80 cm RV Mid diam:    3.70 cm RV S prime:     13.90 cm/s TAPSE (M-mode): 2.6 cm LEFT ATRIUM              Index       RIGHT ATRIUM           Index LA diam:        5.10 cm  2.21 cm/m  RA Area:     20.20 cm LA Vol (A2C):   130.0 ml 56.33 ml/m RA Volume:   71.90 ml  31.16 ml/m LA Vol (A4C):   114.0 ml 49.40 ml/m LA Biplane Vol: 123.0 ml 53.30 ml/m  AORTIC VALVE AV Area (Vmax):    3.23 cm AV Area (Vmean):   3.05 cm AV Area (VTI):     3.57 cm AV Vmax:           103.73 cm/s AV Vmean:          72.067 cm/s AV VTI:            0.173 m AV Peak Grad:      4.3 mmHg AV Mean Grad:      2.7 mmHg LVOT Vmax:         68.30 cm/s LVOT Vmean:        44.733 cm/s LVOT VTI:          0.126 m LVOT/AV VTI ratio: 0.73  AORTA Ao Asc diam: 3.60 cm MR Peak grad:    101.2 mmHg MR Mean grad:    57.0 mmHg   SHUNTS MR Vmax:  503.00 cm/s Systemic VTI:  0.13 m MR Vmean:        345.0 cm/s  Systemic Diam: 2.50 cm MR PISA:         4.02 cm MR PISA Eff ROA: 32 mm MR PISA Radius:  0.80 cm Rozann Lesches MD Electronically signed by Rozann Lesches MD Signature Date/Time: 11/04/2019/2:42:20 PM    Final       Medications:     Current Medications: . vitamin C  1,000 mg Oral Daily  . aspirin EC  81 mg Oral Daily  . atorvastatin  40 mg Oral Daily  . ferrous sulfate  325 mg Oral Q breakfast  . furosemide  40 mg Intravenous BID  . heparin  5,000 Units Subcutaneous Q8H  . insulin aspart  0-9 Units Subcutaneous TID WC  . iohexol  500 mL Oral Q1H  . losartan  25 mg Oral Daily  . pantoprazole  40 mg Oral BID  . polyethylene glycol  17 g Oral BID  . potassium chloride  20 mEq Oral BID  . senna-docusate  1 tablet Oral BID  . sodium chloride flush  3 mL  Intravenous Q12H     Infusions: . sodium chloride         Assessment/Plan   1. Acute systolic HF - ECHO 1/76 EF 55-60% - ECHO 7/21 EF 20% Moderate RV dysfunction mod-severe MR - cath 2018 with totally occluded LAD. Stent to Ramus - Volume status ok. Stop IV lasix for now - Hold losartan until after cath - I suspect this is NICM (?PVCs) but will need eventual R/L cath to further assess - consider cMRI - check viral panel  2. Fevers, anemia, chills - extensive w/u so far negative. Reports fevers at home but so far none here - ESR 55 - Bcx pending - Unifying diagnosis remains unclear.  - I am concerned about possible sub-acute bacterial endocarditis following recent dental visit in [place] although he did have abx prior. Will arrange for TEE tomorrow to assess mitral regurgitation and look for vegetations - Check viral panel - Consider ID input  3. CKD 3b - atrophic left kidney on CT  4. CAD with chest tightness - as noted above status post DES to the ramus intermedius in 2018 with medically managed occluded ostial LAD associated with right and left collaterals and a 90% posterolateral stenosis. - no evidence of ACS  5. Myxomatous mitral valve with history of moderate mitral regurgitation. - TEE tomorrow   6. Frequent PVCs - start carvedilol 3.125 bid - consider mexilitene   Length of Stay: 2  Glori Bickers, MD  11/05/2019, 1:23 PM  Advanced Heart Failure Team Pager [phone] (M-F; 7a - 4p)  Please contact [facility] for night-coverage after hours (4p -7a ) and weekends on [email or web address]

## 2019-11-05 NOTE — Progress Notes (Signed)
Progress Note  Patient Name: Christopher Nelson Date of Encounter: 11/05/2019  Primary Cardiologist: Rozann Lesches, MD  Subjective   Sitting up eating breakfast this morning.  Shortness of breath and orthopnea noted overnight, required additional Lasix with chest x-ray showing interstitial edema.  No chest pain this morning.  Inpatient Medications    Scheduled Meds: . vitamin C  1,000 mg Oral Daily  . aspirin EC  81 mg Oral Daily  . atorvastatin  40 mg Oral Daily  . ferrous sulfate  325 mg Oral Q breakfast  . furosemide  20 mg Intravenous BID  . heparin  5,000 Units Subcutaneous Q8H  . insulin aspart  0-9 Units Subcutaneous TID WC  . losartan  25 mg Oral Daily  . metoprolol tartrate  12.5 mg Oral BID  . pantoprazole  40 mg Oral BID  . polyethylene glycol  17 g Oral BID  . potassium chloride  20 mEq Oral BID  . senna-docusate  1 tablet Oral BID  . sodium chloride flush  3 mL Intravenous Q12H   Continuous Infusions: . sodium chloride     PRN Meds: sodium chloride, acetaminophen, ALPRAZolam, magnesium hydroxide, ondansetron (ZOFRAN) IV, sodium chloride flush, zolpidem   Vital Signs    Vitals:   11/05/19 0210 11/05/19 0307 11/05/19 0524 11/05/19 0531  BP: 130/80 (!) 134/92    Pulse: 91 98    Resp: 19 18    Temp: 98.7 F (37.1 C) 98.4 F (36.9 C)    TempSrc: Oral Oral    SpO2: 98% 100% 97%   Weight:    103.2 kg  Height:        Intake/Output Summary (Last 24 hours) at 11/05/2019 0803 Last data filed at 11/05/2019 0737 Gross per 24 hour  Intake 243 ml  Output 2400 ml  Net -2157 ml   Filed Weights   11/03/19 1328 11/04/19 0325 11/05/19 0531  Weight: 104.7 kg 104.5 kg 103.2 kg    Telemetry    Sinus rhythm with PVCs, brief NSVT. Personally reviewed.  ECG    Tracing from 11/05/2019 shows sinus rhythm with IVCD and leftward axis consistent with incomplete left bundle branch block, PVC.  Physical Exam   GEN:  Elderly male. No acute distress.   Neck:  Increased  JVP. Cardiac: RRR, S3 noted.  Very soft apical murmur.  No rub. Respiratory:  Few scattered crackles at the bases. GI:  Protuberant, bowel sounds present. MS: No edema; No deformity. Neuro:  Nonfocal. Psych: Alert and oriented x 3. Normal affect.  Labs    Chemistry Recent Labs  Lab 11/03/19 1506 11/03/19 1506 11/03/19 1841 11/04/19 0317 11/04/19 0757 11/05/19 0251  NA 138  --   --  137  --  136  K 3.8  --   --  4.6  --  5.1  CL 103  --   --  102  --  100  CO2 28  --   --  28  --  27  GLUCOSE 149*  --   --  164*  --  166*  BUN 23  --   --  22  --  28*  CREATININE 1.44*   < > 1.52* 1.41*  --  1.62*  CALCIUM 8.5*  --   --  8.4*  --  8.6*  PROT  --   --   --   --  7.1 7.2  ALBUMIN  --   --   --   --  3.0* 3.0*  AST  --   --   --   --  15 17  ALT  --   --   --   --  18 18  ALKPHOS  --   --   --   --  54 53  BILITOT  --   --   --   --  0.6 0.9  GFRNONAA 46*   < > 43* 47*  --  40*  GFRAA 54*   < > 50* 55*  --  46*  ANIONGAP 7  --   --  7  --  9   < > = values in this interval not displayed.     Hematology Recent Labs  Lab 11/03/19 1506 11/04/19 0317 11/05/19 0251  WBC 9.7 11.6* 15.0*  RBC 3.65* 3.58* 4.02*  HGB 8.8* 8.5* 9.7*  HCT 30.1* 29.4* 33.0*  MCV 82.5 82.1 82.1  MCH 24.1* 23.7* 24.1*  MCHC 29.2* 28.9* 29.4*  RDW 15.0 14.9 15.1  PLT 287 253 328    Cardiac Enzymes Recent Labs  Lab 11/03/19 1506  TROPONINIHS 29*    Radiology    DG Chest 1 View  Result Date: 11/05/2019 CLINICAL DATA:  Shortness of breath EXAM: CHEST  1 VIEW COMPARISON:  11/02/2019 FINDINGS: Cardiac shadow is stable. Vascular congestion is again identified similar to that seen on the prior exam. Some slight increase in the degree of interstitial edema is seen. Small effusions are noted. No bony abnormality is seen. IMPRESSION: Slight increase in vascular congestion with increase in interstitial edema. Electronically Signed   By: Inez Catalina M.D.   On: 11/05/2019 03:45   DG Abd 1  View  Result Date: 11/05/2019 CLINICAL DATA:  Shortness of breath and abdominal pain EXAM: ABDOMEN - 1 VIEW COMPARISON:  11/02/2019 FINDINGS: Scattered large and small bowel gas is noted. No abnormal mass or abnormal calcifications are seen. Postsurgical changes are noted. No free air is seen. IMPRESSION: No acute abnormality noted. Electronically Signed   By: Inez Catalina M.D.   On: 11/05/2019 03:45   US Abdomen Limited  Result Date: 11/04/2019 CLINICAL DATA:  Assess right upper quadrant and abdomen for ascites. History of prior cholecystectomy. EXAM: ULTRASOUND ABDOMEN LIMITED RIGHT UPPER QUADRANT COMPARISON:  None. FINDINGS: Gallbladder: The gallbladder is surgically absent. Common bile duct: Diameter: 2.3 mm Liver: No focal lesion identified. Within normal limits in parenchymal echogenicity. Portal vein is patent on color Doppler imaging with normal direction of blood flow towards the liver. Other: No intra-abdominal free fluid is seen. A right pleural effusion is noted. IMPRESSION: 1. Findings consistent with prior cholecystectomy. 2. Right pleural effusion. 3. No evidence of ascites. Electronically Signed   By: Virgina Norfolk M.D.   On: 11/04/2019 15:22   ECHOCARDIOGRAM COMPLETE  Result Date: 11/04/2019    ECHOCARDIOGRAM REPORT   Patient Name:   Christopher Nelson Date of Exam: 11/04/2019 Medical Rec #:  [id number]        Height:       74.0 in Accession #:    [id number]       Weight:       230.3 lb Date of Birth:  04/12/1941       BSA:          2.308 m Patient Age:    79 years         BP:           138/95 mmHg Patient Gender: M  HR:           89 bpm. Exam Location:  Inpatient Procedure: 2D Echo, Cardiac Doppler and Color Doppler Indications:    Chest pain  History:        Patient has prior history of Echocardiogram examinations, most                 recent 03/08/2019. CHF, CAD, Mitral Valve Disease; Risk                 Factors:Dyslipidemia. CKD.  Sonographer:    Clayton Lefort RDCS (AE)  Referring Phys: [place] Comments: Suboptimal subcostal window. IMPRESSIONS  1. Left ventricular ejection fraction, by estimation, is 25 to 30%. The left ventricle has severely decreased function. The left ventricle demonstrates regional wall motion abnormalities (see scoring diagram/findings for description). There is mild left  ventricular hypertrophy. Left ventricular diastolic parameters are indeterminate.  2. Right ventricular systolic function reduced. The right ventricular size is normal. Tricuspid regurgitation signal is inadequate for assessing PA pressure.  3. Left atrial size was severely dilated.  4. The mitral valve is abnormal, restricted posterior leaflet motion, mild annular calcification. Moderate to severe mitral valve regurgitation, eccentric and two jets noted.  5. The aortic valve is tricuspid. Aortic valve regurgitation is trivial. Mild to moderate aortic valve sclerosis/calcification is present, without any evidence of aortic stenosis.  6. Unable to estimate CVP. FINDINGS  Left Ventricle: Left ventricular ejection fraction, by estimation, is 25 to 30%. The left ventricle has severely decreased function. The left ventricle demonstrates regional wall motion abnormalities. The left ventricular internal cavity size was normal  in size. There is mild left ventricular hypertrophy. Left ventricular diastolic parameters are indeterminate.  LV Wall Scoring: The mid and distal anterior septum, mid inferolateral segment, mid inferoseptal segment, apical inferior segment, and apex are akinetic. The entire anterior wall, inferior wall, basal inferolateral segment, apical lateral segment, and mid anterolateral segment are hypokinetic. The basal anteroseptal segment, basal anterolateral segment, and basal inferoseptal segment are normal. Right Ventricle: The right ventricular size is normal. No increase in right ventricular wall thickness. Right ventricular systolic function reduced.  Tricuspid regurgitation signal is inadequate for assessing PA pressure. Left Atrium: Left atrial size was severely dilated. Right Atrium: Right atrial size was normal in size. Pericardium: There is no evidence of pericardial effusion. Mitral Valve: The mitral valve is abnormal. Mild mitral annular calcification. Moderate to severe mitral valve regurgitation. Tricuspid Valve: The tricuspid valve is grossly normal. Tricuspid valve regurgitation is trivial. Aortic Valve: The aortic valve is tricuspid. Aortic valve regurgitation is trivial. Mild to moderate aortic valve sclerosis/calcification is present, without any evidence of aortic stenosis. Mild aortic valve annular calcification. Aortic valve mean gradient measures 2.7 mmHg. Aortic valve peak gradient measures 4.3 mmHg. Aortic valve area, by VTI measures 3.57 cm. Pulmonic Valve: The pulmonic valve was grossly normal. Pulmonic valve regurgitation is trivial. Aorta: The aortic root is normal in size and structure. Venous: Unable to assess CVP. The inferior vena cava was not well visualized. IAS/Shunts: No atrial level shunt detected by color flow Doppler.  LEFT VENTRICLE PLAX 2D LVIDd:         5.10 cm LVIDs:         4.40 cm LV PW:         2.60 cm LV IVS:        1.70 cm LVOT diam:     2.50 cm LV SV:  62 LV SV Index:   27 LVOT Area:     4.91 cm  LV Volumes (MOD) LV vol d, MOD A2C: 267.0 ml LV vol d, MOD A4C: 190.0 ml LV vol s, MOD A2C: 169.0 ml LV vol s, MOD A4C: 119.0 ml LV SV MOD A2C:     98.0 ml LV SV MOD A4C:     190.0 ml LV SV MOD BP:      82.5 ml RIGHT VENTRICLE RV Basal diam:  3.80 cm RV Mid diam:    3.70 cm RV S prime:     13.90 cm/s TAPSE (M-mode): 2.6 cm LEFT ATRIUM              Index       RIGHT ATRIUM           Index LA diam:        5.10 cm  2.21 cm/m  RA Area:     20.20 cm LA Vol (A2C):   130.0 ml 56.33 ml/m RA Volume:   71.90 ml  31.16 ml/m LA Vol (A4C):   114.0 ml 49.40 ml/m LA Biplane Vol: 123.0 ml 53.30 ml/m  AORTIC VALVE AV Area  (Vmax):    3.23 cm AV Area (Vmean):   3.05 cm AV Area (VTI):     3.57 cm AV Vmax:           103.73 cm/s AV Vmean:          72.067 cm/s AV VTI:            0.173 m AV Peak Grad:      4.3 mmHg AV Mean Grad:      2.7 mmHg LVOT Vmax:         68.30 cm/s LVOT Vmean:        44.733 cm/s LVOT VTI:          0.126 m LVOT/AV VTI ratio: 0.73  AORTA Ao Asc diam: 3.60 cm MR Peak grad:    101.2 mmHg MR Mean grad:    57.0 mmHg   SHUNTS MR Vmax:         503.00 cm/s Systemic VTI:  0.13 m MR Vmean:        345.0 cm/s  Systemic Diam: 2.50 cm MR PISA:         4.02 cm MR PISA Eff ROA: 32 mm MR PISA Radius:  0.80 cm Rozann Lesches MD Electronically signed by Rozann Lesches MD Signature Date/Time: 11/04/2019/2:42:20 PM    Final     Patient Profile     79 y.o. 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 He presents in transfer from [facility] for evaluation of chest pain.  Assessment & Plan    1.  Ischemic cardiomyopathy with follow-up echocardiogram showing deterioration in LVEF to the range of 25 to 30% (was 55 to 60% in November 2020).  Also reduced RV contraction.  He has acute systolic heart failure, high-sensitivity troponin I levels are not suggestive of ACS although he has had some intermittent chest tightness.  Additional Lasix given overnight due to shortness of breath with orthopnea, chest x-ray showing interstitial edema and increased vascular congestion.  Currently on IV Lasix with potassium supplement,  losartan, and Lopressor.  2. Multivessel CAD as noted above status post DES to the ramus intermedius in 2018 with medically managed occluded ostial LAD associated with right and left  collaterals and a 90% posterolateral stenosis.  He is on aspirin and Lipitor.  3. Chronic anemia, possibly multifactorial. He does have a history of gastritis and gastric ulcer in April by EGD in the setting of NSAIDs. He does not describe any recent stool changes. Does report recurring abdominal pain and bloating.  Abdominal plain film and ultrasound showed no acute findings.  States that he had 2 recent bowel movements.  Hemoglobin up to 9.7 this morning.  4. Reportedly recurring fevers, states that this tends to occur in the evenings after dinner, he reports measured temperature at home up to 103 degrees. HIV and SARS coronavirus 2 test negative. He has been afebrile under observation at this facility so far. Internal medicine consulted.  ESR 55.  WBC 11.6 up to 15.0.  5. CKD stage IIIb, creatinine 1.41 up to 1.62.  6.  Moderate to severe eccentric mitral regurgitation made up of 2 jets, posterior leaflet appears restricted, not prolapsing at this time.  Discussed interval findings with Mr. Koepp today.  I have asked Dr. Haroldine Laws to see the patient for further evaluation on the heart failure team.  I will try to increase his IV Lasix dose somewhat, hold off on Aldactone for now with increasing creatinine, supplement potassium.  Hold low-dose Lopressor with eventually plan to switch to Coreg once volume status is further optimized and his hemodynamics are understood.  Continue losartan for now.  Suspect he is going to need a right and left heart catheterization, possibly even TEE to understand all of his treatment options.  He is reasonably functional at baseline, still works.  The elevated ESR with increasing leukocytosis is troubling, but he is not febrile at this time, it would be best to check blood and urine cultures with a fever spike, although it may be worth going ahead and sending a few sets.  Signed, Rozann Lesches, MD  11/05/2019, 8:03 AM

## 2019-11-05 NOTE — Progress Notes (Signed)
Inpatient Diabetes Program Recommendations  AACE/ADA: New Consensus Statement on Inpatient Glycemic Control (2015)  Target Ranges:  Prepandial:   less than 140 mg/dL      Peak postprandial:   less than 180 mg/dL (1-2 hours)      Critically ill patients:  140 - 180 mg/dL   Lab Results  Component Value Date   GLUCAP 129 (H) 11/05/2019   HGBA1C 7.9 (H) 11/04/2019    Review of Glycemic Control Results for Christopher Nelson, Christopher Nelson (MRN [id number]) as of 11/05/2019 13:15  Ref. Range 11/04/2019 18:25 11/04/2019 21:07 11/05/2019 07:36 11/05/2019 11:16  Glucose-Capillary Latest Ref Range: 70 - 99 mg/dL 229 (H) 146 (H) 156 (H) 129 (H)   Diabetes history:  New onset DM2  Outpatient Diabetes medications:  None  Current orders for Inpatient glycemic control:  Novolog 0-9 units tid  Note:   Spoke with pt and wife at bedside about new diagnosis. Discussed A1C results with them and explained what an A1C is, basic pathophysiology of DM Type 2, basic home care, basic diabetes diet nutrition principles, importance of checking CBGs and maintaining good CBG control to prevent long-term and short-term complications. Reviewed signs and symptoms of hyperglycemia and hypoglycemia and how to treat hypoglycemia at home. Also reviewed blood sugar goals at home.  RNs to provide ongoing basic DM education at bedside with this patient. Have ordered educational booklet,  and DM videos. Have also placed RD consult for DM diet education for this patient.   He has a glucometer at home as he has been told in the past he has pre-diabetes.  Asked him to check his cbg in the morning prior to eating and bring meter with him to PCP appointments.    He does not drink any beverages that contain sugar.  We discussed CHO's at length, which food contain CHO's and goal CHO of 50-60 grams per meal.  He states he will make diet adjustments for better glucose control.    Will continue to follow while inpatient.  Thank you, Reche Dixon, RN,  BSN Diabetes Coordinator Inpatient Diabetes Program [phone] (team pager from 8a-5p)

## 2019-11-05 NOTE — Care Management (Signed)
11-05-19 Patient presented for chest pain. Case Manager received consult for [place]. Prior to arrival patient was independent from home with spouse. Per spouse patient still drives and works. Patient uses [facility] on [address] in [place]. Patient sees a primary care provider at [facility]. No home needs identified at this time. Bethena Roys, RN,BSN Case Manager

## 2019-11-06 ENCOUNTER — Encounter (HOSPITAL_COMMUNITY)
Admission: AD | Disposition: A | Discharge status: Home / Self Care | Payer: Self-pay | Source: Other Acute Inpatient Hospital | Attending: Internal Medicine

## 2019-11-06 ENCOUNTER — Encounter (HOSPITAL_COMMUNITY): Payer: Self-pay | Admitting: Internal Medicine

## 2019-11-06 ENCOUNTER — Inpatient Hospital Stay (HOSPITAL_COMMUNITY): Payer: Medicare Other | Admitting: Certified Registered Nurse Anesthetist

## 2019-11-06 ENCOUNTER — Inpatient Hospital Stay (HOSPITAL_COMMUNITY): Payer: Medicare Other

## 2019-11-06 DIAGNOSIS — I348 Other nonrheumatic mitral valve disorders: Secondary | ICD-10-CM

## 2019-11-06 DIAGNOSIS — I340 Nonrheumatic mitral (valve) insufficiency: Secondary | ICD-10-CM

## 2019-11-06 HISTORY — PX: TEE WITHOUT CARDIOVERSION: SHX5443

## 2019-11-06 LAB — GLUCOSE, CAPILLARY
Glucose-Capillary: 161 mg/dL — ABNORMAL HIGH (ref 70–99)
Glucose-Capillary: 138 mg/dL — ABNORMAL HIGH (ref 70–99)
Glucose-Capillary: 207 mg/dL — ABNORMAL HIGH (ref 70–99)
Glucose-Capillary: 147 mg/dL — ABNORMAL HIGH (ref 70–99)

## 2019-11-06 LAB — URINE CULTURE: Culture: <10000 — AB

## 2019-11-06 LAB — CBC
HCT: 31.8 % — ABNORMAL LOW (ref 39.0–52.0)
Hemoglobin: 9.5 g/dL — ABNORMAL LOW (ref 13.0–17.0)
MCH: 24.1 pg — ABNORMAL LOW (ref 26.0–34.0)
MCHC: 29.9 g/dL — ABNORMAL LOW (ref 30.0–36.0)
MCV: 80.7 fL (ref 80.0–100.0)
Platelets: 335 10*3/uL (ref 150–400)
RBC: 3.94 MIL/uL — ABNORMAL LOW (ref 4.22–5.81)
RDW: 15.2 % (ref 11.5–15.5)
WBC: 10.9 10*3/uL — ABNORMAL HIGH (ref 4.0–10.5)
nRBC: 0 % (ref 0.0–0.2)

## 2019-11-06 LAB — BASIC METABOLIC PANEL
Anion gap: 11 (ref 5–15)
BUN: 25 mg/dL — ABNORMAL HIGH (ref 8–23)
CO2: 25 mmol/L (ref 22–32)
Calcium: 8.7 mg/dL — ABNORMAL LOW (ref 8.9–10.3)
Chloride: 97 mmol/L — ABNORMAL LOW (ref 98–111)
Creatinine, Ser: 1.33 mg/dL — ABNORMAL HIGH (ref 0.61–1.24)
GFR calc Af Amer: 59 mL/min — ABNORMAL LOW (ref >60)
GFR calc non Af Amer: 51 mL/min — ABNORMAL LOW (ref >60)
Glucose, Bld: 159 mg/dL — ABNORMAL HIGH (ref 70–99)
Potassium: 4.7 mmol/L (ref 3.5–5.1)
Sodium: 133 mmol/L — ABNORMAL LOW (ref 135–145)

## 2019-11-06 SURGERY — ECHOCARDIOGRAM, TRANSESOPHAGEAL
Anesthesia: Monitor Anesthesia Care

## 2019-11-06 MED ORDER — BUTAMBEN-TETRACAINE-BENZOCAINE 2-2-14 % EX AERO
INHALATION_SPRAY | CUTANEOUS | Status: DC | PRN
  Administered 2019-11-06: 2 via TOPICAL

## 2019-11-06 MED ORDER — PROPOFOL 10 MG/ML IV BOLUS
INTRAVENOUS | Status: DC | PRN
  Administered 2019-11-06: 20 mg via INTRAVENOUS

## 2019-11-06 MED ORDER — SODIUM CHLORIDE 0.9 % IV SOLN: INTRAVENOUS | Status: DC

## 2019-11-06 MED ORDER — PROPOFOL 500 MG/50ML IV EMUL
INTRAVENOUS | Status: DC | PRN
  Administered 2019-11-06: 75 ug/kg/min via INTRAVENOUS

## 2019-11-06 MED ORDER — LACTATED RINGERS IV SOLN
INTRAVENOUS | Status: DC
  Administered 2019-11-06: 1000 mL via INTRAVENOUS

## 2019-11-06 MED ORDER — CARVEDILOL 6.25 MG PO TABS
6.2500 mg | ORAL_TABLET | Freq: Two times a day (BID) | ORAL | Status: DC
  Administered 2019-11-06 – 2019-11-07 (×2): 6.25 mg via ORAL
  Filled 2019-11-06 (×3): qty 1

## 2019-11-06 NOTE — Anesthesia Preprocedure Evaluation (Signed)
Anesthesia Evaluation  Patient identified by MRN, date of birth, ID band Patient awake    Reviewed: Allergy & Precautions, NPO status , Patient's Chart, lab work & pertinent test results  Airway Mallampati: II  TM Distance: >3 FB Neck ROM: Full    Dental  (+) Teeth Intact, Dental Advisory Given   Pulmonary neg pulmonary ROS,    Pulmonary exam normal breath sounds clear to auscultation       Cardiovascular hypertension, + angina + CAD, + Cardiac Stents and +CHF  Normal cardiovascular exam+ Valvular Problems/Murmurs MR  Rhythm:Regular Rate:Normal  CAD with DES to Ramus intermediate 01/2017, total ostial occlusion of LAD, irregularity of OM1 vessel without high grade stenosis and calcification diffusely in RCA with 20% narrowing and luminal irregularities throughout with 90%  PL stenosis in a small distal vessel.  There is collateralization to the distal to mid LAD via the distal RCA  TTE 11/2019 . Left ventricular ejection fraction, by estimation, is 25 to 30%. The left ventricle has severely decreased function. The left ventricle demonstrates regional wall motion abnormalities (see scoring diagram/findings for description). There is mild leftventricular hypertrophy. Left ventricular diastolic parameters are indeterminate.  2. Right ventricular systolic function reduced. The right ventricular size is normal. Tricuspid regurgitation signal is inadequate for assessing PA pressure.  3. Left atrial size was severely dilated.  4. The mitral valve is abnormal, restricted posterior leaflet motion, mild annular calcification. Moderate to severe mitral valve regurgitation, eccentric and two jets noted.  5. The aortic valve is tricuspid. Aortic valve regurgitation is trivial. Mild to moderate aortic valve sclerosis/calcification is present, without any evidence of aortic stenosis.  6. Unable to estimate CVP.    Neuro/Psych negative neurological  ROS  negative psych ROS   GI/Hepatic Neg liver ROS, GERD  ,  Endo/Other  negative endocrine ROS  Renal/GU Renal InsufficiencyRenal disease  negative genitourinary   Musculoskeletal negative musculoskeletal ROS (+)   Abdominal   Peds  Hematology  (+) Blood dyscrasia (Hgb 9.5), anemia ,   Anesthesia Other Findings   Reproductive/Obstetrics                             Anesthesia Physical Anesthesia Plan  ASA: IV  Anesthesia Plan: MAC   Post-op Pain Management:    Induction: Intravenous  PONV Risk Score and Plan: 1 and Propofol infusion and Treatment may vary due to age or medical condition  Airway Management Planned: Natural Airway  Additional Equipment:   Intra-op Plan:   Post-operative Plan:   Informed Consent: I have reviewed the patients History and Physical, chart, labs and discussed the procedure including the risks, benefits and alternatives for the proposed anesthesia with the patient or authorized representative who has indicated his/her understanding and acceptance.     Dental advisory given  Plan Discussed with: CRNA  Anesthesia Plan Comments:         Anesthesia Quick Evaluation

## 2019-11-06 NOTE — Progress Notes (Signed)
Nutrition Education Note  RD consulted for nutrition education regarding CHF and diabetes.  Lab Results  Component Value Date   HGBA1C 7.9 (H) 11/04/2019   PTA DM medications are none.   Labs reviewed: Na: 133, CBGS: 138-161 (inpatient orders for glycemic control are 0-9 units insulin aspart TID with meals).   Spoke with pt, who was pacing around the room at time of visit. He reports he is very anxious for his test and eager to eat. Wife also at bedside. Both recall seeing DM coordinator earlier. Pt not very receptive to speaking with this RD, due to anxiety of upcoming procedure ("if my heart stops, my diabetes won't matter"). RD used reflective listening to help put pt at ease.   Spoke with wife, who confirmed that she prepares meals for pt. She did ot have any further questions for RD at this time.   RD provided "Heart Healthy, Consistent Carbohydrate Nutrition Therapy" handout from the Academy of [employer] Dietetics. Handout attached to AVS/ discharge summary.   Current diet order is Heart Healthy, patient is consuming approximately 75-100% of meals at this time. Labs and medications reviewed. No further nutrition interventions warranted at this time. RD contact information provided. If additional nutrition issues arise, please re-consult RD.   Loistine Chance, RD, LDN, [place] Registered Dietitian II Certified Diabetes Care and Education Specialist Please refer to [facility] for RD and/or RD on-call/weekend/after hours pager

## 2019-11-06 NOTE — H&P (View-Only) (Signed)
Progress Note  Patient Name: Christopher Nelson Date of Encounter: 11/06/2019  [facility] Cardiologist: Rozann Lesches, MD   Subjective   Woke up today 3 AM, hard to get back to sleep.  Sometimes feels as though he needs to take a deep breath.  No chest pain.  No fever since being here.  Inpatient Medications    Scheduled Meds: . vitamin C  1,000 mg Oral Daily  . aspirin EC  81 mg Oral Daily  . atorvastatin  40 mg Oral Daily  . carvedilol  3.125 mg Oral BID WC  . ferrous sulfate  325 mg Oral Q breakfast  . heparin  5,000 Units Subcutaneous Q8H  . insulin aspart  0-9 Units Subcutaneous TID WC  . pantoprazole  40 mg Oral BID  . polyethylene glycol  17 g Oral BID  . senna-docusate  1 tablet Oral BID  . sodium chloride flush  3 mL Intravenous Q12H  . zolpidem  5 mg Oral QHS   Continuous Infusions: . sodium chloride    . sodium chloride     PRN Meds: sodium chloride, acetaminophen, ALPRAZolam, magnesium hydroxide, ondansetron (ZOFRAN) IV, sodium chloride flush   Vital Signs    Vitals:   11/05/19 0838 11/05/19 1518 11/05/19 2101 11/06/19 0421  BP: 120/61 (!) 156/91 (!) 153/83 (!) 152/95  Pulse:  94 (!) 107 98  Resp: '17 18 16 16  ' Temp: 98.1 F (36.7 C) 98.3 F (36.8 C) 97.7 F (36.5 C) 98.3 F (36.8 C)  TempSrc: Oral Oral Oral Oral  SpO2: 98% 98% 96% 93%  Weight:    101.2 kg  Height:        Intake/Output Summary (Last 24 hours) at 11/06/2019 0809 Last data filed at 11/06/2019 0600 Gross per 24 hour  Intake 1363 ml  Output 1325 ml  Net 38 ml   Last 3 Weights 11/06/2019 11/05/2019 11/04/2019  Weight (lbs) 223 lb 3.2 oz 227 lb 8 oz 230 lb 4.8 oz  Weight (kg) 101.243 kg 103.193 kg 104.463 kg      Telemetry    Sinus 80s to 100s-PVCs noted but improved- Personally Reviewed  ECG    Sinus rhythm left bundle branch block 91 bpm, isolated PVC, QRS duration 131 ms- Personally Reviewed  Physical Exam   GEN: No acute distress.   Neck: No JVD Cardiac: RRR, 1/6 HSM,  no rubs, or gallops.  Respiratory: Clear to auscultation bilaterally. GI: Soft, nontender, non-distended  MS: No edema; No deformity. Neuro:  Nonfocal  Psych: Normal affect   Labs    High Sensitivity Troponin:   Recent Labs  Lab 11/03/19 1506  TROPONINIHS 29*      Chemistry Recent Labs  Lab 11/04/19 0317 11/04/19 0757 11/05/19 0251 11/06/19 0333  NA 137  --  136 133*  K 4.6  --  5.1 4.7  CL 102  --  100 97*  CO2 28  --  27 25  GLUCOSE 164*  --  166* 159*  BUN 22  --  28* 25*  CREATININE 1.41*  --  1.62* 1.33*  CALCIUM 8.4*  --  8.6* 8.7*  PROT  --  7.1 7.2  --   ALBUMIN  --  3.0* 3.0*  --   AST  --  15 17  --   ALT  --  18 18  --   ALKPHOS  --  54 53  --   BILITOT  --  0.6 0.9  --   GFRNONAA 47*  --  40* 51*  GFRAA 55*  --  46* 59*  ANIONGAP 7  --  9 11     Hematology Recent Labs  Lab 11/03/19 1506 11/04/19 0317 11/05/19 0251  WBC 9.7 11.6* 15.0*  RBC 3.65* 3.58* 4.02*  HGB 8.8* 8.5* 9.7*  HCT 30.1* 29.4* 33.0*  MCV 82.5 82.1 82.1  MCH 24.1* 23.7* 24.1*  MCHC 29.2* 28.9* 29.4*  RDW 15.0 14.9 15.1  PLT 287 253 328    BNP Recent Labs  Lab 11/05/19 0251  BNP 861.0*     DDimer No results for input(s): DDIMER in the last 168 hours.   Radiology    CT ABDOMEN PELVIS WO CONTRAST  Result Date: 11/05/2019 CLINICAL DATA:  Abdominal distention and fever. EXAM: CT ABDOMEN AND PELVIS WITHOUT CONTRAST TECHNIQUE: Multidetector CT imaging of the abdomen and pelvis was performed following the standard protocol without IV contrast. COMPARISON:  08/30/2019 FINDINGS: Lower chest: Small to moderate bilateral pleural effusions with associated bibasilar atelectasis which is new. Calcified plaque over the right coronary artery as well as the thoracic aorta. Hepatobiliary: Previous cholecystectomy. Liver and biliary tree are normal. Pancreas: Normal. Spleen: Normal. Adrenals/Urinary Tract: Stable subcentimeter left adrenal nodule likely an adenoma. Right adrenal gland is  normal. Stable left renal atrophy with small stone over the lower pole. Stable prominence of the left renal pelvis. Mild stable prominence of the right intrarenal collecting system. No evidence of right renal stones. Stable exophytic right renal cyst. Right ureter is normal. There is a 9 mm stone over the bladder which has moved from the midline to the region of the right UVJ. Stomach/Bowel: Stomach and small bowel are normal. Appendix is normal. Moderate diverticulosis of the colon most prominent over the descending and sigmoid colon. No active inflammation. Vascular/Lymphatic: Moderate calcified plaque of abdominal aorta which is normal in caliber. No adenopathy. Reproductive: Stable prominence of the prostate gland and seminal vesicles. Other: No significant free fluid.  No free peritoneal air. Musculoskeletal: Degenerative change of the spine and left hip. Right hip prosthesis intact. IMPRESSION: 1.  No acute findings in the abdomen/pelvis. 2. New small to moderate bilateral pleural effusions with associated bibasilar atelectasis. 3. Chronic left renal atrophy with nonobstructing left renal stone. Mild stable prominence of the right intrarenal collecting system and stable right renal cyst. 9 mm bladder stone has moved from the midline to the region of the right UVJ. 4.  Moderate colonic diverticulosis without active inflammation. 5. Aortic Atherosclerosis (ICD10-I70.0). Atherosclerotic coronary artery disease. 6.  Stable prominence of the prostate gland and seminal vesicles. Electronically Signed   By: Marin Olp M.D.   On: 11/05/2019 17:14   DG Chest 1 View  Result Date: 11/05/2019 CLINICAL DATA:  Shortness of breath EXAM: CHEST  1 VIEW COMPARISON:  11/02/2019 FINDINGS: Cardiac shadow is stable. Vascular congestion is again identified similar to that seen on the prior exam. Some slight increase in the degree of interstitial edema is seen. Small effusions are noted. No bony abnormality is seen. IMPRESSION:  Slight increase in vascular congestion with increase in interstitial edema. Electronically Signed   By: Inez Catalina M.D.   On: 11/05/2019 03:45   DG Abd 1 View  Result Date: 11/05/2019 CLINICAL DATA:  Shortness of breath and abdominal pain EXAM: ABDOMEN - 1 VIEW COMPARISON:  11/02/2019 FINDINGS: Scattered large and small bowel gas is noted. No abnormal mass or abnormal calcifications are seen. Postsurgical changes are noted. No free air is seen. IMPRESSION: No acute abnormality noted. Electronically  Signed   By: Inez Catalina M.D.   On: 11/05/2019 03:45   US Abdomen Limited  Result Date: 11/04/2019 CLINICAL DATA:  Assess right upper quadrant and abdomen for ascites. History of prior cholecystectomy. EXAM: ULTRASOUND ABDOMEN LIMITED RIGHT UPPER QUADRANT COMPARISON:  None. FINDINGS: Gallbladder: The gallbladder is surgically absent. Common bile duct: Diameter: 2.3 mm Liver: No focal lesion identified. Within normal limits in parenchymal echogenicity. Portal vein is patent on color Doppler imaging with normal direction of blood flow towards the liver. Other: No intra-abdominal free fluid is seen. A right pleural effusion is noted. IMPRESSION: 1. Findings consistent with prior cholecystectomy. 2. Right pleural effusion. 3. No evidence of ascites. Electronically Signed   By: Virgina Norfolk M.D.   On: 11/04/2019 15:22   ECHOCARDIOGRAM COMPLETE  Result Date: 11/04/2019    ECHOCARDIOGRAM REPORT   Patient Name:   PHARAOH PIO Date of Exam: 11/04/2019 Medical Rec #:  [id number]        Height:       74.0 in Accession #:    [id number]       Weight:       230.3 lb Date of Birth:  1940-10-14       BSA:          2.308 m Patient Age:    89 years         BP:           138/95 mmHg Patient Gender: M                HR:           89 bpm. Exam Location:  Inpatient Procedure: 2D Echo, Cardiac Doppler and Color Doppler Indications:    Chest pain  History:        Patient has prior history of Echocardiogram examinations, most                  recent 03/08/2019. CHF, CAD, Mitral Valve Disease; Risk                 Factors:Dyslipidemia. CKD.  Sonographer:    Clayton Lefort RDCS (AE) Referring Phys: [place] Comments: Suboptimal subcostal window. IMPRESSIONS  1. Left ventricular ejection fraction, by estimation, is 25 to 30%. The left ventricle has severely decreased function. The left ventricle demonstrates regional wall motion abnormalities (see scoring diagram/findings for description). There is mild left  ventricular hypertrophy. Left ventricular diastolic parameters are indeterminate.  2. Right ventricular systolic function reduced. The right ventricular size is normal. Tricuspid regurgitation signal is inadequate for assessing PA pressure.  3. Left atrial size was severely dilated.  4. The mitral valve is abnormal, restricted posterior leaflet motion, mild annular calcification. Moderate to severe mitral valve regurgitation, eccentric and two jets noted.  5. The aortic valve is tricuspid. Aortic valve regurgitation is trivial. Mild to moderate aortic valve sclerosis/calcification is present, without any evidence of aortic stenosis.  6. Unable to estimate CVP. FINDINGS  Left Ventricle: Left ventricular ejection fraction, by estimation, is 25 to 30%. The left ventricle has severely decreased function. The left ventricle demonstrates regional wall motion abnormalities. The left ventricular internal cavity size was normal  in size. There is mild left ventricular hypertrophy. Left ventricular diastolic parameters are indeterminate.  LV Wall Scoring: The mid and distal anterior septum, mid inferolateral segment, mid inferoseptal segment, apical inferior segment, and apex are akinetic. The entire anterior wall, inferior wall, basal inferolateral segment, apical lateral segment,  and mid anterolateral segment are hypokinetic. The basal anteroseptal segment, basal anterolateral segment, and basal inferoseptal segment are normal.  Right Ventricle: The right ventricular size is normal. No increase in right ventricular wall thickness. Right ventricular systolic function reduced. Tricuspid regurgitation signal is inadequate for assessing PA pressure. Left Atrium: Left atrial size was severely dilated. Right Atrium: Right atrial size was normal in size. Pericardium: There is no evidence of pericardial effusion. Mitral Valve: The mitral valve is abnormal. Mild mitral annular calcification. Moderate to severe mitral valve regurgitation. Tricuspid Valve: The tricuspid valve is grossly normal. Tricuspid valve regurgitation is trivial. Aortic Valve: The aortic valve is tricuspid. Aortic valve regurgitation is trivial. Mild to moderate aortic valve sclerosis/calcification is present, without any evidence of aortic stenosis. Mild aortic valve annular calcification. Aortic valve mean gradient measures 2.7 mmHg. Aortic valve peak gradient measures 4.3 mmHg. Aortic valve area, by VTI measures 3.57 cm. Pulmonic Valve: The pulmonic valve was grossly normal. Pulmonic valve regurgitation is trivial. Aorta: The aortic root is normal in size and structure. Venous: Unable to assess CVP. The inferior vena cava was not well visualized. IAS/Shunts: No atrial level shunt detected by color flow Doppler.  LEFT VENTRICLE PLAX 2D LVIDd:         5.10 cm LVIDs:         4.40 cm LV PW:         2.60 cm LV IVS:        1.70 cm LVOT diam:     2.50 cm LV SV:         62 LV SV Index:   27 LVOT Area:     4.91 cm  LV Volumes (MOD) LV vol d, MOD A2C: 267.0 ml LV vol d, MOD A4C: 190.0 ml LV vol s, MOD A2C: 169.0 ml LV vol s, MOD A4C: 119.0 ml LV SV MOD A2C:     98.0 ml LV SV MOD A4C:     190.0 ml LV SV MOD BP:      82.5 ml RIGHT VENTRICLE RV Basal diam:  3.80 cm RV Mid diam:    3.70 cm RV S prime:     13.90 cm/s TAPSE (M-mode): 2.6 cm LEFT ATRIUM              Index       RIGHT ATRIUM           Index LA diam:        5.10 cm  2.21 cm/m  RA Area:     20.20 cm LA Vol (A2C):   130.0 ml  56.33 ml/m RA Volume:   71.90 ml  31.16 ml/m LA Vol (A4C):   114.0 ml 49.40 ml/m LA Biplane Vol: 123.0 ml 53.30 ml/m  AORTIC VALVE AV Area (Vmax):    3.23 cm AV Area (Vmean):   3.05 cm AV Area (VTI):     3.57 cm AV Vmax:           103.73 cm/s AV Vmean:          72.067 cm/s AV VTI:            0.173 m AV Peak Grad:      4.3 mmHg AV Mean Grad:      2.7 mmHg LVOT Vmax:         68.30 cm/s LVOT Vmean:        44.733 cm/s LVOT VTI:          0.126 m LVOT/AV VTI ratio: 0.73  AORTA Ao Asc diam: 3.60 cm MR Peak grad:    101.2 mmHg MR Mean grad:    57.0 mmHg   SHUNTS MR Vmax:         503.00 cm/s Systemic VTI:  0.13 m MR Vmean:        345.0 cm/s  Systemic Diam: 2.50 cm MR PISA:         4.02 cm MR PISA Eff ROA: 32 mm MR PISA Radius:  0.80 cm Rozann Lesches MD Electronically signed by Rozann Lesches MD Signature Date/Time: 11/04/2019/2:42:20 PM    Final     Cardiac Studies   Cath 9/18 - ostial LAD 100% - Ramus 99% -> DES - RCA mild irregularities with 90% lesion in PL branch  -EF 35-40%. Recent echo 03/2019 with normal EF 55-60% G1DD. Off statins due to side effects.   AB/pelvic CT 4/21 1. Colonic diverticulosis without evidence of diverticulitis. 2. Evidence of prior cholecystectomy. 3. Atrophic left kidney with small bilateral renal cysts. 4. Moderate to marked severity enlargement of the prostate gland. 5. 9 mm urinary bladder calculus. 6. Left adrenal mass which may represent adrenal adenoma.  Patient Profile     79 y.o. male with severe mitral regurgitation acute systolic heart failure, recent fevers up to 104 degrees Covid negative night sweats lost 8 pounds.  [facility] work-up unremarkable.  Troponin 29.  New left bundle branch block.  Assessment & Plan    Acute systolic heart failure -Prior EF 60%, most recent EF 20% with moderate to severe MR. -Preparing for cath.  Right and left. -Volume status seemed euvolemic, Dr. Haroldine Laws yesterday.  Coronary artery disease -Prior  occluded LAD.  Stent to ramus.  Cath in 2018.  Moderate to severe mitral regurgitation -TEE TODAY.  Myxomatous mitral valve disease.  Chronic kidney disease stage IIIb -Prior CT demonstrated atrophic left kidney.  Creatinine 1.33 today.  Previously 1.62.  Frequent PVCs -Carvedilol 3.125 twice daily was started on 11/05/2019.  We will go ahead and increase to 6.25.  Blood pressure will tolerate.  Mexiletine was considered.  PVC frequency seems to have improved on telemetry.  Fevers anemia, chills -TEE pending.  Make sure that there is no signs of endocarditis. -ESR was 55.  No fevers today. -Consider infectious disease input, awaiting TEE. -While in hospital here has not had any fevers but WBC 9.3K -> 15K.  ESR 55. LDH 139. Creatinine 1.4 -> 1.6 with diuresis. Bcx x 2 11/05/19 NGTD  Had dental visit in the spring and took abx beforehand. No rash or abnormal arthalgias.   Recent w/u at [facility]: ANA, ANCA, metanephrines, 5-HIAA, anti-DNA, anti mitochondrial Abs, Sjogrens, RA, SPEP - all negative HIV negative here -Discussed with Dr. Tyrell Antonio as well.  Prior GI bleed April 2021 gastric ulcer  For questions or updates, please contact [facility] Please consult [email or web address] for contact info under        Signed, Candee Furbish, MD  11/06/2019, 8:09 AM

## 2019-11-06 NOTE — Progress Notes (Addendum)
Progress Note  Patient Name: Christopher Nelson Date of Encounter: 11/06/2019  [facility] Cardiologist: Rozann Lesches, MD   Subjective   Woke up today 3 AM, hard to get back to sleep.  Sometimes feels as though he needs to take a deep breath.  No chest pain.  No fever since being here.  Inpatient Medications    Scheduled Meds: . vitamin C  1,000 mg Oral Daily  . aspirin EC  81 mg Oral Daily  . atorvastatin  40 mg Oral Daily  . carvedilol  3.125 mg Oral BID WC  . ferrous sulfate  325 mg Oral Q breakfast  . heparin  5,000 Units Subcutaneous Q8H  . insulin aspart  0-9 Units Subcutaneous TID WC  . pantoprazole  40 mg Oral BID  . polyethylene glycol  17 g Oral BID  . senna-docusate  1 tablet Oral BID  . sodium chloride flush  3 mL Intravenous Q12H  . zolpidem  5 mg Oral QHS   Continuous Infusions: . sodium chloride    . sodium chloride     PRN Meds: sodium chloride, acetaminophen, ALPRAZolam, magnesium hydroxide, ondansetron (ZOFRAN) IV, sodium chloride flush   Vital Signs    Vitals:   11/05/19 0838 11/05/19 1518 11/05/19 2101 11/06/19 0421  BP: 120/61 (!) 156/91 (!) 153/83 (!) 152/95  Pulse:  94 (!) 107 98  Resp: '17 18 16 16  ' Temp: 98.1 F (36.7 C) 98.3 F (36.8 C) 97.7 F (36.5 C) 98.3 F (36.8 C)  TempSrc: Oral Oral Oral Oral  SpO2: 98% 98% 96% 93%  Weight:    101.2 kg  Height:        Intake/Output Summary (Last 24 hours) at 11/06/2019 0809 Last data filed at 11/06/2019 0600 Gross per 24 hour  Intake 1363 ml  Output 1325 ml  Net 38 ml   Last 3 Weights 11/06/2019 11/05/2019 11/04/2019  Weight (lbs) 223 lb 3.2 oz 227 lb 8 oz 230 lb 4.8 oz  Weight (kg) 101.243 kg 103.193 kg 104.463 kg      Telemetry    Sinus 80s to 100s-PVCs noted but improved- Personally Reviewed  ECG    Sinus rhythm left bundle branch block 91 bpm, isolated PVC, QRS duration 131 ms- Personally Reviewed  Physical Exam   GEN: No acute distress.   Neck: No JVD Cardiac: RRR, 1/6 HSM,  no rubs, or gallops.  Respiratory: Clear to auscultation bilaterally. GI: Soft, nontender, non-distended  MS: No edema; No deformity. Neuro:  Nonfocal  Psych: Normal affect   Labs    High Sensitivity Troponin:   Recent Labs  Lab 11/03/19 1506  TROPONINIHS 29*      Chemistry Recent Labs  Lab 11/04/19 0317 11/04/19 0757 11/05/19 0251 11/06/19 0333  NA 137  --  136 133*  K 4.6  --  5.1 4.7  CL 102  --  100 97*  CO2 28  --  27 25  GLUCOSE 164*  --  166* 159*  BUN 22  --  28* 25*  CREATININE 1.41*  --  1.62* 1.33*  CALCIUM 8.4*  --  8.6* 8.7*  PROT  --  7.1 7.2  --   ALBUMIN  --  3.0* 3.0*  --   AST  --  15 17  --   ALT  --  18 18  --   ALKPHOS  --  54 53  --   BILITOT  --  0.6 0.9  --   GFRNONAA 47*  --  40* 51*  GFRAA 55*  --  46* 59*  ANIONGAP 7  --  9 11     Hematology Recent Labs  Lab 11/03/19 1506 11/04/19 0317 11/05/19 0251  WBC 9.7 11.6* 15.0*  RBC 3.65* 3.58* 4.02*  HGB 8.8* 8.5* 9.7*  HCT 30.1* 29.4* 33.0*  MCV 82.5 82.1 82.1  MCH 24.1* 23.7* 24.1*  MCHC 29.2* 28.9* 29.4*  RDW 15.0 14.9 15.1  PLT 287 253 328    BNP Recent Labs  Lab 11/05/19 0251  BNP 861.0*     DDimer No results for input(s): DDIMER in the last 168 hours.   Radiology    CT ABDOMEN PELVIS WO CONTRAST  Result Date: 11/05/2019 CLINICAL DATA:  Abdominal distention and fever. EXAM: CT ABDOMEN AND PELVIS WITHOUT CONTRAST TECHNIQUE: Multidetector CT imaging of the abdomen and pelvis was performed following the standard protocol without IV contrast. COMPARISON:  08/30/2019 FINDINGS: Lower chest: Small to moderate bilateral pleural effusions with associated bibasilar atelectasis which is new. Calcified plaque over the right coronary artery as well as the thoracic aorta. Hepatobiliary: Previous cholecystectomy. Liver and biliary tree are normal. Pancreas: Normal. Spleen: Normal. Adrenals/Urinary Tract: Stable subcentimeter left adrenal nodule likely an adenoma. Right adrenal gland is  normal. Stable left renal atrophy with small stone over the lower pole. Stable prominence of the left renal pelvis. Mild stable prominence of the right intrarenal collecting system. No evidence of right renal stones. Stable exophytic right renal cyst. Right ureter is normal. There is a 9 mm stone over the bladder which has moved from the midline to the region of the right UVJ. Stomach/Bowel: Stomach and small bowel are normal. Appendix is normal. Moderate diverticulosis of the colon most prominent over the descending and sigmoid colon. No active inflammation. Vascular/Lymphatic: Moderate calcified plaque of abdominal aorta which is normal in caliber. No adenopathy. Reproductive: Stable prominence of the prostate gland and seminal vesicles. Other: No significant free fluid.  No free peritoneal air. Musculoskeletal: Degenerative change of the spine and left hip. Right hip prosthesis intact. IMPRESSION: 1.  No acute findings in the abdomen/pelvis. 2. New small to moderate bilateral pleural effusions with associated bibasilar atelectasis. 3. Chronic left renal atrophy with nonobstructing left renal stone. Mild stable prominence of the right intrarenal collecting system and stable right renal cyst. 9 mm bladder stone has moved from the midline to the region of the right UVJ. 4.  Moderate colonic diverticulosis without active inflammation. 5. Aortic Atherosclerosis (ICD10-I70.0). Atherosclerotic coronary artery disease. 6.  Stable prominence of the prostate gland and seminal vesicles. Electronically Signed   By: Marin Olp M.D.   On: 11/05/2019 17:14   DG Chest 1 View  Result Date: 11/05/2019 CLINICAL DATA:  Shortness of breath EXAM: CHEST  1 VIEW COMPARISON:  11/02/2019 FINDINGS: Cardiac shadow is stable. Vascular congestion is again identified similar to that seen on the prior exam. Some slight increase in the degree of interstitial edema is seen. Small effusions are noted. No bony abnormality is seen. IMPRESSION:  Slight increase in vascular congestion with increase in interstitial edema. Electronically Signed   By: Inez Catalina M.D.   On: 11/05/2019 03:45   DG Abd 1 View  Result Date: 11/05/2019 CLINICAL DATA:  Shortness of breath and abdominal pain EXAM: ABDOMEN - 1 VIEW COMPARISON:  11/02/2019 FINDINGS: Scattered large and small bowel gas is noted. No abnormal mass or abnormal calcifications are seen. Postsurgical changes are noted. No free air is seen. IMPRESSION: No acute abnormality noted. Electronically  Signed   By: Inez Catalina M.D.   On: 11/05/2019 03:45   US Abdomen Limited  Result Date: 11/04/2019 CLINICAL DATA:  Assess right upper quadrant and abdomen for ascites. History of prior cholecystectomy. EXAM: ULTRASOUND ABDOMEN LIMITED RIGHT UPPER QUADRANT COMPARISON:  None. FINDINGS: Gallbladder: The gallbladder is surgically absent. Common bile duct: Diameter: 2.3 mm Liver: No focal lesion identified. Within normal limits in parenchymal echogenicity. Portal vein is patent on color Doppler imaging with normal direction of blood flow towards the liver. Other: No intra-abdominal free fluid is seen. A right pleural effusion is noted. IMPRESSION: 1. Findings consistent with prior cholecystectomy. 2. Right pleural effusion. 3. No evidence of ascites. Electronically Signed   By: Virgina Norfolk M.D.   On: 11/04/2019 15:22   ECHOCARDIOGRAM COMPLETE  Result Date: 11/04/2019    ECHOCARDIOGRAM REPORT   Patient Name:   MECCA BARGA Date of Exam: 11/04/2019 Medical Rec #:  [id number]        Height:       74.0 in Accession #:    [id number]       Weight:       230.3 lb Date of Birth:  07/25/1940       BSA:          2.308 m Patient Age:    33 years         BP:           138/95 mmHg Patient Gender: M                HR:           89 bpm. Exam Location:  Inpatient Procedure: 2D Echo, Cardiac Doppler and Color Doppler Indications:    Chest pain  History:        Patient has prior history of Echocardiogram examinations, most                  recent 03/08/2019. CHF, CAD, Mitral Valve Disease; Risk                 Factors:Dyslipidemia. CKD.  Sonographer:    Clayton Lefort RDCS (AE) Referring Phys: [place] Comments: Suboptimal subcostal window. IMPRESSIONS  1. Left ventricular ejection fraction, by estimation, is 25 to 30%. The left ventricle has severely decreased function. The left ventricle demonstrates regional wall motion abnormalities (see scoring diagram/findings for description). There is mild left  ventricular hypertrophy. Left ventricular diastolic parameters are indeterminate.  2. Right ventricular systolic function reduced. The right ventricular size is normal. Tricuspid regurgitation signal is inadequate for assessing PA pressure.  3. Left atrial size was severely dilated.  4. The mitral valve is abnormal, restricted posterior leaflet motion, mild annular calcification. Moderate to severe mitral valve regurgitation, eccentric and two jets noted.  5. The aortic valve is tricuspid. Aortic valve regurgitation is trivial. Mild to moderate aortic valve sclerosis/calcification is present, without any evidence of aortic stenosis.  6. Unable to estimate CVP. FINDINGS  Left Ventricle: Left ventricular ejection fraction, by estimation, is 25 to 30%. The left ventricle has severely decreased function. The left ventricle demonstrates regional wall motion abnormalities. The left ventricular internal cavity size was normal  in size. There is mild left ventricular hypertrophy. Left ventricular diastolic parameters are indeterminate.  LV Wall Scoring: The mid and distal anterior septum, mid inferolateral segment, mid inferoseptal segment, apical inferior segment, and apex are akinetic. The entire anterior wall, inferior wall, basal inferolateral segment, apical lateral segment,  and mid anterolateral segment are hypokinetic. The basal anteroseptal segment, basal anterolateral segment, and basal inferoseptal segment are normal.  Right Ventricle: The right ventricular size is normal. No increase in right ventricular wall thickness. Right ventricular systolic function reduced. Tricuspid regurgitation signal is inadequate for assessing PA pressure. Left Atrium: Left atrial size was severely dilated. Right Atrium: Right atrial size was normal in size. Pericardium: There is no evidence of pericardial effusion. Mitral Valve: The mitral valve is abnormal. Mild mitral annular calcification. Moderate to severe mitral valve regurgitation. Tricuspid Valve: The tricuspid valve is grossly normal. Tricuspid valve regurgitation is trivial. Aortic Valve: The aortic valve is tricuspid. Aortic valve regurgitation is trivial. Mild to moderate aortic valve sclerosis/calcification is present, without any evidence of aortic stenosis. Mild aortic valve annular calcification. Aortic valve mean gradient measures 2.7 mmHg. Aortic valve peak gradient measures 4.3 mmHg. Aortic valve area, by VTI measures 3.57 cm. Pulmonic Valve: The pulmonic valve was grossly normal. Pulmonic valve regurgitation is trivial. Aorta: The aortic root is normal in size and structure. Venous: Unable to assess CVP. The inferior vena cava was not well visualized. IAS/Shunts: No atrial level shunt detected by color flow Doppler.  LEFT VENTRICLE PLAX 2D LVIDd:         5.10 cm LVIDs:         4.40 cm LV PW:         2.60 cm LV IVS:        1.70 cm LVOT diam:     2.50 cm LV SV:         62 LV SV Index:   27 LVOT Area:     4.91 cm  LV Volumes (MOD) LV vol d, MOD A2C: 267.0 ml LV vol d, MOD A4C: 190.0 ml LV vol s, MOD A2C: 169.0 ml LV vol s, MOD A4C: 119.0 ml LV SV MOD A2C:     98.0 ml LV SV MOD A4C:     190.0 ml LV SV MOD BP:      82.5 ml RIGHT VENTRICLE RV Basal diam:  3.80 cm RV Mid diam:    3.70 cm RV S prime:     13.90 cm/s TAPSE (M-mode): 2.6 cm LEFT ATRIUM              Index       RIGHT ATRIUM           Index LA diam:        5.10 cm  2.21 cm/m  RA Area:     20.20 cm LA Vol (A2C):   130.0 ml  56.33 ml/m RA Volume:   71.90 ml  31.16 ml/m LA Vol (A4C):   114.0 ml 49.40 ml/m LA Biplane Vol: 123.0 ml 53.30 ml/m  AORTIC VALVE AV Area (Vmax):    3.23 cm AV Area (Vmean):   3.05 cm AV Area (VTI):     3.57 cm AV Vmax:           103.73 cm/s AV Vmean:          72.067 cm/s AV VTI:            0.173 m AV Peak Grad:      4.3 mmHg AV Mean Grad:      2.7 mmHg LVOT Vmax:         68.30 cm/s LVOT Vmean:        44.733 cm/s LVOT VTI:          0.126 m LVOT/AV VTI ratio: 0.73  AORTA Ao Asc diam: 3.60 cm MR Peak grad:    101.2 mmHg MR Mean grad:    57.0 mmHg   SHUNTS MR Vmax:         503.00 cm/s Systemic VTI:  0.13 m MR Vmean:        345.0 cm/s  Systemic Diam: 2.50 cm MR PISA:         4.02 cm MR PISA Eff ROA: 32 mm MR PISA Radius:  0.80 cm Rozann Lesches MD Electronically signed by Rozann Lesches MD Signature Date/Time: 11/04/2019/2:42:20 PM    Final     Cardiac Studies   Cath 9/18 - ostial LAD 100% - Ramus 99% -> DES - RCA mild irregularities with 90% lesion in PL branch  -EF 35-40%. Recent echo 03/2019 with normal EF 55-60% G1DD. Off statins due to side effects.   AB/pelvic CT 4/21 1. Colonic diverticulosis without evidence of diverticulitis. 2. Evidence of prior cholecystectomy. 3. Atrophic left kidney with small bilateral renal cysts. 4. Moderate to marked severity enlargement of the prostate gland. 5. 9 mm urinary bladder calculus. 6. Left adrenal mass which may represent adrenal adenoma.  Patient Profile     79 y.o. male with severe mitral regurgitation acute systolic heart failure, recent fevers up to 104 degrees Covid negative night sweats lost 8 pounds.  [facility] work-up unremarkable.  Troponin 29.  New left bundle branch block.  Assessment & Plan    Acute systolic heart failure -Prior EF 60%, most recent EF 20% with moderate to severe MR. -Preparing for cath.  Right and left. -Volume status seemed euvolemic, Dr. Haroldine Laws yesterday.  Coronary artery disease -Prior  occluded LAD.  Stent to ramus.  Cath in 2018.  Moderate to severe mitral regurgitation -TEE TODAY.  Myxomatous mitral valve disease.  Chronic kidney disease stage IIIb -Prior CT demonstrated atrophic left kidney.  Creatinine 1.33 today.  Previously 1.62.  Frequent PVCs -Carvedilol 3.125 twice daily was started on 11/05/2019.  We will go ahead and increase to 6.25.  Blood pressure will tolerate.  Mexiletine was considered.  PVC frequency seems to have improved on telemetry.  Fevers anemia, chills -TEE pending.  Make sure that there is no signs of endocarditis. -ESR was 55.  No fevers today. -Consider infectious disease input, awaiting TEE. -While in hospital here has not had any fevers but WBC 9.3K -> 15K.  ESR 55. LDH 139. Creatinine 1.4 -> 1.6 with diuresis. Bcx x 2 11/05/19 NGTD  Had dental visit in the spring and took abx beforehand. No rash or abnormal arthalgias.   Recent w/u at [facility]: ANA, ANCA, metanephrines, 5-HIAA, anti-DNA, anti mitochondrial Abs, Sjogrens, RA, SPEP - all negative HIV negative here -Discussed with Dr. Tyrell Antonio as well.  Prior GI bleed April 2021 gastric ulcer  For questions or updates, please contact [facility] Please consult [email or web address] for contact info under        Signed, Candee Furbish, MD  11/06/2019, 8:09 AM

## 2019-11-06 NOTE — CV Procedure (Signed)
    TRANSESOPHAGEAL ECHOCARDIOGRAM   NAME:  Christopher Nelson   MRN: [id number] DOB:  1940-11-28   ADMIT DATE: 11/03/2019  INDICATIONS:  Mitral regurgitation  PROCEDURE:   Informed consent was obtained prior to the procedure. The risks, benefits and alternatives for the procedure were discussed and the patient comprehended these risks.  Risks include, but are not limited to, cough, sore throat, vomiting, nausea, somnolence, esophageal and stomach trauma or perforation, bleeding, low blood pressure, aspiration, pneumonia, infection, trauma to the teeth and death.    After a procedural time-out, the patient was sedated by the anesthesia service with IV diprivan. The oropharynx was anesthetized with aerosolized cetacaine spray The transesophageal probe was inserted in the esophagus and stomach without difficulty and multiple views were obtained.    COMPLICATIONS:    There were no immediate complications.  FINDINGS:  LEFT VENTRICLE: EF = 20-25%. Global HK  RIGHT VENTRICLE: Normal size and function.   LEFT ATRIUM: Mildly dilated. LA diameter 4.3 cm  LEFT ATRIAL APPENDAGE: No thrombus.   RIGHT ATRIUM: Normal  AORTIC VALVE:  Trileaflet. Mildly calcified. Triv AI. No vegetation  MITRAL VALVE:    Normal. Myxomatous. Posterior leaflet is thickened and has mild prolapse. Severe central MR. No vegetation  TRICUSPID VALVE: Normal. Triv TR. No vegetation  PULMONIC VALVE: Normal. Mild PI. No vegetation   INTERATRIAL SEPTUM: Lipotamous hypertrophy. No PFO or ASD.  PERICARDIUM: No effusion  DESCENDING AORTA: Moderate plaque   CONCLUSION:  No TEE evidence of endocarditis.  Maja Mccaffery,MD 3:14 PM

## 2019-11-06 NOTE — Discharge Instructions (Addendum)
Medication Changes: STOP Amlodipine. STOP Losartan-HCTZ. STOP Metoprolol tartrate (Lopressor). START Carvedilol (Coreg) 12.5mg  twice daily. START Spironolactone 12.5mg  daily. START Ferous sulfate 325mg  daily (iron supplement). START Metformin 500mg  twice daily on Saturday (48 hours after cardiac catheterization).  RESTART Aspirin 81mg  daily. RESTART Atorvastatin (Lipitor) 40mg  daily.  Please continue all other medications as directed on discharge paperwork.  Heart Failure Education: 1. Weigh yourself EVERY morning after you go to the bathroom but before you eat or drink anything. Write this number down in a weight log/diary. If you gain 3 pounds overnight or 5 pounds in a week, call the office. 2. Take your medicines as prescribed. If you have concerns about your medications, please call us before you stop taking them.  3. Eat low salt foods--Limit salt (sodium) to 2000 mg per day. This will help prevent your body from holding onto fluid. Read food labels as many processed foods have a lot of sodium, especially canned goods and prepackaged meats. If you would like some assistance choosing low sodium foods, we would be happy to set you up with a nutritionist. 4. Stay as active as you can everyday. Staying active will give you more energy and make your muscles stronger. Start with 5 minutes at a time and work your way up to 30 minutes a day. Break up your activities--do some in the morning and some in the afternoon. Start with 3 days per week and work your way up to 5 days as you can.  If you have chest pain, feel short of breath, dizzy, or lightheaded, STOP. If you don't feel better after a short rest, call 911. If you do feel better, call the office to let us know you have symptoms with exercise. 5. Limit all fluids for the day to less than 2 liters. Fluid includes all drinks, coffee, juice, ice chips, soup, jello, and all other liquids.  Post Cardiac Catheterization: NO HEAVY LIFTING OR SEXUAL  ACTIVITY X 7 DAYS. NO DRIVING X 2-3 DAYS. NO SOAKING BATHS, HOT TUBS, POOLS, ETC., X 7 DAYS.  Radial Site Care: Refer to this sheet in the next few weeks. These instructions provide you with information on caring for yourself after your procedure. Your caregiver may also give you more specific instructions. Your treatment has been planned according to current medical practices, but problems sometimes occur. Call your caregiver if you have any problems or questions after your procedure. HOME CARE INSTRUCTIONS  You may shower the day after the procedure.Remove the bandage (dressing) and gently wash the site with plain soap and water.Gently pat the site dry.   Do not apply powder or lotion to the site.   Do not submerge the affected site in water for 3 to 5 days.   Inspect the site at least twice daily.   Do not flex or bend the affected arm for 24 hours.   No lifting over 5 pounds (2.3 kg) for 5 days after your procedure.   Do not drive home if you are discharged the same day of the procedure. Have someone else drive you.  What to expect:  Any bruising will usually fade within 1 to 2 weeks.   Blood that collects in the tissue (hematoma) may be painful to the touch. It should usually decrease in size and tenderness within 1 to 2 weeks.  SEEK IMMEDIATE MEDICAL CARE IF:  You have unusual pain at the radial site.   You have redness, warmth, swelling, or pain at the radial site.  You have drainage (other than a small amount of blood on the dressing).   You have chills.   You have a fever or persistent symptoms for more than 72 hours.   You have a fever and your symptoms suddenly get worse.   Your arm becomes pale, cool, tingly, or numb.   You have heavy bleeding from the site. Hold pressure on the site.   Heart Healthy, Consistent Carbohydrate Nutrition Therapy   A heart-healthy and consistent carbohydrate diet is recommended to manage heart disease and diabetes. To follow  a heart-healthy and consistent carbohydrate diet,  Eat a balanced diet with whole grains, fruits and vegetables, and lean protein sources.   Choose heart-healthy unsaturated fats. Limit saturated fats, trans fats, and cholesterol intake. Eat more plant-based or vegetarian meals using beans and soy foods for protein.   Eat whole, unprocessed foods to limit the amount of sodium (salt) you eat.   Choose a consistent amount of carbohydrate at each meal and snack. Limit refined carbohydrates especially sugar, sweets and sugar-sweetened beverages.   If you drink alcohol, do so in moderation: one serving per day (women) and two servings per day (men). o One serving is equivalent to 12 ounces beer, 5 ounces wine, or 1.5 ounces distilled spirits  Tips Tips for Choosing Heart-Healthy Fats Choose lean protein and low-fat dairy foods to reduce saturated fat intake.  Saturated fat is usually found in animal-based protein and is associated with certain health risks. Saturated fat is the biggest contributor to raise low-density lipoprotein (LDL) cholesterol levels. Research shows that limiting saturated fat lowers unhealthy cholesterol levels. Eat no more than 7% of your total calories each day from saturated fat. Ask your RDN to help you determine how much saturated fat is right for you.  There are many foods that do not contain large amounts of saturated fats. Swapping these foods to replace foods high in saturated fats will help you limit the saturated fat you eat and improve your cholesterol levels. You can also try eating more plant-based or vegetarian meals. Instead of Try:  Whole milk, cheese, yogurt, and ice cream 1% or skim milk, low-fat cheese, non-fat yogurt, and low-fat ice cream  Fatty, marbled beef and pork Lean beef, pork, or venison  Poultry with skin Poultry without skin  Butter, stick margarine Reduced-fat, whipped, or liquid spreads  Coconut oil, palm oil Liquid vegetable oils: corn,  canola, olive, soybean and safflower oils   Avoid foods that contain trans fats.  Trans fats increase levels of LDL-cholesterol. Hydrogenated fat in processed foods is the main source of trans fats in foods.   Trans fats can be found in stick margarine, shortening, processed sweets, baked goods, some fried foods, and packaged foods made with hydrogenated oils. Avoid foods with partially hydrogenated oil on the ingredient list such as: cookies, pastries, baked goods, biscuits, crackers, microwave popcorn, and frozen dinners. Choose foods with heart healthy fats.  Polyunsaturated and monounsaturated fat are unsaturated fats that may help lower your blood cholesterol level when used in place of saturated fat in your diet.  Ask your RDN about taking a dietary supplement with plant sterols and stanols to help lower your cholesterol level.  Research shows that substituting saturated fats with unsaturated fats is beneficial to cholesterol levels. Try these easy swaps: Instead of Try:  Butter, stick margarine, or solid shortening Reduced-fat, whipped, or liquid spreads  Beef, pork, or poultry with skin Fish and seafood  Chips, crackers, snack foods Raw or unsalted  nuts and seeds or nut butters Hummus with vegetables Avocado on toast  Coconut oil, palm oil Liquid vegetable oils: corn, canola, olive, soybean and safflower oils  Limit the amount of cholesterol you eat to less than 200 milligrams per day.  Cholesterol is a substance carried through the bloodstream via lipoproteins, which are known as transporters of fat. Some body functions need cholesterol to work properly, but too much cholesterol in the bloodstream can damage arteries and build up blood vessel linings (which can lead to heart attack and stroke). You should eat less than 200 milligrams cholesterol per day.  People respond differently to eating cholesterol. There is no test available right now that can figure out which people will  respond more to dietary cholesterol and which will respond less. For individuals with high intake of dietary cholesterol, different types of increase (none, small, moderate, large) in LDL-cholesterol levels are all possible.   Food sources of cholesterol include egg yolks and organ meats such as liver, gizzards. Limit egg yolks to two to four per week and avoid organ meats like liver and gizzards to control cholesterol intake. Tips for Choosing Heart-Healthy Carbohydrates Consume a consistent amount of carbohydrate  It is important to eat foods with carbohydrates in moderation because they impact your blood glucose level. Carbohydrates can be found in many foods such as:  Grains (breads, crackers, rice, pasta, and cereals)   Starchy Vegetables (potatoes, corn, and peas)   Beans and legumes   Milk, soy milk, and yogurt   Fruit and fruit juice   Sweets (cakes, cookies, ice cream, jam and jelly)  Your RDN will help you set a goal for how many carbohydrate servings to eat at your meals and snacks. For many adults, eating 3 to 5 servings of carbohydrate foods at each meal and 1 or 2 carbohydrate servings for each snack works well.   Check your blood glucose level regularly. It can tell you if you need to adjust when you eat carbohydrates.  Choose foods rich in viscous (soluble) fiber  Viscous, or soluble, is found in the walls of plant cells. Viscous fiber is found only in plant-based foods. Eating foods with fiber helps to lower your unhealthy cholesterol and keep your blood glucose in range   Rich sources of viscous fiber include vegetables (asparagus, Brussels sprouts, sweet potatoes, turnips) fruit (apricots, mangoes, oranges), legumes, and whole grains (barley, oats, and oat bran).   As you increase your fiber intake gradually, also increase the amount of water you drink. This will help prevent constipation.   If you have difficulty achieving this goal, ask your RDN about fiber  laxatives. Choose fiber supplements made with viscous fibers such as psyllium seed husks or methylcellulose to help lower unhealthy cholesterol.   Limit refined carbohydrates   There are three types of carbohydrates: starches, sugar, and fiber. Some carbohydrates occur naturally in food, like the starches in rice or corn or the sugars in fruits and milk. Refined carbohydrates--foods with high amounts of simple sugars--can raise triglyceride levels. High triglyceride levels are associated with coronary heart disease.  Some examples of refined carbohydrate foods are table sugar, sweets, and beverages sweetened with added sugar. Tips for Reducing Sodium (Salt) Although sodium is important for your body to function, too much sodium can be harmful for people with high blood pressure. As sodium and fluid buildup in your tissues and bloodstream, your blood pressure increases. High blood pressure may cause damage to other organs and increase your risk for  a stroke. Even if you take a pill for blood pressure or a water pill (diuretic) to remove fluid, it is still important to have less salt in your diet. Ask your doctor and RDN what amount of sodium is right for you.  Avoid processed foods. Eat more fresh foods.   Fresh fruits and vegetables are naturally low in sodium, as well as frozen vegetables and fruits that have no added juices or sauces.   Fresh meats are lower in sodium than processed meats, such as bacon, sausage, and hotdogs. Read the nutrition label or ask your butcher to help you find a fresh meat that is low in sodium.  Eat less salt--at the table and when cooking.   A single teaspoon of table salt has 2,300 mg of sodium.   Leave the salt out of recipes for pasta, casseroles, and soups.   Ask your RDN how to cook your favorite recipes without sodium  Be a smart shopper.   Look for food packages that say salt-free or sodium-free. These items contain less than 5 milligrams of sodium  per serving.   Very low-sodium products contain less than 35 milligrams of sodium per serving.   Low-sodium products contain less than 140 milligrams of sodium per serving.   Beware for Unsalted or No Added Salt products. These items may still be high in sodium. Check the nutrition label.  Add flavors to your food without adding sodium.   Try lemon juice, lime juice, fruit juice or vinegar.   Dry or fresh herbs add flavor. Try basil, bay leaf, dill, rosemary, parsley, sage, dry mustard, nutmeg, thyme, and paprika.   Pepper, red pepper flakes, and cayenne pepper can add spice t your meals without adding sodium. Hot sauce contains sodium, but if you use just a drop or two, it will not add up to much.   Buy a sodium-free seasoning blend or make your own at home. Additional Lifestyle Tips Achieve and maintain a healthy weight.  Talk with your RDN or your doctor about what is a healthy weight for you.  Set goals to reach and maintain that weight.   To lose weight, reduce your calorie intake along with increasing your physical activity. A weight loss of 10 to 15 pounds could reduce LDL-cholesterol by 5 milligrams per deciliter. Participate in physical activity.  Talk with your health care team to find out what types of physical activity are best for you. Set a plan to get about 30 minutes of exercise on most days.  Foods Recommended Food Group Foods Recommended  Grains Whole grain breads and cereals, including whole wheat, barley, rye, buckwheat, corn, teff, quinoa, millet, amaranth, brown or wild rice, sorghum, and oats Pasta, especially whole wheat or other whole grain types  [employer], quinoa or wild rice Whole grain crackers, bread, rolls, pitas Home-made bread with reduced-sodium baking soda  Protein Foods Lean cuts of beef and pork (loin, leg, round, extra lean hamburger)  Skinless Cytogeneticist and other wild game Dried beans and peas Nuts and nut  butters Meat alternatives made with soy or textured vegetable protein  Egg whites or egg substitute Cold cuts made with lean meat or soy protein  Dairy Nonfat (skim), low-fat, or 1%-fat milk  Nonfat or low-fat yogurt or cottage cheese Fat-free and low-fat cheese  Vegetables Fresh, frozen, or canned vegetables without added fat or salt   Fruits Fresh, frozen, canned, or dried fruit   Oils Unsaturated oils (corn, olive, peanut, soy, sunflower,  canola)  Soft or liquid margarines and vegetable oil spreads  Salad dressings Seeds and nuts  Avocado   Foods Not Recommended Food Group Foods Not Recommended  Grains Breads or crackers topped with salt Cereals (hot or cold) with more than 300 mg sodium per serving Biscuits, cornbread, and other quick breads prepared with baking soda Bread crumbs or stuffing mix from a store High-fat bakery products, such as doughnuts, biscuits, croissants, danish pastries, pies, cookies Instant cooking foods to which you add hot water and stir--potatoes, noodles, rice, etc. Packaged starchy foods--seasoned noodle or rice dishes, stuffing mix, macaroni and cheese dinner Snacks made with partially hydrogenated oils, including chips, cheese puffs, snack mixes, regular crackers, butter-flavored popcorn  Protein Foods Higher-fat cuts of meats (ribs, t-bone steak, regular hamburger) Bacon, sausage, or hot dogs Cold cuts, such as salami or bologna, deli meats, cured meats, corned beef Organ meats (liver, brains, gizzards, sweetbreads) Poultry with skin Fried or smoked meat, poultry, and fish Whole eggs and egg yolks (more than 2-4 per week) Salted legumes, nuts, seeds, or nut/seed butters Meat alternatives with high levels of sodium (>300 mg per serving) or saturated fat (>5 g per serving)  Dairy Whole milk,?2% fat milk, buttermilk Whole milk yogurt or ice cream Cream Half-&-half Cream cheese Sour cream Cheese  Vegetables Canned or frozen vegetables with  salt, fresh vegetables prepared with salt, butter, cheese, or cream sauce Fried vegetables Pickled vegetables such as olives, pickles, or sauerkraut  Fruits Fried fruits Fruits served with butter or cream  Oils Butter, stick margarine, shortening Partially hydrogenated oils or trans fats Tropical oils (coconut, palm, palm kernel oils)  Other Candy, sugar sweetened soft drinks and desserts Salt, sea salt, garlic salt, and seasoning mixes containing salt Bouillon cubes Ketchup, barbecue sauce, Worcestershire sauce, soy sauce, teriyaki sauce Miso Salsa Pickles, olives, relish   Heart Healthy Consistent Carbohydrate Vegetarian (Lacto-Ovo) Sample 1-Day Menu  Breakfast 1 cup oatmeal, cooked (2 carbohydrate servings)   cup blueberries (1 carbohydrate serving)  11 almonds, without salt  1 cup 1% milk (1 carbohydrate serving)  1 cup coffee  Morning Snack 1 cup fat-free plain yogurt (1 carbohydrate serving)  Lunch 1 whole wheat bun (1 carbohydrate servings)  1 black bean burger (1 carbohydrate servings)  1 slice cheddar cheese, low sodium  2 slices tomatoes  2 leaves lettuce  1 teaspoon mustard  1 small pear (1 carbohydrate servings)  1 cup green tea, unsweetened  Afternoon Snack 1/3 cup trail mix with nuts, seeds, and raisins, without salt (1 carbohydrate servinga)  Evening Meal  cup meatless chicken  2/3 cup brown rice, cooked (2 carbohydrate servings)  1 cup broccoli, cooked (2/3 carbohydrate serving)   cup carrots, cooked (1/3 carbohydrate serving)  2 teaspoons olive oil  1 teaspoon balsamic vinegar  1 whole wheat dinner roll (1 carbohydrate serving)  1 teaspoon margarine, soft, tub  1 cup 1% milk (1 carbohydrate serving)  Evening Snack 1 extra small banana (1 carbohydrate serving)  1 tablespoon peanut butter   Heart Healthy Consistent Carbohydrate Vegan Sample 1-Day Menu  Breakfast 1 cup oatmeal, cooked (2 carbohydrate servings)   cup blueberries (1 carbohydrate  serving)  11 almonds, without salt  1 cup soymilk fortified with calcium, vitamin B12, and vitamin D  1 cup coffee  Morning Snack 6 ounces soy yogurt (1 carbohydrate servings)  Lunch 1 whole wheat bun(1 carbohydrate servings)  1 black bean burger (1 carbohydrate serving)  2 slices tomatoes  2 leaves lettuce  1  teaspoon mustard  1 small pear (1 carbohydrate servings)  1 cup green tea, unsweetened  Afternoon Snack 1/3 cup trail mix with nuts, seeds, and raisins, without salt (1 carbohydrate servings)  Evening Meal  cup meatless chicken  2/3 cup brown rice, cooked (2 carbohydrate servings)  1 cup broccoli, cooked (2/3 carbohydrate serving)   cup carrots, cooked (1/3 carbohydrate serving)  2 teaspoons olive oil  1 teaspoon balsamic vinegar  1 whole wheat dinner roll (1 carbohydrate serving)  1 teaspoon margarine, soft, tub  1 cup soymilk fortified with calcium, vitamin B12, and vitamin D  Evening Snack 1 extra small banana (1 carbohydrate serving)  1 tablespoon peanut butter    Heart Healthy Consistent Carbohydrate Sample 1-Day Menu  Breakfast 1 cup cooked oatmeal (2 carbohydrate servings)  3/4 cup blueberries (1 carbohydrate serving)  1 ounce almonds  1 cup skim milk (1 carbohydrate serving)  1 cup coffee  Morning Snack 1 cup sugar-free nonfat yogurt (1 carbohydrate serving)  Lunch 2 slices whole-wheat bread (2 carbohydrate servings)  2 ounces lean [place] breast  1 ounce low-fat Swiss cheese  1 teaspoon mustard  1 slice tomato  1 lettuce leaf  1 small pear (1 carbohydrate serving)  1 cup skim milk (1 carbohydrate serving)  Afternoon Snack 1 ounce trail mix with unsalted nuts, seeds, and raisins (1 carbohydrate serving)  Evening Meal 3 ounces salmon  2/3 cup cooked brown rice (2 carbohydrate servings)  1 teaspoon soft margarine  1 cup cooked broccoli with 1/2 cup cooked carrots (1 carbohydrate serving  Carrots, cooked, boiled, drained, without salt  1 cup lettuce  1  teaspoon olive oil with vinegar for dressing  1 small whole grain roll (1 carbohydrate serving)  1 teaspoon soft margarine  1 cup unsweetened tea  Evening Snack 1 extra-small banana (1 carbohydrate serving)  Copyright 2020  [employer]. All rights reserved.

## 2019-11-06 NOTE — Anesthesia Postprocedure Evaluation (Signed)
Anesthesia Post Note  Patient: Christopher Nelson  Procedure(s) Performed: TRANSESOPHAGEAL ECHOCARDIOGRAM (TEE) (N/A )     Patient location during evaluation: Endoscopy Anesthesia Type: MAC Level of consciousness: awake and alert Pain management: pain level controlled Vital Signs Assessment: post-procedure vital signs reviewed and stable Respiratory status: spontaneous breathing, nonlabored ventilation, respiratory function stable and patient connected to nasal cannula oxygen Cardiovascular status: blood pressure returned to baseline and stable Postop Assessment: no apparent nausea or vomiting Anesthetic complications: no   No complications documented.  Last Vitals:  Vitals:   11/06/19 1520 11/06/19 1525  BP: (!) 117/57 124/65  Pulse: 84 86  Resp: 19 15  Temp:    SpO2: 94% 95%    Last Pain:  Vitals:   11/06/19 1525  TempSrc:   PainSc: 0-No pain                 Yarah Fuente L Shambria Camerer

## 2019-11-06 NOTE — Transfer of Care (Signed)
Immediate Anesthesia Transfer of Care Note  Patient: Christopher Nelson  Procedure(s) Performed: TRANSESOPHAGEAL ECHOCARDIOGRAM (TEE) (N/A )  Patient Location: Endoscopy Unit  Anesthesia Type:MAC  Level of Consciousness: awake, alert  and oriented  Airway & Oxygen Therapy: Patient Spontanous Breathing and Patient connected to nasal cannula oxygen  Post-op Assessment: Report given to RN and Post -op Vital signs reviewed and stable  Post vital signs: Reviewed and stable  Last Vitals:  Vitals Value Taken Time  BP 135/66   Temp    Pulse 86 11/06/19 1507  Resp 19 11/06/19 1507  SpO2 94 % 11/06/19 1507  Vitals shown include unvalidated device data.  Last Pain:  Vitals:   11/06/19 1328  TempSrc: Oral  PainSc: 0-No pain      Patients Stated Pain Goal: 0 (25/05/39 [id number])  Complications: No complications documented.

## 2019-11-06 NOTE — Interval H&P Note (Signed)
History and Physical Interval Note:  11/06/2019 2:39 PM  Conner W Schellinger  has presented today for surgery, with the diagnosis of MR.  The various methods of treatment have been discussed with the patient and family. After consideration of risks, benefits and other options for treatment, the patient has consented to  Procedure(s): TRANSESOPHAGEAL ECHOCARDIOGRAM (TEE) (N/A) as a surgical intervention.  The patient's history has been reviewed, patient examined, no change in status, stable for surgery.  I have reviewed the patient's chart and labs.  Questions were answered to the patient's satisfaction.     Jesusa Stenerson

## 2019-11-06 NOTE — Progress Notes (Addendum)
PROGRESS NOTE    Christopher Nelson  KAJ:[id number] DOB: Jul 23, 1940 DOA: 11/03/2019 PCP: Rory Percy, MD   Brief Narrative: 79 year old with past medical history significant for hypertension, GERD, CAD status post CABG 2020 who was transferred from [facility] for work-up of chest pain.  Patient has been following with [facility] hematology for chronic anemia, last seen on 6/24.  Over past several weeks patient has been experiencing sweats, chills and fever after dinner associated with tightness and bloating.  Work-up included: 5 HIAA, anticentromere antibody, ANCA, anti DNA, RF, ANA, cortisol, metanephrines, B12, SPEP, gastrin which were found to be unremarkable.  Initial white blood cell at [facility] was elevated at 15, hemoglobin 9.5, chest x-ray with central vascular congestion and mild interstitial opacity suggesting edema.  UA was also unremarkable.  On arrival to [facility] white blood cell was noted to be at 9.7. Afebrile.  Covid test: Negative/. Undergoing evaluation for heart failure and fever.    Assessment & Plan:   Active Problems:   Anemia   Chest pain of uncertain etiology   Acute on chronic combined systolic and diastolic CHF (congestive heart failure) (HCC)   CAD in native artery   Acute CHF (congestive heart failure) (HCC)  1-Acute systolic heart failure exacerbation: History of ischemic cardiomyopathy with prior ejection fraction 55% Patient presents with shortness of breath, chest tightness, elevated BNP, chest x-ray with pulmonary congestion. Continue with IV Lasix. Cardiology already following. Echo: Ejection fraction 25 to 30%, right ventricular function reduced as well, moderate to severe mitral valve rehabilitation -1.5 L weight: 230--230--223 -Heart failure team consulted. Plan for heart cath during this admission.   2-Abdominal distention: He had a CT abdomen and pelvis April 2021: Colonic diverticulosis without evidence of diverticulitis.  Prior cholecystectomy,  atrophic left kidney with a small bilateral renal cyst, moderate to marked severity enlargement of prostate gland.  Left adrenal mass which may represent adrenal adenoma. -Right upper quadrant ultrasound; prior cholecystectomy, right pleural effusion, no evidence of ascites, bile duct 2.3 mm -Bowel regimen. -CT abdomen pelvis due to leukocytosis. Negative for acute infectious process.  -[place] negative for ascites.   Left adrenal mass: Need follow-up. Checking  metanephrine levels, aldosterone, aldosterone renin ratio, DHEA.   Fevers: Patient reports fever for the last several weeks on and off. He has remained afebrile in the hospital. HIV negative Liver function test normal He had a chest x-ray and UA performed at [facility] counting negative for sign of infection. Now with leukocytosis, follow urine culture insignificant growth, CT abdomen pelvis negative for acute infectious process. Showed 9 mm bladder stone has moved from the midline to the region of the right UVJ.  Follow Blood culture. Agree with TEE>  Check metanephrine level. ID consulted. Dr Linus Salmons will see patient  in consultation.   Iron deficiency anemia: Patient had endoscopy 4/21 showed gastric ulcers and erosion with mild inflammation in the duodenal bulb Continue with PPI Started  iron.  Hypertension: Hold Norvasc due to low ejection fraction Continue with metoprolol,  Holding Cozaar, Lasix in anticipation of heart cath.   AKI CKD stage IIIb, creatinine baseline 1.3--1.4 Increased to 1.6.  Could be related to heart failure Monitor renal function.  Diabetes; history of pre diabetic. Diabetes educator.  SSI.   9 mm bladder stone has moved from the midline to the region of the right UVJ. Discussed with urology, patient to follow up with urology in couple of months,.   Estimated body mass index is 29.15 kg/m as calculated  from the following:   Height as of this encounter: 6\' 2"  (1.88 m).   Weight as of this  encounter: 103 kg.   DVT prophylaxis: Heparin Code Status: Full code Family Communication: Discussed with patient Disposition Plan:  Status is: Inpatient  Remains inpatient appropriate because:IV treatments appropriate due to intensity of illness or inability to take PO   Dispo: The patient is from: Home              Anticipated d/c is to: Home              Anticipated d/c date is: 2 days              Patient currently is not medically stable to d/c.  Is currently requiring IV lasix.         Consultants:   Cardiology Primary   Procedures:   US  ECHO; Left ventricular ejection fraction, by estimation, is 25 to 30%. The  left ventricle has severely decreased function. The left ventricle  demonstrates regional wall motion abnormalities (see scoring  diagram/findings for description). There is mild left  ventricular hypertrophy. Left ventricular diastolic parameters are  indeterminate.  2. Right ventricular systolic function reduced. The right ventricular  size is normal. Tricuspid regurgitation signal is inadequate for assessing  PA pressure.  3. Left atrial size was severely dilated.  4. The mitral valve is abnormal, restricted posterior leaflet motion,  mild annular calcification. Moderate to severe mitral valve regurgitation,  eccentric and two jets noted.  5. The aortic valve is tricuspid. Aortic valve regurgitation is trivial.  Mild to moderate aortic valve sclerosis/calcification is present, without  any evidence of aortic stenosis.  6. Unable to estimate CVP.   Antimicrobials:  none  Subjective: He report waking up with SOB, cant fall back to sleep.  Denies abdominal pain, back pain.    Objective: Vitals:   11/05/19 1518 11/05/19 2101 11/06/19 0421 11/06/19 1328  BP: (!) 156/91 (!) 153/83 (!) 152/95 (!) 143/84  Pulse: 94 (!) 107 98 91  Resp: 18 16 16 20   Temp: 98.3 F (36.8 C) 97.7 F (36.5 C) 98.3 F (36.8 C) 97.7 F (36.5 C)  TempSrc:  Oral Oral Oral Oral  SpO2: 98% 96% 93% 98%  Weight:   101.2 kg 103 kg  Height:    6\' 2"  (1.88 m)    Intake/Output Summary (Last 24 hours) at 11/06/2019 1359 Last data filed at 11/06/2019 0600 Gross per 24 hour  Intake 363 ml  Output 450 ml  Net -87 ml   Filed Weights   11/05/19 0531 11/06/19 0421 11/06/19 1328  Weight: 103.2 kg 101.2 kg 103 kg    Examination:  General exam: NAD Respiratory system: Respiratory effort normal, crackles  bases Cardiovascular system: S 1, S 2 RRR Gastrointestinal system: BS present, soft, nt Central nervous system: Alert, following command.  Extremities: Symmetric power Skin; no rash s   Data Reviewed: I have personally reviewed following labs and imaging studies  CBC: Recent Labs  Lab 11/03/19 1506 11/04/19 0317 11/05/19 0251 11/06/19 1032  WBC 9.7 11.6* 15.0* 10.9*  NEUTROABS  --  8.7*  --   --   HGB 8.8* 8.5* 9.7* 9.5*  HCT 30.1* 29.4* 33.0* 31.8*  MCV 82.5 82.1 82.1 80.7  PLT 287 253 328 341   Basic Metabolic Panel: Recent Labs  Lab 11/03/19 1506 11/03/19 1841 11/04/19 0317 11/05/19 0251 11/06/19 0333  NA 138  --  137 136 133*  K 3.8  --  4.6 5.1 4.7  CL 103  --  102 100 97*  CO2 28  --  28 27 25   GLUCOSE 149*  --  164* 166* 159*  BUN 23  --  22 28* 25*  CREATININE 1.44* 1.52* 1.41* 1.62* 1.33*  CALCIUM 8.5*  --  8.4* 8.6* 8.7*   GFR: Estimated Creatinine Clearance: 58.6 mL/min (A) (by C-G formula based on SCr of 1.33 mg/dL (H)). Liver Function Tests: Recent Labs  Lab 11/04/19 0757 11/05/19 0251  AST 15 17  ALT 18 18  ALKPHOS 54 53  BILITOT 0.6 0.9  PROT 7.1 7.2  ALBUMIN 3.0* 3.0*   No results for input(s): LIPASE, AMYLASE in the last 168 hours. No results for input(s): AMMONIA in the last 168 hours. Coagulation Profile: No results for input(s): INR, PROTIME in the last 168 hours. Cardiac Enzymes: No results for input(s): CKTOTAL, CKMB, CKMBINDEX, TROPONINI in the last 168 hours. BNP (last 3 results) No  results for input(s): PROBNP in the last 8760 hours. HbA1C: Recent Labs    11/04/19 0317  HGBA1C 7.9*   CBG: Recent Labs  Lab 11/05/19 1116 11/05/19 1707 11/05/19 2103 11/06/19 0758 11/06/19 1150  GLUCAP 129* 166* 159* 161* 138*   Lipid Profile: No results for input(s): CHOL, HDL, LDLCALC, TRIG, CHOLHDL, LDLDIRECT in the last 72 hours. Thyroid Function Tests: Recent Labs    11/04/19 0757  TSH 0.614   Anemia Panel: Recent Labs    11/04/19 1101  FERRITIN 37  TIBC 258  IRON 18*   Sepsis Labs: No results for input(s): PROCALCITON, LATICACIDVEN in the last 168 hours.  Recent Results (from the past 240 hour(s))  Culture, blood (routine x 2)     Status: None (Preliminary result)   Collection Time: 11/05/19  8:37 AM   Specimen: BLOOD  Result Value Ref Range Status   Specimen Description BLOOD BLOOD LEFT ARM  Final   Special Requests   Final    BOTTLES DRAWN AEROBIC ONLY Blood Culture adequate volume   Culture   Final    NO GROWTH 1 DAY Performed at [facility], [address] [place], [place] [address]    Report Status PENDING  Incomplete  Culture, blood (routine x 2)     Status: None (Preliminary result)   Collection Time: 11/05/19  8:37 AM   Specimen: BLOOD  Result Value Ref Range Status   Specimen Description BLOOD BLOOD LEFT ARM  Final   Special Requests   Final    BOTTLES DRAWN AEROBIC ONLY Blood Culture adequate volume   Culture   Final    NO GROWTH 1 DAY Performed at [facility], [place] [address], [place], [place] [address]    Report Status PENDING  Incomplete  Urine Culture     Status: Abnormal   Collection Time: 11/05/19  2:10 PM   Specimen: Urine, Random  Result Value Ref Range Status   Specimen Description URINE, RANDOM  Final   Special Requests NONE  Final   Culture (A)  Final    <10,000 COLONIES/mL INSIGNIFICANT GROWTH Performed at [facility], [place] [address], [place], [place] [address]    Report Status 11/06/2019 FINAL   Final  SARS Coronavirus 2 by RT PCR (hospital order, performed in [facility] hospital lab) Nasopharyngeal Nasopharyngeal Swab     Status: None   Collection Time: 11/05/19  6:26 PM   Specimen: Nasopharyngeal Swab  Result Value Ref Range Status   SARS Coronavirus 2  NEGATIVE NEGATIVE Final    Comment: (NOTE) SARS-CoV-2 target nucleic acids are NOT DETECTED.  The SARS-CoV-2 RNA is generally detectable in upper and lower respiratory specimens during the acute phase of infection. The lowest concentration of SARS-CoV-2 viral copies this assay can detect is 250 copies / mL. A negative result does not preclude SARS-CoV-2 infection and should not be used as the sole basis for treatment or other patient management decisions.  A negative result may occur with improper specimen collection / handling, submission of specimen other than nasopharyngeal swab, presence of viral mutation(s) within the areas targeted by this assay, and inadequate number of viral copies (<250 copies / mL). A negative result must be combined with clinical observations, patient history, and epidemiological information.  Fact Sheet for Patients:   StrictlyIdeas.no  Fact Sheet for Healthcare Providers: BankingDealers.co.za  This test is not yet approved or  cleared by the [place] FDA and has been authorized for detection and/or diagnosis of SARS-CoV-2 by FDA under an Emergency Use Authorization (EUA).  This EUA will remain in effect (meaning this test can be used) for the duration of the COVID-19 declaration under Section 564(b)(1) of the Act, 21 U.S.C. section 360bbb-3(b)(1), unless the authorization is terminated or revoked sooner.  Performed at [facility], [place] [address] [place], [place] [address]          Radiology Studies: CT ABDOMEN PELVIS WO CONTRAST  Result Date: 11/05/2019 CLINICAL DATA:  Abdominal distention and fever. EXAM: CT ABDOMEN AND  PELVIS WITHOUT CONTRAST TECHNIQUE: Multidetector CT imaging of the abdomen and pelvis was performed following the standard protocol without IV contrast. COMPARISON:  08/30/2019 FINDINGS: Lower chest: Small to moderate bilateral pleural effusions with associated bibasilar atelectasis which is new. Calcified plaque over the right coronary artery as well as the thoracic aorta. Hepatobiliary: Previous cholecystectomy. Liver and biliary tree are normal. Pancreas: Normal. Spleen: Normal. Adrenals/Urinary Tract: Stable subcentimeter left adrenal nodule likely an adenoma. Right adrenal gland is normal. Stable left renal atrophy with small stone over the lower pole. Stable prominence of the left renal pelvis. Mild stable prominence of the right intrarenal collecting system. No evidence of right renal stones. Stable exophytic right renal cyst. Right ureter is normal. There is a 9 mm stone over the bladder which has moved from the midline to the region of the right UVJ. Stomach/Bowel: Stomach and small bowel are normal. Appendix is normal. Moderate diverticulosis of the colon most prominent over the descending and sigmoid colon. No active inflammation. Vascular/Lymphatic: Moderate calcified plaque of abdominal aorta which is normal in caliber. No adenopathy. Reproductive: Stable prominence of the prostate gland and seminal vesicles. Other: No significant free fluid.  No free peritoneal air. Musculoskeletal: Degenerative change of the spine and left hip. Right hip prosthesis intact. IMPRESSION: 1.  No acute findings in the abdomen/pelvis. 2. New small to moderate bilateral pleural effusions with associated bibasilar atelectasis. 3. Chronic left renal atrophy with nonobstructing left renal stone. Mild stable prominence of the right intrarenal collecting system and stable right renal cyst. 9 mm bladder stone has moved from the midline to the region of the right UVJ. 4.  Moderate colonic diverticulosis without active inflammation.  5. Aortic Atherosclerosis (ICD10-I70.0). Atherosclerotic coronary artery disease. 6.  Stable prominence of the prostate gland and seminal vesicles. Electronically Signed   By: Marin Olp M.D.   On: 11/05/2019 17:14   DG Chest 1 View  Result Date: 11/05/2019 CLINICAL DATA:  Shortness of breath EXAM: CHEST  1 VIEW COMPARISON:  11/02/2019 FINDINGS: Cardiac shadow is stable. Vascular congestion is again identified similar to that seen on the prior exam. Some slight increase in the degree of interstitial edema is seen. Small effusions are noted. No bony abnormality is seen. IMPRESSION: Slight increase in vascular congestion with increase in interstitial edema. Electronically Signed   By: Inez Catalina M.D.   On: 11/05/2019 03:45   DG Abd 1 View  Result Date: 11/05/2019 CLINICAL DATA:  Shortness of breath and abdominal pain EXAM: ABDOMEN - 1 VIEW COMPARISON:  11/02/2019 FINDINGS: Scattered large and small bowel gas is noted. No abnormal mass or abnormal calcifications are seen. Postsurgical changes are noted. No free air is seen. IMPRESSION: No acute abnormality noted. Electronically Signed   By: Inez Catalina M.D.   On: 11/05/2019 03:45   US Abdomen Limited  Result Date: 11/04/2019 CLINICAL DATA:  Assess right upper quadrant and abdomen for ascites. History of prior cholecystectomy. EXAM: ULTRASOUND ABDOMEN LIMITED RIGHT UPPER QUADRANT COMPARISON:  None. FINDINGS: Gallbladder: The gallbladder is surgically absent. Common bile duct: Diameter: 2.3 mm Liver: No focal lesion identified. Within normal limits in parenchymal echogenicity. Portal vein is patent on color Doppler imaging with normal direction of blood flow towards the liver. Other: No intra-abdominal free fluid is seen. A right pleural effusion is noted. IMPRESSION: 1. Findings consistent with prior cholecystectomy. 2. Right pleural effusion. 3. No evidence of ascites. Electronically Signed   By: Virgina Norfolk M.D.   On: 11/04/2019 15:22         Scheduled Meds: . [MAR Hold] vitamin C  1,000 mg Oral Daily  . [MAR Hold] aspirin EC  81 mg Oral Daily  . [MAR Hold] atorvastatin  40 mg Oral Daily  . [MAR Hold] carvedilol  6.25 mg Oral BID WC  . [MAR Hold] ferrous sulfate  325 mg Oral Q breakfast  . [MAR Hold] heparin  5,000 Units Subcutaneous Q8H  . [MAR Hold] insulin aspart  0-9 Units Subcutaneous TID WC  . [MAR Hold] pantoprazole  40 mg Oral BID  . [MAR Hold] polyethylene glycol  17 g Oral BID  . [MAR Hold] senna-docusate  1 tablet Oral BID  . [MAR Hold] sodium chloride flush  3 mL Intravenous Q12H  . [MAR Hold] zolpidem  5 mg Oral QHS   Continuous Infusions: . [MAR Hold] sodium chloride    . sodium chloride    . lactated ringers 1,000 mL (11/06/19 1344)     LOS: 3 days    Time spent: 35 minutes.     Elmarie Shiley, MD [facility]   If 7PM-7AM, please contact night-coverage [email or web address]  11/06/2019, 1:59 PM

## 2019-11-06 NOTE — Consult Note (Signed)
[facility]       Reason for Consult: fever    Referring Physician: Dr. Tyrell Antonio  Active Problems:   Anemia   Chest pain of uncertain etiology   Acute on chronic combined systolic and diastolic CHF (congestive heart failure) (HCC)   CAD in native artery   Acute CHF (congestive heart failure) ([place])   . vitamin C  1,000 mg Oral Daily  . aspirin EC  81 mg Oral Daily  . atorvastatin  40 mg Oral Daily  . carvedilol  6.25 mg Oral BID WC  . ferrous sulfate  325 mg Oral Q breakfast  . heparin  5,000 Units Subcutaneous Q8H  . insulin aspart  0-9 Units Subcutaneous TID WC  . pantoprazole  40 mg Oral BID  . polyethylene glycol  17 g Oral BID  . senna-docusate  1 tablet Oral BID  . sodium chloride flush  3 mL Intravenous Q12H  . zolpidem  5 mg Oral QHS    Recommendations: Continue to observe off of antibiotics  Tee today  Assessment: He has intermittent fever, typically in the evenings up to 101 with associated chills and  Also now associated with newly discovered EF of just 25-30% with MV regurg.  Differential is broad and TEE today for ? Culture negative endocarditis.  Has an adenoma on his adrenal gland and labs sent out.  Previous work up at [facility] unrevealing.  Some pleural effusion on abdominal CT but no particular respiratory symptoms.    Antibiotics: none  HPI: Christopher Nelson is a 79 y.o. male with a history of CAD and previous cardiac interventions, recent GI bleed due to a gastric ulcer who was transferred here from [facility] due to chest pain. He has had several weeks of ongoing, intermittent fever associated with night sweats and was being evaluated by hematology in [place].  He was last seen there on 6/24 mainly due to his anemia thought to be due to blood loss.  Was taking ibuprofen for fever but stopped due to the ulcer.  Fever up to 104 one time but otherwise is typically up to 101.  Happens in the evenings.  Some intermittent leukocytosis  and up to 15,000 here.  Was 15.7 7/2.  No particular symptoms associated with the fevers. He did have some oral ulcers in his mouth that have resolved.  He does wake up feeling sob but oxygen level has remained good.    Review of Systems:  Constitutional: positive for fevers and chills, negative for weight loss, loss of appetite Ears, nose, mouth, throat, and face: negative for sore throat Respiratory: positive for cough, negative for dyspnea on exertion Gastrointestinal: positive for bloating, negative for nausea and vomiting Genitourinary: negative for frequency and dysuria Integument/breast: negative for rash Hematologic/lymphatic: negative for lymphadenopathy Musculoskeletal: negative for myalgias and arthralgias Allergic/Immunologic: negative for angioedema and eczema All other systems reviewed and are negative    Past Medical History:  Diagnosis Date  . Arthritis   . CKD (chronic kidney disease), stage III   . Coronary artery disease    a. unstable angina s/p cath 01/2017 -  specifically DES to a large branching ramus intermedius. Residual disease includes ostial occlusion of the LAD with right-to-left collaterals, also 90% posterolateral stenosis. EF 35-40% by cath but then 55-60% by echo.  . Essential hypertension   . GERD (gastroesophageal reflux disease)   . Gout   . History of kidney stones   . Ischemic cardiomyopathy  a. EF 35-40% by cath then 55-60% by echo shortly after in 01/2017.  . Mitral valve disease    a. 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  . Pre-diabetes     Social History   Tobacco Use  . Smoking status: Never Smoker  . Smokeless tobacco: Never Used  Vaping Use  . Vaping Use: Never used  Substance Use Topics  . Alcohol use: No  . Drug use: No    Family History  Problem Relation Age of Onset  . Uterine cancer Mother   . Diabetes Mellitus II Sister   . Heart attack Brother        Age 32  .  Pancreatic cancer Sister     Allergies  Allergen Reactions  . Cephalosporins Itching    Physical Exam: Constitutional: in no apparent distress  Vitals:   11/05/19 2101 11/06/19 0421  BP: (!) 153/83 (!) 152/95  Pulse: (!) 107 98  Resp: 16 16  Temp: 97.7 F (36.5 C) 98.3 F (36.8 C)  SpO2: 96% 93%   EYES: anicteric ENMT: no thrush Cardiovascular: Cor RRR Respiratory: clear; GI: + distention, normoactive bowel sounds; soft, non tender Musculoskeletal: no pedal edema noted Skin: negatives: no rash Neuro: non-focal Hematologic: no cervical, no supraclavicular lymphadenopathy  Lab Results  Component Value Date   WBC 15.0 (H) 11/05/2019   HGB 9.7 (L) 11/05/2019   HCT 33.0 (L) 11/05/2019   MCV 82.1 11/05/2019   PLT 328 11/05/2019    Lab Results  Component Value Date   CREATININE 1.33 (H) 11/06/2019   BUN 25 (H) 11/06/2019   NA 133 (L) 11/06/2019   K 4.7 11/06/2019   CL 97 (L) 11/06/2019   CO2 25 11/06/2019    Lab Results  Component Value Date   ALT 18 11/05/2019   AST 17 11/05/2019   ALKPHOS 53 11/05/2019     Microbiology: Recent Results (from the past 240 hour(s))  Culture, blood (routine x 2)     Status: None (Preliminary result)   Collection Time: 11/05/19  8:37 AM   Specimen: BLOOD  Result Value Ref Range Status   Specimen Description BLOOD BLOOD LEFT ARM  Final   Special Requests   Final    BOTTLES DRAWN AEROBIC ONLY Blood Culture adequate volume   Culture   Final    NO GROWTH < 12 HOURS Performed at [facility], [address] [place], [place] [address]    Report Status PENDING  Incomplete  Culture, blood (routine x 2)     Status: None (Preliminary result)   Collection Time: 11/05/19  8:37 AM   Specimen: BLOOD  Result Value Ref Range Status   Specimen Description BLOOD BLOOD LEFT ARM  Final   Special Requests   Final    BOTTLES DRAWN AEROBIC ONLY Blood Culture adequate volume   Culture   Final    NO GROWTH < 12 HOURS Performed at  [facility], [place] [address], [place], [place] [address]    Report Status PENDING  Incomplete  Urine Culture     Status: Abnormal   Collection Time: 11/05/19  2:10 PM   Specimen: Urine, Random  Result Value Ref Range Status   Specimen Description URINE, RANDOM  Final   Special Requests NONE  Final   Culture (A)  Final    <10,000 COLONIES/mL INSIGNIFICANT GROWTH Performed at [facility], [place] [address], [place], [place] [address]  Report Status 11/06/2019 FINAL  Final  SARS Coronavirus 2 by RT PCR (hospital order, performed in [facility] hospital lab) Nasopharyngeal Nasopharyngeal Swab     Status: None   Collection Time: 11/05/19  6:26 PM   Specimen: Nasopharyngeal Swab  Result Value Ref Range Status   SARS Coronavirus 2 NEGATIVE NEGATIVE Final    Comment: (NOTE) SARS-CoV-2 target nucleic acids are NOT DETECTED.  The SARS-CoV-2 RNA is generally detectable in upper and lower respiratory specimens during the acute phase of infection. The lowest concentration of SARS-CoV-2 viral copies this assay can detect is 250 copies / mL. A negative result does not preclude SARS-CoV-2 infection and should not be used as the sole basis for treatment or other patient management decisions.  A negative result may occur with improper specimen collection / handling, submission of specimen other than nasopharyngeal swab, presence of viral mutation(s) within the areas targeted by this assay, and inadequate number of viral copies (<250 copies / mL). A negative result must be combined with clinical observations, patient history, and epidemiological information.  Fact Sheet for Patients:   StrictlyIdeas.no  Fact Sheet for Healthcare Providers: BankingDealers.co.za  This test is not yet approved or  cleared by the [place] FDA and has been authorized for detection and/or diagnosis of SARS-CoV-2 by FDA under an Emergency Use  Authorization (EUA).  This EUA will remain in effect (meaning this test can be used) for the duration of the COVID-19 declaration under Section 564(b)(1) of the Act, 21 U.S.C. section 360bbb-3(b)(1), unless the authorization is terminated or revoked sooner.  Performed at [facility], [place] [address] [place], [place] [address]     Nusaybah Ivie W Aleshka Corney, [facility] Infectious Disease [facility] [email or web address] 11/06/2019, 10:42 AM

## 2019-11-06 NOTE — Anesthesia Procedure Notes (Signed)
Procedure Name: MAC Date/Time: 11/06/2019 2:38 PM Performed by: Candis Shine, CRNA Pre-anesthesia Checklist: Patient identified, Emergency Drugs available, Suction available, Patient being monitored and Timeout performed Patient Re-evaluated:Patient Re-evaluated prior to induction Oxygen Delivery Method: Nasal cannula Dental Injury: Teeth and Oropharynx as per pre-operative assessment

## 2019-11-07 ENCOUNTER — Encounter: Payer: Self-pay | Admitting: *Deleted

## 2019-11-07 ENCOUNTER — Encounter (HOSPITAL_COMMUNITY)
Admission: AD | Disposition: A | Discharge status: Home / Self Care | Payer: Self-pay | Source: Other Acute Inpatient Hospital | Attending: Internal Medicine

## 2019-11-07 DIAGNOSIS — I493 Ventricular premature depolarization: Secondary | ICD-10-CM

## 2019-11-07 DIAGNOSIS — R509 Fever, unspecified: Secondary | ICD-10-CM

## 2019-11-07 DIAGNOSIS — I340 Nonrheumatic mitral (valve) insufficiency: Secondary | ICD-10-CM

## 2019-11-07 DIAGNOSIS — Z006 Encounter for examination for normal comparison and control in clinical research program: Secondary | ICD-10-CM

## 2019-11-07 HISTORY — PX: RIGHT/LEFT HEART CATH AND CORONARY ANGIOGRAPHY: CATH118266

## 2019-11-07 LAB — POCT I-STAT 7, (LYTES, BLD GAS, ICA,H+H)
Acid-base deficit: 2 mmol/L (ref 0.0–2.0)
Bicarbonate: 22 mmol/L (ref 20.0–28.0)
Calcium, Ion: 1.01 mmol/L — ABNORMAL LOW (ref 1.15–1.40)
HCT: 27 % — ABNORMAL LOW (ref 39.0–52.0)
Hemoglobin: 9.2 g/dL — ABNORMAL LOW (ref 13.0–17.0)
O2 Saturation: 97 %
Potassium: 4 mmol/L (ref 3.5–5.1)
Sodium: 141 mmol/L (ref 135–145)
TCO2: 23 mmol/L (ref 22–32)
pCO2 arterial: 33.3 mmHg (ref 32.0–48.0)
pH, Arterial: 7.428 (ref 7.350–7.450)
pO2, Arterial: 88 mmHg (ref 83.0–108.0)

## 2019-11-07 LAB — POCT I-STAT EG7
Acid-Base Excess: 1 mmol/L (ref 0.0–2.0)
Acid-base deficit: 2 mmol/L (ref 0.0–2.0)
Bicarbonate: 22.4 mmol/L (ref 20.0–28.0)
Bicarbonate: 25.2 mmol/L (ref 20.0–28.0)
Calcium, Ion: 1 mmol/L — ABNORMAL LOW (ref 1.15–1.40)
Calcium, Ion: 1.16 mmol/L (ref 1.15–1.40)
HCT: 27 % — ABNORMAL LOW (ref 39.0–52.0)
HCT: 30 % — ABNORMAL LOW (ref 39.0–52.0)
Hemoglobin: 10.2 g/dL — ABNORMAL LOW (ref 13.0–17.0)
Hemoglobin: 9.2 g/dL — ABNORMAL LOW (ref 13.0–17.0)
O2 Saturation: 67 %
O2 Saturation: 71 %
Potassium: 4 mmol/L (ref 3.5–5.1)
Potassium: 4.6 mmol/L (ref 3.5–5.1)
Sodium: 137 mmol/L (ref 135–145)
Sodium: 142 mmol/L (ref 135–145)
TCO2: 23 mmol/L (ref 22–32)
TCO2: 26 mmol/L (ref 22–32)
pCO2, Ven: 34.8 mmHg — ABNORMAL LOW (ref 44.0–60.0)
pCO2, Ven: 38.7 mmHg — ABNORMAL LOW (ref 44.0–60.0)
pH, Ven: 7.416 (ref 7.250–7.430)
pH, Ven: 7.422 (ref 7.250–7.430)
pO2, Ven: 34 mmHg (ref 32.0–45.0)
pO2, Ven: 36 mmHg (ref 32.0–45.0)

## 2019-11-07 LAB — BASIC METABOLIC PANEL
Anion gap: 10 (ref 5–15)
BUN: 19 mg/dL (ref 8–23)
CO2: 23 mmol/L (ref 22–32)
Calcium: 8.8 mg/dL — ABNORMAL LOW (ref 8.9–10.3)
Chloride: 99 mmol/L (ref 98–111)
Creatinine, Ser: 1.27 mg/dL — ABNORMAL HIGH (ref 0.61–1.24)
GFR calc Af Amer: >60 mL/min (ref >60)
GFR calc non Af Amer: 54 mL/min — ABNORMAL LOW (ref >60)
Glucose, Bld: 141 mg/dL — ABNORMAL HIGH (ref 70–99)
Potassium: 4.4 mmol/L (ref 3.5–5.1)
Sodium: 132 mmol/L — ABNORMAL LOW (ref 135–145)

## 2019-11-07 LAB — GLUCOSE, CAPILLARY
Glucose-Capillary: 131 mg/dL — ABNORMAL HIGH (ref 70–99)
Glucose-Capillary: 134 mg/dL — ABNORMAL HIGH (ref 70–99)
Glucose-Capillary: 136 mg/dL — ABNORMAL HIGH (ref 70–99)
Glucose-Capillary: 226 mg/dL — ABNORMAL HIGH (ref 70–99)

## 2019-11-07 LAB — CBC
HCT: 30.8 % — ABNORMAL LOW (ref 39.0–52.0)
Hemoglobin: 9.2 g/dL — ABNORMAL LOW (ref 13.0–17.0)
MCH: 24.3 pg — ABNORMAL LOW (ref 26.0–34.0)
MCHC: 29.9 g/dL — ABNORMAL LOW (ref 30.0–36.0)
MCV: 81.3 fL (ref 80.0–100.0)
Platelets: 319 10*3/uL (ref 150–400)
RBC: 3.79 MIL/uL — ABNORMAL LOW (ref 4.22–5.81)
RDW: 15.2 % (ref 11.5–15.5)
WBC: 11.3 10*3/uL — ABNORMAL HIGH (ref 4.0–10.5)
nRBC: 0 % (ref 0.0–0.2)

## 2019-11-07 SURGERY — RIGHT/LEFT HEART CATH AND CORONARY ANGIOGRAPHY
Anesthesia: LOCAL

## 2019-11-07 MED ORDER — HEPARIN SODIUM (PORCINE) 1000 UNIT/ML IJ SOLN
INTRAMUSCULAR | Status: DC | PRN
  Administered 2019-11-07: 5000 [IU] via INTRAVENOUS

## 2019-11-07 MED ORDER — HEPARIN SODIUM (PORCINE) 5000 UNIT/ML IJ SOLN
5000.0000 [IU] | Freq: Three times a day (TID) | INTRAMUSCULAR | Status: DC
  Administered 2019-11-08: 5000 [IU] via SUBCUTANEOUS
  Filled 2019-11-07: qty 1

## 2019-11-07 MED ORDER — ASPIRIN 81 MG PO CHEW: 81.0000 mg | CHEWABLE_TABLET | ORAL | Status: AC

## 2019-11-07 MED ORDER — SODIUM CHLORIDE 0.9 % IV SOLN: INTRAVENOUS | Status: DC

## 2019-11-07 MED ORDER — ONDANSETRON HCL 4 MG/2ML IJ SOLN: 4.0000 mg | Freq: Four times a day (QID) | INTRAMUSCULAR | Status: DC | PRN

## 2019-11-07 MED ORDER — SODIUM CHLORIDE 0.9% FLUSH: 3.0000 mL | INTRAVENOUS | Status: DC | PRN

## 2019-11-07 MED ORDER — LIDOCAINE HCL (PF) 1 % IJ SOLN
INTRAMUSCULAR | Status: DC | PRN
  Administered 2019-11-07: 1 mL
  Administered 2019-11-07: 2 mL

## 2019-11-07 MED ORDER — FENTANYL CITRATE (PF) 100 MCG/2ML IJ SOLN
INTRAMUSCULAR | Status: DC | PRN
  Administered 2019-11-07 (×2): 25 ug via INTRAVENOUS

## 2019-11-07 MED ORDER — SODIUM CHLORIDE 0.9 % IV SOLN
INTRAVENOUS | Status: AC | PRN
  Administered 2019-11-07: 10 mL/h via INTRAVENOUS

## 2019-11-07 MED ORDER — MIDAZOLAM HCL 2 MG/2ML IJ SOLN
INTRAMUSCULAR | Status: DC | PRN
  Administered 2019-11-07 (×2): 1 mg via INTRAVENOUS

## 2019-11-07 MED ORDER — SODIUM CHLORIDE 0.9 % IV SOLN: 250.0000 mL | INTRAVENOUS | Status: DC | PRN

## 2019-11-07 MED ORDER — IOHEXOL 350 MG/ML SOLN
INTRAVENOUS | Status: DC | PRN
  Administered 2019-11-07: 35 mL

## 2019-11-07 MED ORDER — SODIUM CHLORIDE 0.9% FLUSH
3.0000 mL | Freq: Two times a day (BID) | INTRAVENOUS | Status: DC
  Administered 2019-11-07 – 2019-11-08 (×2): 3 mL via INTRAVENOUS

## 2019-11-07 MED ORDER — ACETAMINOPHEN 325 MG PO TABS
650.0000 mg | ORAL_TABLET | ORAL | Status: DC | PRN
  Filled 2019-11-07: qty 2

## 2019-11-07 MED ORDER — VERAPAMIL HCL 2.5 MG/ML IV SOLN
INTRAVENOUS | Status: DC | PRN
  Administered 2019-11-07: 10 mL via INTRA_ARTERIAL

## 2019-11-07 MED ORDER — MIDAZOLAM HCL 2 MG/2ML IJ SOLN
INTRAMUSCULAR | Status: AC
  Filled 2019-11-07: qty 2

## 2019-11-07 MED ORDER — LIDOCAINE HCL (PF) 1 % IJ SOLN
INTRAMUSCULAR | Status: AC
  Filled 2019-11-07: qty 30

## 2019-11-07 MED ORDER — LABETALOL HCL 5 MG/ML IV SOLN: 10.0000 mg | INTRAVENOUS | Status: AC | PRN

## 2019-11-07 MED ORDER — HEPARIN (PORCINE) IN NACL 1000-0.9 UT/500ML-% IV SOLN
INTRAVENOUS | Status: AC
  Filled 2019-11-07: qty 1000

## 2019-11-07 MED ORDER — SODIUM CHLORIDE 0.9% FLUSH
3.0000 mL | Freq: Two times a day (BID) | INTRAVENOUS | Status: DC
  Administered 2019-11-08: 3 mL via INTRAVENOUS

## 2019-11-07 MED ORDER — VERAPAMIL HCL 2.5 MG/ML IV SOLN
INTRAVENOUS | Status: AC
  Filled 2019-11-07: qty 2

## 2019-11-07 MED ORDER — HEPARIN SODIUM (PORCINE) 1000 UNIT/ML IJ SOLN
INTRAMUSCULAR | Status: AC
  Filled 2019-11-07: qty 1

## 2019-11-07 MED ORDER — SODIUM CHLORIDE 0.9 % IV SOLN: INTRAVENOUS | Status: AC

## 2019-11-07 MED ORDER — FENTANYL CITRATE (PF) 100 MCG/2ML IJ SOLN
INTRAMUSCULAR | Status: AC
  Filled 2019-11-07: qty 2

## 2019-11-07 MED ORDER — HYDRALAZINE HCL 20 MG/ML IJ SOLN: 10.0000 mg | INTRAMUSCULAR | Status: AC | PRN

## 2019-11-07 SURGICAL SUPPLY — 11 items
CATH 5FR JL3.5 JR4 ANG PIG MP (CATHETERS) ×1 IMPLANT
CATH BALLN WEDGE 5F 110CM (CATHETERS) ×1 IMPLANT
DEVICE RAD COMP TR BAND LRG (VASCULAR PRODUCTS) ×1 IMPLANT
GLIDESHEATH SLEND SS 6F .021 (SHEATH) ×1 IMPLANT
GUIDEWIRE .025 260CM (WIRE) ×1 IMPLANT
GUIDEWIRE INQWIRE 1.5J.035X260 (WIRE) IMPLANT
INQWIRE 1.5J .035X260CM (WIRE) ×2
KIT HEART LEFT (KITS) ×1 IMPLANT
PACK CARDIAC CATHETERIZATION (CUSTOM PROCEDURE TRAY) ×2 IMPLANT
SHEATH GLIDE SLENDER 4/5FR (SHEATH) ×1 IMPLANT
TRANSDUCER W/STOPCOCK (MISCELLANEOUS) ×2 IMPLANT

## 2019-11-07 NOTE — Research (Signed)
PHDEV Informed Consent                  Subject Name:   Khaliq Turay   Subject met inclusion and exclusion criteria.  The informed consent form, study requirements and expectations were reviewed with the subject and questions and concerns were addressed prior to the signing of the consent form.  The subject verbalized understanding of the trial requirements.  The subject agreed to participate in the [facility] trial and signed the informed consent.  The informed consent was obtained prior to performance of any protocol-specific procedures for the subject.  A copy of the signed informed consent was given to the subject and a copy was placed in the subject's medical record.   [place] Trudi Morgenthaler, Research Assistant  11/07/2019  10:47 a.m.

## 2019-11-07 NOTE — Progress Notes (Addendum)
Advanced Heart Failure Rounding Note  PCP-Cardiologist: Rozann Lesches, MD   Subjective:    TEE- no endocarditis. EF 20% severe MR (3+)  Denies SOB/ chest pain.   Objective:   Weight Range: 99.1 kg Body mass index is 28.04 kg/m.   Vital Signs:   Temp:  [97.6 F (36.4 C)-98.3 F (36.8 C)] 98.3 F (36.8 C) (07/07 0512) Pulse Rate:  [84-95] 90 (07/07 0512) Resp:  [14-20] 18 (07/07 0512) BP: (117-143)/(57-97) 141/97 (07/07 0512) SpO2:  [94 %-98 %] 98 % (07/07 0512) Weight:  [99.1 kg-103 kg] 99.1 kg (07/07 0512) Last BM Date: 11/06/19  Weight change: Filed Weights   11/06/19 0421 11/06/19 1328 11/07/19 0512  Weight: 101.2 kg 103 kg 99.1 kg    Intake/Output:   Intake/Output Summary (Last 24 hours) at 11/07/2019 0954 Last data filed at 11/06/2019 2112 Gross per 24 hour  Intake 243 ml  Output 200 ml  Net 43 ml      Physical Exam    General:  Well appearing. No resp difficulty HEENT: Normal Neck: Supple. JVP 6-7  . Carotids 2+ bilat; no bruits. No lymphadenopathy or thyromegaly appreciated. Cor: PMI nondisplaced. Regular rate & rhythm. No rubs, gallops or murmurs. Lungs: Clear Abdomen: Soft, nontender, nondistended. No hepatosplenomegaly. No bruits or masses. Good bowel sounds. Extremities: No cyanosis, clubbing, rash, edema Neuro: Alert & orientedx3, cranial nerves grossly intact. moves all 4 extremities w/o difficulty. Affect pleasant   Telemetry   Sr with PVCs Personally reviewed   EKG    N/a   Labs    CBC Recent Labs    11/06/19 1032 11/07/19 0732  WBC 10.9* 11.3*  HGB 9.5* 9.2*  HCT 31.8* 30.8*  MCV 80.7 81.3  PLT 335 573   Basic Metabolic Panel Recent Labs    11/06/19 0333 11/07/19 0648  NA 133* 132*  K 4.7 4.4  CL 97* 99  CO2 25 23  GLUCOSE 159* 141*  BUN 25* 19  CREATININE 1.33* 1.27*  CALCIUM 8.7* 8.8*   Liver Function Tests Recent Labs    11/05/19 0251  AST 17  ALT 18  ALKPHOS 53  BILITOT 0.9  PROT 7.2  ALBUMIN  3.0*   No results for input(s): LIPASE, AMYLASE in the last 72 hours. Cardiac Enzymes No results for input(s): CKTOTAL, CKMB, CKMBINDEX, TROPONINI in the last 72 hours.  BNP: BNP (last 3 results) Recent Labs    11/05/19 0251  BNP 861.0*    ProBNP (last 3 results) No results for input(s): PROBNP in the last 8760 hours.   D-Dimer No results for input(s): DDIMER in the last 72 hours. Hemoglobin A1C No results for input(s): HGBA1C in the last 72 hours. Fasting Lipid Panel No results for input(s): CHOL, HDL, LDLCALC, TRIG, CHOLHDL, LDLDIRECT in the last 72 hours. Thyroid Function Tests No results for input(s): TSH, T4TOTAL, T3FREE, THYROIDAB in the last 72 hours.  Invalid input(s): FREET3  Other results:   Imaging     No results found.   Medications:     Scheduled Medications: . vitamin C  1,000 mg Oral Daily  . aspirin EC  81 mg Oral Daily  . atorvastatin  40 mg Oral Daily  . carvedilol  6.25 mg Oral BID WC  . ferrous sulfate  325 mg Oral Q breakfast  . heparin  5,000 Units Subcutaneous Q8H  . insulin aspart  0-9 Units Subcutaneous TID WC  . pantoprazole  40 mg Oral BID  . polyethylene glycol  17  g Oral BID  . senna-docusate  1 tablet Oral BID  . sodium chloride flush  3 mL Intravenous Q12H  . sodium chloride flush  3 mL Intravenous Q12H  . zolpidem  5 mg Oral QHS     Infusions: . sodium chloride       PRN Medications:  sodium chloride, acetaminophen, ALPRAZolam, magnesium hydroxide, ondansetron (ZOFRAN) IV, sodium chloride flush   Assessment/Plan   1. Acute systolic HF - ECHO 3/29 EF 55-60% - ECHO 7/21 EF 20% Moderate RV dysfunction mod-severe MR - cath 2018 with totally occluded LAD. Stent to Ramus -[facility] today.  - Volume status stable.  - Hold losartan/sprio until after cath -- consider cMRI  2. Fevers, anemia, chills - extensive w/u so far negative. Reports fevers at home but so far none here - ESR 55 - Bcx pending - Unifying  diagnosis remains unclear.  - TEE- no endocarditis.   3. CKD 3b - atrophic left kidney on CT - Creatinine peaked at 1.6. Todays creatinine 1.3  4. CAD with chest tightness - as noted above status post DES to the ramus intermedius in 2018 with medically managed occluded ostial LAD associated with right and left collaterals and a 90% posterolateral stenosis. - no evidence of ACS  5.Myxomatous mitral valve with history of moderate mitral regurgitation. - TEE as above  6. Frequent PVCs - Continue  carvedilol 6.25  bid - consider mexilitene   Plan for RHC/LHC today.   Length of Stay: 4  Amy Clegg, NP  11/07/2019, 9:54 AM  Advanced Heart Failure Team Pager [phone] (M-F; 7a - 4p)  Please contact [facility] for night-coverage after hours (4p -7a ) and weekends on [email or web address]  Patient seen and examined with the above-signed Advanced Practice Provider and/or Housestaff. I personally reviewed laboratory data, imaging studies and relevant notes. I independently examined the patient and formulated the important aspects of the plan. I have edited the note to reflect any of my changes or salient points. I have personally discussed the plan with the patient and/or family.  Results of TEE reviewed with him. No evidence of endocarditis. EF 20% 3+ MR  Continues to feel weak. No fevers in house   General:  Fatigued appearing. No resp difficulty HEENT: normal Neck: supple. no JVD. Carotids 2+ bilat; no bruits. No lymphadenopathy or thryomegaly appreciated. Cor: PMI nondisplaced. Regular rate & rhythm. No rubs, gallops or murmurs. Lungs: clear Abdomen: soft, nontender, nondistended. No hepatosplenomegaly. No bruits or masses. Good bowel sounds. Extremities: no cyanosis, clubbing, rash, edema Neuro: alert & orientedx3, cranial nerves grossly intact. moves all 4 extremities w/o difficulty. Affect pleasant  Cath today with well-compensated hemodynamics. Stable CAD except for 80% lesion in  Ramus prior to stent. Doubt this is contributing to CM. With no angina and CKD 3b have decided to treat medically. Cause of worsening CM remains unclear. Will get cMRI for tomorrow. Continue to follow PVC burden. Etiology of systemic symptoms out of proportion to degree of HF.   Glori Bickers, MD  6:25 PM

## 2019-11-07 NOTE — Care Management Important Message (Signed)
Important Message  Patient Details  Name: Christopher Nelson MRN: [id number] Date of Birth: 05-14-1940   Medicare Important Message Given:  Yes     Shelda Altes 11/07/2019, 8:40 AM

## 2019-11-07 NOTE — Interval H&P Note (Signed)
History and Physical Interval Note:  11/07/2019 5:04 PM  Christopher Nelson  has presented today for surgery, with the diagnosis of heart failure.  The various methods of treatment have been discussed with the patient and family. After consideration of risks, benefits and other options for treatment, the patient has consented to  Procedure(s): RIGHT/LEFT HEART CATH AND CORONARY ANGIOGRAPHY (N/A) and possible coronary angioplasty as a surgical intervention.  The patient's history has been reviewed, patient examined, no change in status, stable for surgery.  I have reviewed the patient's chart and labs.  Questions were answered to the patient's satisfaction.     Vonnetta Akey

## 2019-11-07 NOTE — Progress Notes (Addendum)
Progress Note  Patient Name: Christopher Nelson Date of Encounter: 11/07/2019  [facility] Cardiologist: Rozann Lesches, MD   Subjective   No acute overnight events. No fevers. Patient states he was able to sleep well last night but states he is still having shortness of breath. He states he will wake up all of a sudden and feel like he cannot catch his breath. He is able to lay flat though. No edema. No recurrent chest pain. No night sweats.  Inpatient Medications    Scheduled Meds: . vitamin C  1,000 mg Oral Daily  . aspirin EC  81 mg Oral Daily  . atorvastatin  40 mg Oral Daily  . carvedilol  6.25 mg Oral BID WC  . ferrous sulfate  325 mg Oral Q breakfast  . heparin  5,000 Units Subcutaneous Q8H  . insulin aspart  0-9 Units Subcutaneous TID WC  . pantoprazole  40 mg Oral BID  . polyethylene glycol  17 g Oral BID  . senna-docusate  1 tablet Oral BID  . sodium chloride flush  3 mL Intravenous Q12H  . zolpidem  5 mg Oral QHS   Continuous Infusions: . sodium chloride     PRN Meds: sodium chloride, acetaminophen, ALPRAZolam, magnesium hydroxide, ondansetron (ZOFRAN) IV, sodium chloride flush   Vital Signs    Vitals:   11/06/19 1525 11/06/19 1932 11/06/19 2358 11/07/19 0512  BP: 124/65 126/80 122/68 (!) 141/97  Pulse: 86 95 88 90  Resp: _0 Temp:  98 F (36.7 C) 97.6 F (36.4 C) 98.3 F (36.8 C)  TempSrc:  Oral Oral Oral  SpO2: 95% 98% 98% 98%  Weight:    99.1 kg  Height:        Intake/Output Summary (Last 24 hours) at 11/07/2019 0835 Last data filed at 11/06/2019 2112 Gross per 24 hour  Intake 243 ml  Output 200 ml  Net 43 ml   Last 3 Weights 11/07/2019 11/06/2019 11/06/2019  Weight (lbs) 218 lb 6.4 oz 227 lb 223 lb 3.2 oz  Weight (kg) 99.066 kg 102.967 kg 101.243 kg      Telemetry    Normal sinus rhythm. Rates in the 80's to low 100's. Frequent PVCs - sometimes in bigeminy pattern and some couplet/triplets.  - Personally Reviewed  ECG    No new  ECG tracing today. - Personally Reviewed  Physical Exam   GEN: No acute distress.   Neck:  Supple. No JVD Cardiac: RRR with ectopy from PVCs. No significant murmur appreciated. No rubs or gallops.  Respiratory: Clear to auscultation bilaterally.  GI: Soft, non-tender, non-distended  MS: No lower extremity edema. No deformity. Skin: Warm and dry. Neuro:  No focal deficits. Psych: Normal affect. Responds appropriately.  Labs    High Sensitivity Troponin:   Recent Labs  Lab 11/03/19 1506  TROPONINIHS 29*      Chemistry Recent Labs  Lab 11/04/19 0317 11/04/19 0757 11/05/19 0251 11/06/19 0333 11/07/19 0648  NA   < >  --  136 133* 132*  K   < >  --  5.1 4.7 4.4  CL   < >  --  100 97* 99  CO2   < >  --  _1 GLUCOSE   < >  --  166* 159* 141*  BUN   < >  --  28* 25* 19  CREATININE   < >  --  1.62* 1.33* 1.27*  CALCIUM   < >  --  8.6* 8.7* 8.8*  PROT  --  7.1 7.2  --   --   ALBUMIN  --  3.0* 3.0*  --   --   AST  --  15 17  --   --   ALT  --  18 18  --   --   ALKPHOS  --  54 53  --   --   BILITOT  --  0.6 0.9  --   --   GFRNONAA   < >  --  40* 51* 54*  GFRAA   < >  --  46* 59* >60  ANIONGAP   < >  --  _0 < > = values in this interval not displayed.     Hematology Recent Labs  Lab 11/05/19 0251 11/06/19 1032 11/07/19 0732  WBC 15.0* 10.9* 11.3*  RBC 4.02* 3.94* 3.79*  HGB 9.7* 9.5* 9.2*  HCT 33.0* 31.8* 30.8*  MCV 82.1 80.7 81.3  MCH 24.1* 24.1* 24.3*  MCHC 29.4* 29.9* 29.9*  RDW 15.1 15.2 15.2  PLT 328 335 319    BNP Recent Labs  Lab 11/05/19 0251  BNP 861.0*     DDimer No results for input(s): DDIMER in the last 168 hours.   Radiology    CT ABDOMEN PELVIS WO CONTRAST  Result Date: 11/05/2019 CLINICAL DATA:  Abdominal distention and fever. EXAM: CT ABDOMEN AND PELVIS WITHOUT CONTRAST TECHNIQUE: Multidetector CT imaging of the abdomen and pelvis was performed following the standard protocol without IV contrast. COMPARISON:  08/30/2019  FINDINGS: Lower chest: Small to moderate bilateral pleural effusions with associated bibasilar atelectasis which is new. Calcified plaque over the right coronary artery as well as the thoracic aorta. Hepatobiliary: Previous cholecystectomy. Liver and biliary tree are normal. Pancreas: Normal. Spleen: Normal. Adrenals/Urinary Tract: Stable subcentimeter left adrenal nodule likely an adenoma. Right adrenal gland is normal. Stable left renal atrophy with small stone over the lower pole. Stable prominence of the left renal pelvis. Mild stable prominence of the right intrarenal collecting system. No evidence of right renal stones. Stable exophytic right renal cyst. Right ureter is normal. There is a 9 mm stone over the bladder which has moved from the midline to the region of the right UVJ. Stomach/Bowel: Stomach and small bowel are normal. Appendix is normal. Moderate diverticulosis of the colon most prominent over the descending and sigmoid colon. No active inflammation. Vascular/Lymphatic: Moderate calcified plaque of abdominal aorta which is normal in caliber. No adenopathy. Reproductive: Stable prominence of the prostate gland and seminal vesicles. Other: No significant free fluid.  No free peritoneal air. Musculoskeletal: Degenerative change of the spine and left hip. Right hip prosthesis intact. IMPRESSION: 1.  No acute findings in the abdomen/pelvis. 2. New small to moderate bilateral pleural effusions with associated bibasilar atelectasis. 3. Chronic left renal atrophy with nonobstructing left renal stone. Mild stable prominence of the right intrarenal collecting system and stable right renal cyst. 9 mm bladder stone has moved from the midline to the region of the right UVJ. 4.  Moderate colonic diverticulosis without active inflammation. 5. Aortic Atherosclerosis (ICD10-I70.0). Atherosclerotic coronary artery disease. 6.  Stable prominence of the prostate gland and seminal vesicles. Electronically Signed   By:  Marin Olp M.D.   On: 11/05/2019 17:14    Cardiac Studies   TTE 11/04/2019: Impressions: 1. Left ventricular ejection fraction, by estimation, is 25 to 30%. The  left ventricle has severely decreased function. The left ventricle  demonstrates regional wall  motion abnormalities (see scoring  diagram/findings for description). There is mild left  ventricular hypertrophy. Left ventricular diastolic parameters are  indeterminate.  2. Right ventricular systolic function reduced. The right ventricular  size is normal. Tricuspid regurgitation signal is inadequate for assessing  PA pressure.  3. Left atrial size was severely dilated.  4. The mitral valve is abnormal, restricted posterior leaflet motion,  mild annular calcification. Moderate to severe mitral valve regurgitation,  eccentric and two jets noted.  5. The aortic valve is tricuspid. Aortic valve regurgitation is trivial.  Mild to moderate aortic valve sclerosis/calcification is present, without  any evidence of aortic stenosis.  6. Unable to estimate CVP.  _______________  TEE 11/06/2019: Findings: LEFT VENTRICLE: EF = 20-25%. Global HK RIGHT VENTRICLE: Normal size and function.  LEFT ATRIUM: Mildly dilated. LA diameter 4.3 cm LEFT ATRIAL APPENDAGE: No thrombus.  RIGHT ATRIUM: Normal AORTIC VALVE:  Trileaflet. Mildly calcified. Triv AI. No vegetation MITRAL VALVE:    Normal. Myxomatous. Posterior leaflet is thickened and has mild prolapse. Severe central MR. No vegetation TRICUSPID VALVE: Normal. Triv TR. No vegetation PULMONIC VALVE: Normal. Mild PI. No vegetation  INTERATRIAL SEPTUM: Lipotamous hypertrophy. No PFO or ASD. PERICARDIUM: No effusion DESCENDING AORTA: Moderate plaque  Conclusion: No TEE evidence of endocarditis.   Patient Profile     79 y.o. male with a history of CAD s/p DES to ramus in 2018 (occluded LAD at that time), acute on chronic systolic CHF, severe MR, hypertension, CKD stage III, GERD,  arthritis, and recent fevers/night sweats/weight loss who presented to [facility] on 11/02/2019 for evaluation of shortness of breath and chest pain and was ultimately transferred to [facility] for further work-up.  Assessment & Plan    Acute on Chronic Systolic CHF - TTE showed LVEF of 25-30% with multiple wall motion abnormalities and moderate to severe MR. EF down from 55-60% in 03/2019. - TEE showed LVEF of 20-25% with global hypokinesis with mild mitral prolapse of posterior leaflet and severe central MR. - Last dose of IV Lasix was on 11/05/2019. Net negative 2.2 L since admission. Weight down 9 lbs since yesterday and 12 lbs since admission. Renal function stable. - Appears euvolemic. Continue to hold off on any additional diuresis until cath. - Continue Coreg 6.69m twice daily.  - Initially on Losartan but this has since been stopped. Presumably due to rise in creatinine. - Continue to monitor daily weight, strict I/O's, and renal function. - Will ultimately need right/left heart catheterization. Looks like he has been placed on the board today with Dr. BHaroldine Laws The patient understands that risks include but are not limited to stroke (1 in 1000), death (1 in 137, kidney failure [usually temporary] (1 in 500), bleeding (1 in 200), allergic reaction [possibly serious] (1 in 200), and agrees to proceed.   Chest Pain with Known CAD - S/p DES to ramus in 2018. LAD noted to be occluded at that time.  - High-sensitivity troponin only minimally elevated at 29. No repeat. Suspect due to demand ischemia. - No recurrent chest pain. - Continue aspirin, beta-blocker, and high-intensity statin.  - Will need right/left heart cath as above.  Severe Mitral Regurgitation - Noted on TEE. - Will ultimately need right/left heart cath.  Frequent PVCs - Continue to have frequent PVCs. - May need to increase Coreg to 12.547mtwice daily.  Fevers/ Anemia/ Chills - No fevers since admission. - WBC  9.7 >> 11.6 >> 15.0 >> 10.9 >> 11.3. - ESR 55. LDH  139. - Blood cultures negative to date. - Urine culture with insignificant growth (<10,000 colonies/mL). - TEE showed no evidence of endocarditis. - Work-up at [facility] unremarkable: ANA, ANCA, metanephrines, 5-HIAA, anti-DNA, anti mitochondrial Abs, Sjogrens, RA, SPEP - all negative.  - HIV negative here. - ID following.   For questions or updates, please contact [place] Please consult [email or web address] for contact info under        Signed, Darreld Mclean, PA-C  11/07/2019, 8:35 AM    Personally seen and examined. Agree with above.  He is still occasionally having the sensations of needing to take in a deep breath or being startled with a deep breath.  Wonders if it is anxiety.  Overall on exam, lungs sound clear, heart regular rate and rhythm rare ectopy.  No significant edema.  TEE-no endocarditis.  Severe mitral regurgitation.  Assessment and plan:  Coronary artery disease -Planning for right and left heart catheterization today.  Dr. Haroldine Laws.  Previously has occluded LAD, stent to ramus.  This was back in 2018.  Could there be progression?  Acute systolic heart failure -EF now 20%, severe mitral regurgitation.  Volume status will be assessed with right heart catheterization.  Appears euvolemic.  Weight is down about 12 pounds since admission.  Severe mitral regurgitation -Likely a component of mitral regurgitation as a result of cardiomyopathy as well as myxomatous mitral valve.  Thankfully, no evidence of endocarditis.  PVCs -Carvedilol currently 6.25 twice a day.  Occasional PVCs noted on telemetry.  No ventricular tachycardia.  Chronic kidney disease stage IIIb -Creatinine currently 1.7, within range for him.  Dye sparing during catheterization.  Holding losartan.  Fevers chills -Thankfully, no signs of endocarditis.  No fevers while in house.  Does not report any mold or any other allergic substances in his  house.  Has had extensive work-up at [facility] serologies are negative.    Prior GI bleed in April 2021 -Gastric ulcer.  No further recurrence.  Candee Furbish, MD

## 2019-11-07 NOTE — Progress Notes (Signed)
[facility]  PROGRESS NOTE  KAYLAN FRIEDMANN AQT:[id number] DOB: 1940/08/03 DOA: 11/03/2019 PCP: Rory Percy, MD   Brief HPI:   79 year old male with history of hypertension, GERD, CAD, s/p CABG 2020 who was transferred from [facility] for work-up of chest pain.  Patient has been follow with [facility] hematology for chronic anemia, last seen on 6/24.  Over the past several weeks patient has been experiencing sweats, chills and fever after dinner associated with tightness and bloating.  Work-up including 5 HIAA, anticentromere antibody, ANCA anti-DNA, RF, ANA, cortisol, metanephrines, B12, SPEP, gastrin which were all found to be unremarkable.  Initial WBC count at [facility] was elevated at 15, hemoglobin was 9.5.  Chest x-ray showed central vascular congestion with mild interstitial opacities suggesting edema.  UA also was unremarkable    Subjective   Patient seen and examined, no fever in the hospital.   Assessment/Plan:     1. Fever-patient reports fever off and on for several weeks.  He is currently afebrile.  He had no fever during this hospitalization.  HIV negative, LFTs normal, chest x-ray and UA at [facility] were negative.  Urine culture grew insignificant growth.  CT abdomen/pelvis negative for acute infectious process.  It showed 9 mm bladder stone moved from midline to the region of the right UVJ.  Blood cultures negative to date.  Final result is pending.  TEE is negative for endocarditis.  ID has been consulted. 2. Adrenal adenoma-CT abdomen pelvis shows stable subcentimeter left adrenal nodule.  Plasma metanephrine, DHEA, aldosterone renin ordered.  Results are currently pending. 3. Hypertension-continue metoprolol.  Norvasc and Cozaar on hold. 4. Acute kidney injury on CKD stage IIIb-baseline creatinine 1.3.  Creatinine improved to 1.6.  And now back to baseline 1.27. 5. Diabetes mellitus type 2-continue sliding scale insulin with NovoLog. 6. Acute systolic heart  failure-heart failure team consulted.  Plan per cardiology. 7. Bladder stone-CT scan shows 9 mm bladder stone moved from midline to region of right UVJ, Dr. Jerald Kief discussed with urology.  Patient to follow-up with urology in couple of months.    SpO2: 98 % O2 Flow Rate (L/min): 2 L/min   COVID-19 Labs  Recent Labs    11/05/19 0251  LDH 139    Lab Results  Component Value Date   SARSCOV2NAA NEGATIVE 11/05/2019   [place] NEGATIVE 08/29/2019     CBG: Recent Labs  Lab 11/06/19 1724 11/06/19 2044 11/07/19 0719 11/07/19 1218 11/07/19 1610  GLUCAP 207* 147* 131* 134* 136*    CBC: Recent Labs  Lab 11/03/19 1506 11/03/19 1506 11/04/19 0317 11/04/19 0317 11/05/19 0251 11/06/19 1032 11/07/19 0732 11/07/19 1715 11/07/19 1719  WBC 9.7  --  11.6*  --  15.0* 10.9* 11.3*  --   --   NEUTROABS  --   --  8.7*  --   --   --   --   --   --   HGB 8.8*   < > 8.5*   < > 9.7* 9.5* 9.2* 9.2* 10.2*   9.2*  HCT 30.1*   < > 29.4*   < > 33.0* 31.8* 30.8* 27.0* 30.0*   27.0*  MCV 82.5  --  82.1  --  82.1 80.7 81.3  --   --   PLT 287  --  253  --  328 335 319  --   --    < > = values in this interval not displayed.    Basic Metabolic Panel: Recent Labs  Lab  11/03/19 1506 11/03/19 1506 11/03/19 1841 11/04/19 0317 11/04/19 0317 11/05/19 0251 11/06/19 0333 11/07/19 0648 11/07/19 1715 11/07/19 1719  NA 138   < >  --  137   < > 136 133* 132* 141 137   142  K 3.8   < >  --  4.6   < > 5.1 4.7 4.4 4.0 4.6   4.0  CL 103  --   --  102  --  100 97* 99  --   --   CO2 28  --   --  28  --  27 25 23   --   --   GLUCOSE 149*  --   --  164*  --  166* 159* 141*  --   --   BUN 23  --   --  22  --  28* 25* 19  --   --   CREATININE 1.44*   < > 1.52* 1.41*  --  1.62* 1.33* 1.27*  --   --   CALCIUM 8.5*  --   --  8.4*  --  8.6* 8.7* 8.8*  --   --    < > = values in this interval not displayed.     Liver Function Tests: Recent Labs  Lab 11/04/19 0757 11/05/19 0251  AST 15 17  ALT 18  18  ALKPHOS 54 53  BILITOT 0.6 0.9  PROT 7.1 7.2  ALBUMIN 3.0* 3.0*        DVT prophylaxis: Heparin  Code Status: Full code  Family Communication: Discussed with wife at bedside          Scheduled medications:   vitamin C  1,000 mg Oral Daily   aspirin EC  81 mg Oral Daily   atorvastatin  40 mg Oral Daily   carvedilol  6.25 mg Oral BID WC   ferrous sulfate  325 mg Oral Q breakfast   heparin  5,000 Units Subcutaneous Q8H   heparin  5,000 Units Subcutaneous Q8H   insulin aspart  0-9 Units Subcutaneous TID WC   pantoprazole  40 mg Oral BID   polyethylene glycol  17 g Oral BID   senna-docusate  1 tablet Oral BID   sodium chloride flush  3 mL Intravenous Q12H   sodium chloride flush  3 mL Intravenous Q12H   sodium chloride flush  3 mL Intravenous Q12H   zolpidem  5 mg Oral QHS    Antibiotics:   Anti-infectives (From admission, onward)   None       Objective   Vitals:   11/07/19 1727 11/07/19 1732 11/07/19 1736 11/07/19 1812  BP: 126/77 126/84 130/80 138/78  Pulse: 90 89 87 84  Resp: 14 18 (!) 21 20  Temp:    97.6 F (36.4 C)  TempSrc:    Oral  SpO2: 98% 96% 96% 98%  Weight:      Height:        Intake/Output Summary (Last 24 hours) at 11/07/2019 1853 Last data filed at 11/06/2019 2112 Gross per 24 hour  Intake 243 ml  Output 200 ml  Net 43 ml    07/05 1901 - 07/07 0700 In: 657 [P.O.:480; I.V.:6] Out: 475 [Urine:475]  Filed Weights   11/06/19 0421 11/06/19 1328 11/07/19 0512  Weight: 101.2 kg 103 kg 99.1 kg    Physical Examination:    General: Appears in no acute distress  Cardiovascular: S1-S2, regular, no murmur auscultated  Respiratory: Clear to auscultation bilaterally  Abdomen: Soft, nontender, no  organomegaly  Extremities: No edema in the lower extremities  Neurologic: Appears in no acute distress    Data Reviewed:   Recent Results (from the past 240 hour(s))  Culture, blood (routine x 2)     Status:  None (Preliminary result)   Collection Time: 11/05/19  8:37 AM   Specimen: BLOOD  Result Value Ref Range Status   Specimen Description BLOOD BLOOD LEFT ARM  Final   Special Requests   Final    BOTTLES DRAWN AEROBIC ONLY Blood Culture adequate volume   Culture   Final    NO GROWTH 2 DAYS Performed at [facility], [address], [place], [place] [address]    Report Status PENDING  Incomplete  Culture, blood (routine x 2)     Status: None (Preliminary result)   Collection Time: 11/05/19  8:37 AM   Specimen: BLOOD  Result Value Ref Range Status   Specimen Description BLOOD BLOOD LEFT ARM  Final   Special Requests   Final    BOTTLES DRAWN AEROBIC ONLY Blood Culture adequate volume   Culture   Final    NO GROWTH 2 DAYS Performed at [facility], [place] [address], [place], [place] [address]    Report Status PENDING  Incomplete  Urine Culture     Status: Abnormal   Collection Time: 11/05/19  2:10 PM   Specimen: Urine, Random  Result Value Ref Range Status   Specimen Description URINE, RANDOM  Final   Special Requests NONE  Final   Culture (A)  Final    <10,000 COLONIES/mL INSIGNIFICANT GROWTH Performed at [facility], [place] [address] [place], [place] [address]    Report Status 11/06/2019 FINAL  Final  SARS Coronavirus 2 by RT PCR (hospital order, performed in [facility] hospital lab) Nasopharyngeal Nasopharyngeal Swab     Status: None   Collection Time: 11/05/19  6:26 PM   Specimen: Nasopharyngeal Swab  Result Value Ref Range Status   SARS Coronavirus 2 NEGATIVE NEGATIVE Final    Comment: (NOTE) SARS-CoV-2 target nucleic acids are NOT DETECTED.  The SARS-CoV-2 RNA is generally detectable in upper and lower respiratory specimens during the acute phase of infection. The lowest concentration of SARS-CoV-2 viral copies this assay can detect is 250 copies / mL. A negative result does not preclude SARS-CoV-2 infection and should not be used as the sole basis for  treatment or other patient management decisions.  A negative result may occur with improper specimen collection / handling, submission of specimen other than nasopharyngeal swab, presence of viral mutation(s) within the areas targeted by this assay, and inadequate number of viral copies (<250 copies / mL). A negative result must be combined with clinical observations, patient history, and epidemiological information.  Fact Sheet for Patients:   StrictlyIdeas.no  Fact Sheet for Healthcare Providers: BankingDealers.co.za  This test is not yet approved or  cleared by the [place] FDA and has been authorized for detection and/or diagnosis of SARS-CoV-2 by FDA under an Emergency Use Authorization (EUA).  This EUA will remain in effect (meaning this test can be used) for the duration of the COVID-19 declaration under Section 564(b)(1) of the Act, 21 U.S.C. section 360bbb-3(b)(1), unless the authorization is terminated or revoked sooner.  Performed at [facility], [place] [address] [place], [place] [address]     No results for input(s): LIPASE, AMYLASE in the last 168 hours. No results for input(s): AMMONIA in the last 168 hours.  Cardiac Enzymes: No results for input(s): CKTOTAL, CKMB, CKMBINDEX, TROPONINI in the last 168 hours. BNP (last 3 results) Recent Labs    11/05/19 0251  BNP 861.0*    ProBNP (last 3 results) No results for input(s): PROBNP in the last 8760 hours.  Studies:  CARDIAC CATHETERIZATION  Result Date: 11/07/2019  Ost LAD lesion is 100% stenosed.  Ost LAD to Prox LAD lesion is 100% stenosed.  Previously placed Lat Ramus drug eluting stent is widely patent.  Balloon angioplasty was performed.  2nd RPL lesion is 90% stenosed.  1st RPL lesion is 40% stenosed.  1st Mrg lesion is 50% stenosed.  Ramus-1 lesion is 80% stenosed.  Previously placed Ramus-2 stent (unknown type) is widely patent.  Findings:  Ao = 120/63 (88) LV =  118/13 RA = 1 RV = 28/3 PA = 28/10 (19) PCW = 12 Fick cardiac output/index = 8.5/3.8 PVR = 0.8 WU FA sat = 97% PA sat = 67%, 71% Assessment: 1. CAD with chronic total occlusion of LAD. 2. 80% lesion in ramus prior to previous stent (stent widely patent) 3. EF 20% with global HK 4. Well compensated hemodynamics Plan/Discussion: He has significant stenosis in ramus just prior to stent . Doubt this is causing cardiomyopathy. Given solitary kidney and CKD decision made to treat medically. D/w Dr. [place]. Hemodynamics well compensated. Glori Bickers, MD 6:06 PM       Oswald Hillock   [facility] If 7PM-7AM, please contact night-coverage at [email or web address], Office  [phone]   11/07/2019, 6:53 PM  LOS: 4 days

## 2019-11-08 ENCOUNTER — Encounter (HOSPITAL_COMMUNITY): Payer: Self-pay | Admitting: Internal Medicine

## 2019-11-08 ENCOUNTER — Inpatient Hospital Stay (HOSPITAL_COMMUNITY): Payer: Medicare Other

## 2019-11-08 DIAGNOSIS — I340 Nonrheumatic mitral (valve) insufficiency: Secondary | ICD-10-CM

## 2019-11-08 DIAGNOSIS — N210 Calculus in bladder: Secondary | ICD-10-CM

## 2019-11-08 DIAGNOSIS — D3500 Benign neoplasm of unspecified adrenal gland: Secondary | ICD-10-CM

## 2019-11-08 DIAGNOSIS — N179 Acute kidney failure, unspecified: Secondary | ICD-10-CM

## 2019-11-08 DIAGNOSIS — I493 Ventricular premature depolarization: Secondary | ICD-10-CM

## 2019-11-08 DIAGNOSIS — E118 Type 2 diabetes mellitus with unspecified complications: Secondary | ICD-10-CM

## 2019-11-08 HISTORY — DX: Chronic kidney disease, unspecified: N17.9

## 2019-11-08 HISTORY — DX: Type 2 diabetes mellitus with unspecified complications: E11.8

## 2019-11-08 LAB — BASIC METABOLIC PANEL
Anion gap: 10 (ref 5–15)
BUN: 23 mg/dL (ref 8–23)
CO2: 23 mmol/L (ref 22–32)
Calcium: 8.8 mg/dL — ABNORMAL LOW (ref 8.9–10.3)
Chloride: 102 mmol/L (ref 98–111)
Creatinine, Ser: 1.36 mg/dL — ABNORMAL HIGH (ref 0.61–1.24)
GFR calc Af Amer: 57 mL/min — ABNORMAL LOW (ref >60)
GFR calc non Af Amer: 49 mL/min — ABNORMAL LOW (ref >60)
Glucose, Bld: 120 mg/dL — ABNORMAL HIGH (ref 70–99)
Potassium: 4.2 mmol/L (ref 3.5–5.1)
Sodium: 135 mmol/L (ref 135–145)

## 2019-11-08 LAB — GLUCOSE, CAPILLARY
Glucose-Capillary: 127 mg/dL — ABNORMAL HIGH (ref 70–99)
Glucose-Capillary: 249 mg/dL — ABNORMAL HIGH (ref 70–99)

## 2019-11-08 LAB — DHEA-SULFATE: DHEA-SO4: 16.5 ug/dL — ABNORMAL LOW (ref 20.8–226.4)

## 2019-11-08 LAB — CBC
HCT: 31.2 % — ABNORMAL LOW (ref 39.0–52.0)
Hemoglobin: 9.3 g/dL — ABNORMAL LOW (ref 13.0–17.0)
MCH: 23.8 pg — ABNORMAL LOW (ref 26.0–34.0)
MCHC: 29.8 g/dL — ABNORMAL LOW (ref 30.0–36.0)
MCV: 80 fL (ref 80.0–100.0)
Platelets: 311 10*3/uL (ref 150–400)
RBC: 3.9 MIL/uL — ABNORMAL LOW (ref 4.22–5.81)
RDW: 15.4 % (ref 11.5–15.5)
WBC: 11.6 10*3/uL — ABNORMAL HIGH (ref 4.0–10.5)
nRBC: 0 % (ref 0.0–0.2)

## 2019-11-08 MED ORDER — METFORMIN HCL 500 MG PO TABS: 500.0000 mg | ORAL_TABLET | Freq: Two times a day (BID) | ORAL | 11 refills | Status: AC

## 2019-11-08 MED ORDER — FERROUS SULFATE 325 (65 FE) MG PO TABS: 325.0000 mg | ORAL_TABLET | Freq: Every day | ORAL | 0 refills | Status: AC

## 2019-11-08 MED ORDER — GADOBUTROL 1 MMOL/ML IV SOLN
10.0000 mL | Freq: Once | INTRAVENOUS | Status: AC | PRN
  Administered 2019-11-08: 10 mL via INTRAVENOUS

## 2019-11-08 MED ORDER — PANTOPRAZOLE SODIUM 40 MG PO TBEC: 40.0000 mg | DELAYED_RELEASE_TABLET | Freq: Two times a day (BID) | ORAL | 0 refills | Status: AC

## 2019-11-08 MED ORDER — SPIRONOLACTONE 25 MG PO TABS: 12.5000 mg | ORAL_TABLET | Freq: Every day | ORAL | 2 refills | Status: AC

## 2019-11-08 MED ORDER — BLOOD GLUCOSE MONITOR KIT: PACK | 1 refills | Status: AC

## 2019-11-08 MED ORDER — SPIRONOLACTONE 12.5 MG HALF TABLET
12.5000 mg | ORAL_TABLET | Freq: Every day | ORAL | Status: DC
  Administered 2019-11-08: 12.5 mg via ORAL
  Filled 2019-11-08 (×2): qty 1

## 2019-11-08 MED ORDER — BLOOD GLUCOSE MONITOR KIT: PACK | 0 refills | Status: DC

## 2019-11-08 MED ORDER — CARVEDILOL 12.5 MG PO TABS: 12.5000 mg | ORAL_TABLET | Freq: Two times a day (BID) | ORAL | 2 refills | Status: AC

## 2019-11-08 MED ORDER — CARVEDILOL 12.5 MG PO TABS: 12.5000 mg | ORAL_TABLET | Freq: Two times a day (BID) | ORAL | Status: DC

## 2019-11-08 MED ORDER — CARVEDILOL 12.5 MG PO TABS
12.5000 mg | ORAL_TABLET | Freq: Two times a day (BID) | ORAL | Status: DC
  Administered 2019-11-08: 12.5 mg via ORAL
  Filled 2019-11-08: qty 1

## 2019-11-08 MED FILL — Heparin Sod (Porcine)-NaCl IV Soln 1000 Unit/500ML-0.9%: INTRAVENOUS | Qty: 1000 | Status: AC

## 2019-11-08 MED FILL — CARVEDILOL 12.5 MG TABLET: 12.5 | 30 days supply | Qty: 60 | Fill #0

## 2019-11-08 MED FILL — PANTOPRAZOLE SOD DR 40 MG T: 40 | 30 days supply | Qty: 60 | Fill #0

## 2019-11-08 MED FILL — FERROUS SULFATE 325 MG TAB: 325 (65 FE) | 30 days supply | Qty: 30 | Fill #0

## 2019-11-08 MED FILL — SPIRONOLACTONE 25 MG TABLET: 25 | 30 days supply | Qty: 15 | Fill #0

## 2019-11-08 MED FILL — metFORMIN HCL 500 MG TABS: 500 | 30 days supply | Qty: 60 | Fill #0

## 2019-11-08 NOTE — Discharge Summary (Addendum)
Discharge Summary    Patient ID: Christopher Nelson MRN: [id number]; DOB: 1940-05-12  Admit date: 11/03/2019 Discharge date: 11/08/2019  Primary Care Provider: Rory Percy, MD  Primary Cardiologist: Christopher Lesches, MD  Primary Electrophysiologist:  None   Discharge Diagnoses    Principal Problem:   Acute on chronic combined systolic and diastolic CHF (congestive heart failure) [facility]) Active Problems:   Chest pain of uncertain etiology   CAD in native artery   Severe mitral regurgitation   Frequent PVCs   Acute-on-chronic kidney injury (Christopher Nelson)   Type 2 diabetes mellitus with complication, without long-term current use of insulin (HCC)   Anemia   Fever   Adrenal adenoma   Bladder stone    Diagnostic Studies/Procedures    Transthoracic Echocardiogram 11/04/2019: Impressions: 1. Left ventricular ejection fraction, by estimation, is 25 to 30%. The  left ventricle has severely decreased function. The left ventricle  demonstrates regional wall motion abnormalities (see scoring  diagram/findings for description). There is mild left  ventricular hypertrophy. Left ventricular diastolic parameters are  indeterminate.  2. Right ventricular systolic function reduced. The right ventricular  size is normal. Tricuspid regurgitation signal is inadequate for assessing  PA pressure.  3. Left atrial size was severely dilated.  4. The mitral valve is abnormal, restricted posterior leaflet motion,  mild annular calcification. Moderate to severe mitral valve regurgitation,  eccentric and two jets noted.  5. The aortic valve is tricuspid. Aortic valve regurgitation is trivial.  Mild to moderate aortic valve sclerosis/calcification is present, without  any evidence of aortic stenosis.  6. Unable to estimate CVP.  _____________  Transesophageal Echocardiogram 11/06/2019: Findings: LEFT VENTRICLE: EF =20-25%.Global HK RIGHT VENTRICLE: Normal size and function.  LEFT ATRIUM:Mildly  dilated. LA diameter 4.3 cm LEFT ATRIAL APPENDAGE: No thrombus.  RIGHT ATRIUM:Normal AORTIC VALVE: Trileaflet. Mildly calcified. Triv AI. No vegetation MITRAL VALVE: Normal. Myxomatous. Posterior leaflet is thickened and has mild prolapse. Severe central MR. No vegetation TRICUSPID VALVE: Normal.Triv TR. No vegetation PULMONIC VALVE:Normal. Mild PI. No vegetation INTERATRIAL SEPTUM:Lipotamous hypertrophy.No PFO or ASD. PERICARDIUM: No effusion DESCENDING AORTA:Moderate plaque  Conclusion: No TEE evidence of endocarditis. _______________  Right/Left Heart Catheterization 11/07/2019:  Ost LAD lesion is 100% stenosed.  Ost LAD to Prox LAD lesion is 100% stenosed.  Previously placed Lat Ramus drug eluting stent is widely patent.  Balloon angioplasty was performed.  2nd RPL lesion is 90% stenosed.  1st RPL lesion is 40% stenosed.  1st Mrg lesion is 50% stenosed.  Ramus-1 lesion is 80% stenosed.  Previously placed Ramus-2 stent (unknown type) is widely patent.   Findings: Ao = 120/63 (88) LV =  118/13 RA = 1 RV = 28/3 PA = 28/10 (19) PCW = 12 Fick cardiac output/index = 8.5/3.8 PVR = 0.8 WU FA sat = 97% PA sat = 67%, 71%  Assessment: 1. CAD with chronic total occlusion of LAD.  2. 80% lesion in ramus prior to previous stent (stent widely patent) 3. EF 20% with global HK 4. Well compensated hemodynamics  Plan/Discussion: He has significant stenosis in ramus just prior to stent . Doubt this is causing cardiomyopathy. Given solitary kidney and CKD decision made to treat medically. D/w Dr. Martinique.   Hemodynamics well compensated.  Diagnostic Dominance: Right  _______________  Cardiac MRI 11/08/2019: Impression: 1. Moderately dilated left ventricle with moderate concentric left ventricular hypertrophy, and severely decreased systolic function (LVEF = 27%). There is global hypokinesis and akinesis of the basal and mid inferoseptal, anteroseptal and  anterior walls  and apical septal and anterior walls. There is no late gadolinium enhancement in the left ventricular myocardium.  ECV is elevated at 37%. Consider PYP scan for evaluation of TTR amyloidosis.  2. Normal right ventricular size, thickness and systolic function (RVEF = 48%). There are no regional wall motion abnormalities.  3. Moderately to severely dilated left atrium, normal right atrial size.  4. Normal size of the aortic root, ascending aorta and pulmonary artery.  5. Moderate mitral, mild aortic and trivial tricuspid regurgitation.  6. Normal pericardium.  Mild circumferential pericardial effusion.  7. Bilateral moderate pleural effusions.     History of Present Illness     Christopher Nelson is a 79 y.o. male with a history of CAD s/p DES to ramus in 2018 (occluded LAD at that time), acute on chronic systolic CHF, severe MR, hypertension, CKD stage III, GERD, gastric ulcer with recent GI bleed in 08/2019, arthritis, and recent fevers/night sweats/weight loss who presented to [facility] on 11/02/2019 for evaluation of shortness of breath and chest pain and was ultimately transferred to [facility] for further work-up.  Most recent Echo from 03/2019 showed LVEF of 55-60% with grade I diastolic dysfunction and mitral valve prolapse with moderate MR.   Over the last 4-6 weeks prior to presentation, patient reports being ery weak after lung and having fevers every afternoon/evening usually around 100 to 100 but has been up to 104 degrees F. Fever resolves with Tylenol and placing ice on his face. He also reports night sweats on some nights without fevers. He has been seen by hematology in [facility] for further evaluation. No cause has been found so far.   Patient presented to [facility] on 11/02/2019 for evaluation of shortness of breath and substernal non-radiating chest pain increased with breathing as well as upper abdominal pain.   He also had concerns about  anemia.  Work-up at [facility]: Chest x-ray with mild cardiomegaly, central vascular congestion with mild perihilar intestinal opacity suggestive of mild edema. No pneumonia. Abdominal x-ray showed non-obstructive gas pattern and mild to moderate stool but no radiopaque calculi. Pro BNP 4,437. Troponin <0.017. WBC 15.7, Hgb 9.5, Plts 306. Na 139, K 3.9, Cl 102, CO2 27, BUN 27, Cr 1.56. Albumin 3. LFTs otherwise normal. TSH 0.765. Mg 2.1. INR 1.15. Urinalysis positive for protein.   Decision ultimately made to transfer patient to [facility] for further evaluation.   Hospital Course     Consultants: [facility] and Infectious Disease  Acute on Chronic Systolic CHF TTE on 10/03/2295  showed LVEF of 25-30% with multiple wall motion abnormalities and moderate to severe MR. EF down from 55-60% in 03/2019. TEE showed LVEF of 20-25% with global hypokinesis with mild mitral prolapse of posterior leaflet and severe central MR. EF down from 55-60% in 03/2019. Patient was initially diuresed with IV Lasix. Patient responded well to this and it was stopped on 7/5. Dr. Haroldine Laws with [facility] consulted to assist with work-up. Patient underwent right/left heart cath on 7/7 which showed known 100% stenosis of ostial/poximal LAD, 80% stenosis of ramus prior to previous stent which was widely patent, and 90% stenosis in the 2nd RPL. Mild non-obstructive disease noted in 1st RPL and 1st Mrg. Hemodynamics were well compensated. It was not not felt that stenosis in ramus prior to stent was the cause of his cardiomyopathy. Given solitary kidney and CKD decision made to treat medically. Cardiac MRI ordered and showed no late gadolinium enhancement in the LV myocardium. ECV is  elevated at 37%. PYP scan was recommended for further evaluation of TTR amyloidosis. Will order this to be done as outpatient. Home Metoprolol switched to Coreg and this was up-titrated. Home Losartan-HCTZ discontinued and patient started on  Spironolactone 12.64m daily. Can consider adding Losartan at follow-up visit if BP and renal function allow. Discharge weight 216 lbs, down from 230 lbs on admission. Will need repeat BMET at follow-up.  CAD  High-sensitivity troponin only minimally elevated at 29. No repeat. [facility] as above. Known 100% stenosis of LAD and 80% stenosis of ramus prior to previous stent. Not felt to be cause of cardiomyopathy. Given solitary kidney and CKD decision made to treat medically. Continue aspirin, beta-blocker, and high-intensity statin.   Of note, patient previous only Lipitor 439mdaily but reportedly did not tolerate this well. He was restarted on this while here and has tolerated it well. He has agreed to retry this at discharge. Recommend repeat lipid panel/LFTs in 6 weeks.  Severe Mitral Regurgitation TEE showed LVEF of 20-25% with global hypokinesis with mild mitral prolapse of posterior leaflet and severe central MR. Felt to be in part secondary to cardiomyopathy but also due to myxomatous posterior mitral valve leaflet.  - Can follow as outpatient.  Frequent PVCs Frequent PVCs noted on telemetry.  - Continue Coreg 12.78m31mwice daily.  Acute on CKD Stage III Creatinine peaked at 1.62. Baseline around 1.3. Improved to 1.36 on day of discharge.  - Will need repeat BMET at follow-up visit.  Type 2 Diabetes Mellitus - New Diagnosis Hemoglobin A1c 7.9% this admission. Placed on sliding scale insulin during admission. Discussed with [facility] - will start Metformin 500m1mice daily at discharge. Can start this 48 hours after cardiac catheterization. Glucometer kit prescribed. Follow-up with PCP.  Fevers Patient has had daily fevers for about 4-6 weeks prior to admission as well as night sweats and weight loss. [facility]. ANA, ANCA, metanephrines, 5-HIAA, anti-DNA, anti mitochondrial Abs, Sjogrens,  RA, SPEP - all negative. HIV negative. [facility] and ID consulted while here to assist with work-up. Abdominal ultrasound and abdominal/pelvic CT showed no acute findings. TEE showed no evidence of endocarditis. Blood cultures negative to date. Urine culture with insignificant work-up. Patient has not had any fevers during admission. ID did not recommend antibiotics given he has been afebrile here. ID said he can follow-up with them if fevers return.  Iron Deficiency Anemia Patient had EGD in 4.2021 showed gastric ulcers and erosion with mild inflammation in the duodenal bulb. Hemoglobin stable at 9.3 at discharge.  - Continue home Protonix.  - [facility] started Ferous sulfate 3278mg71mly. Will continue at discharge.  Adrenal Adenoma Abdominal/Pelvic CT showed chronic left renal atrophy with non-obstructing left renal stone with stable subcentimeter left adrenal nodule felt to likely be an adenoma. Plasma metanephrine, DHEA, and aldosterone renin ordered. DHEA 16.5. Other results pending. Discussed with [facility] prior to discharge - can follow-up with PCP.   Bladder Stone CT scan showed 9mm b56mder that had moved from midline to region of the right UVJ. Dr. Belky'Tomma Rakers[facility] discussed with Urology and patient to follow-up with Urology in a couple of months.   Patient seen and examined by Dr. SkainsMarlou Porchetermined to be stable for discharge. Outpatient follow-up has been arranged. Medications as below.  Did the patient have an acute coronary syndrome (MI, NSTEMI, STEMI, etc) this admission?:  No.   The  elevated Troponin was due to the acute medical illness (demand ischemia).  _____________  Discharge Vitals Blood pressure (!) 118/50, pulse 86, temperature 98.3 F (36.8 C), temperature source Oral, resp. rate 18, height _0  (1.88 m), weight 98.2 kg, SpO2 96 %.  Filed Weights   11/06/19 1328 11/07/19 0512 11/08/19 0512  Weight: 103 kg 99.1 kg 98.2 kg    Labs & Radiologic Studies     CBC Recent Labs    11/07/19 0732 11/07/19 1715 11/07/19 1719 11/08/19 0356  WBC 11.3*  --   --  11.6*  HGB 9.2*   < > 10.2*  9.2* 9.3*  HCT 30.8*   < > 30.0*  27.0* 31.2*  MCV 81.3  --   --  80.0  PLT 319  --   --  311   < > = values in this interval not displayed.   Basic Metabolic Panel Recent Labs    11/07/19 0648 11/07/19 1715 11/07/19 1719 11/08/19 0356  NA 132*   < > 137  142 135  K 4.4   < > 4.6  4.0 4.2  CL 99  --   --  102  CO2 23  --   --  23  GLUCOSE 141*  --   --  120*  BUN 19  --   --  23  CREATININE 1.27*  --   --  1.36*  CALCIUM 8.8*  --   --  8.8*   < > = values in this interval not displayed.   Liver Function Tests No results for input(s): AST, ALT, ALKPHOS, BILITOT, PROT, ALBUMIN in the last 72 hours. No results for input(s): LIPASE, AMYLASE in the last 72 hours. High Sensitivity Troponin:   Recent Labs  Lab 11/03/19 1506  TROPONINIHS 29*    BNP Invalid input(s): POCBNP D-Dimer No results for input(s): DDIMER in the last 72 hours. Hemoglobin A1C No results for input(s): HGBA1C in the last 72 hours. Fasting Lipid Panel No results for input(s): CHOL, HDL, LDLCALC, TRIG, CHOLHDL, LDLDIRECT in the last 72 hours. Thyroid Function Tests No results for input(s): TSH, T4TOTAL, T3FREE, THYROIDAB in the last 72 hours.  Invalid input(s): FREET3 _____________  CT ABDOMEN PELVIS WO CONTRAST  Result Date: 11/05/2019 CLINICAL DATA:  Abdominal distention and fever. EXAM: CT ABDOMEN AND PELVIS WITHOUT CONTRAST TECHNIQUE: Multidetector CT imaging of the abdomen and pelvis was performed following the standard protocol without IV contrast. COMPARISON:  08/30/2019 FINDINGS: Lower chest: Small to moderate bilateral pleural effusions with associated bibasilar atelectasis which is new. Calcified plaque over the right coronary artery as well as the thoracic aorta. Hepatobiliary: Previous cholecystectomy. Liver and biliary tree are normal. Pancreas: Normal. Spleen:  Normal. Adrenals/Urinary Tract: Stable subcentimeter left adrenal nodule likely an adenoma. Right adrenal gland is normal. Stable left renal atrophy with small stone over the lower pole. Stable prominence of the left renal pelvis. Mild stable prominence of the right intrarenal collecting system. No evidence of right renal stones. Stable exophytic right renal cyst. Right ureter is normal. There is a 9 mm stone over the bladder which has moved from the midline to the region of the right UVJ. Stomach/Bowel: Stomach and small bowel are normal. Appendix is normal. Moderate diverticulosis of the colon most prominent over the descending and sigmoid colon. No active inflammation. Vascular/Lymphatic: Moderate calcified plaque of abdominal aorta which is normal in caliber. No adenopathy. Reproductive: Stable prominence of the prostate gland and seminal vesicles. Other: No significant free fluid.  No  free peritoneal air. Musculoskeletal: Degenerative change of the spine and left hip. Right hip prosthesis intact. IMPRESSION: 1.  No acute findings in the abdomen/pelvis. 2. New small to moderate bilateral pleural effusions with associated bibasilar atelectasis. 3. Chronic left renal atrophy with nonobstructing left renal stone. Mild stable prominence of the right intrarenal collecting system and stable right renal cyst. 9 mm bladder stone has moved from the midline to the region of the right UVJ. 4.  Moderate colonic diverticulosis without active inflammation. 5. Aortic Atherosclerosis (ICD10-I70.0). Atherosclerotic coronary artery disease. 6.  Stable prominence of the prostate gland and seminal vesicles. Electronically Signed   By: Marin Olp M.D.   On: 11/05/2019 17:14   DG Chest 1 View  Result Date: 11/05/2019 CLINICAL DATA:  Shortness of breath EXAM: CHEST  1 VIEW COMPARISON:  11/02/2019 FINDINGS: Cardiac shadow is stable. Vascular congestion is again identified similar to that seen on the prior exam. Some slight  increase in the degree of interstitial edema is seen. Small effusions are noted. No bony abnormality is seen. IMPRESSION: Slight increase in vascular congestion with increase in interstitial edema. Electronically Signed   By: Inez Catalina M.D.   On: 11/05/2019 03:45   DG Abd 1 View  Result Date: 11/05/2019 CLINICAL DATA:  Shortness of breath and abdominal pain EXAM: ABDOMEN - 1 VIEW COMPARISON:  11/02/2019 FINDINGS: Scattered large and small bowel gas is noted. No abnormal mass or abnormal calcifications are seen. Postsurgical changes are noted. No free air is seen. IMPRESSION: No acute abnormality noted. Electronically Signed   By: Inez Catalina M.D.   On: 11/05/2019 03:45   CARDIAC CATHETERIZATION  Result Date: 11/07/2019  Ost LAD lesion is 100% stenosed.  Ost LAD to Prox LAD lesion is 100% stenosed.  Previously placed Lat Ramus drug eluting stent is widely patent.  Balloon angioplasty was performed.  2nd RPL lesion is 90% stenosed.  1st RPL lesion is 40% stenosed.  1st Mrg lesion is 50% stenosed.  Ramus-1 lesion is 80% stenosed.  Previously placed Ramus-2 stent (unknown type) is widely patent.  Findings: Ao = 120/63 (88) LV =  118/13 RA = 1 RV = 28/3 PA = 28/10 (19) PCW = 12 Fick cardiac output/index = 8.5/3.8 PVR = 0.8 WU FA sat = 97% PA sat = 67%, 71% Assessment: 1. CAD with chronic total occlusion of LAD. 2. 80% lesion in ramus prior to previous stent (stent widely patent) 3. EF 20% with global HK 4. Well compensated hemodynamics Plan/Discussion: He has significant stenosis in ramus just prior to stent . Doubt this is causing cardiomyopathy. Given solitary kidney and CKD decision made to treat medically. D/w Dr. Martinique. Hemodynamics well compensated. Glori Bickers, MD 6:06 PM   US Abdomen Limited  Result Date: 11/04/2019 CLINICAL DATA:  Assess right upper quadrant and abdomen for ascites. History of prior cholecystectomy. EXAM: ULTRASOUND ABDOMEN LIMITED RIGHT UPPER QUADRANT COMPARISON:   None. FINDINGS: Gallbladder: The gallbladder is surgically absent. Common bile duct: Diameter: 2.3 mm Liver: No focal lesion identified. Within normal limits in parenchymal echogenicity. Portal vein is patent on color Doppler imaging with normal direction of blood flow towards the liver. Other: No intra-abdominal free fluid is seen. A right pleural effusion is noted. IMPRESSION: 1. Findings consistent with prior cholecystectomy. 2. Right pleural effusion. 3. No evidence of ascites. Electronically Signed   By: Virgina Norfolk M.D.   On: 11/04/2019 15:22   MR CARDIAC MORPHOLOGY W WO CONTRAST  Result Date: 11/08/2019 CLINICAL DATA:  79 year old male with cardiomyopathy, total LAD occlusion, and 80% stenosis of the ramus intermedius. Evaluate for viability. EXAM: CARDIAC MRI TECHNIQUE: The patient was scanned on a 1.5 Tesla GE magnet. A dedicated cardiac coil was used. Functional imaging was done using Fiesta sequences. 2,3, and 4 chamber views were done to assess for RWMA's. Modified Simpson's rule using a short axis stack was used to calculate an ejection fraction on a dedicated work Conservation officer, nature. The patient received 10 cc of Gadavist. After 10 minutes inversion recovery sequences were used to assess for infiltration and scar tissue. CONTRAST:  10 cc  of Gadavist FINDINGS: 1. Moderately dilated left ventricle with moderate concentric left ventricular hypertrophy, and severely decreased systolic function (LVEF = 27%). There is global hypokinesis and akinesis of the basal and mid inferoseptal, anteroseptal and anterior walls and apical septal and anterior walls. There is no late gadolinium enhancement in the left ventricular myocardium. LVEDD: 67 mm LVESD: 62 mm LVEDV: 340 ml LVESV: 249 ml SV: 91 ml CO: 6.7 L/min Myocardial mass: 246 g 2. Normal right ventricular size, thickness and systolic function (RVEF = 48%). There are no regional wall motion abnormalities. 3.  Moderately dilated left atrium,  normal right atrial size. 4. Normal size of the aortic root, ascending aorta and pulmonary artery. 5. Moderate mitral, mild aortic and trivial tricuspid regurgitation. 6.  Normal pericardium.  Mild circumferential pericardial effusion. 7.  Bilateral moderate pleural effusions. IMPRESSION: 1. Moderately dilated left ventricle with moderate concentric left ventricular hypertrophy, and severely decreased systolic function (LVEF = 27%). There is global hypokinesis and akinesis of the basal and mid inferoseptal, anteroseptal and anterior walls and apical septal and anterior walls. There is no late gadolinium enhancement in the left ventricular myocardium. ECV is elevated at 37%. Consider PYP scan for evaluation of TTR amyloidosis. 2. Normal right ventricular size, thickness and systolic function (RVEF = 48%). There are no regional wall motion abnormalities. 3. Moderately to severely dilated left atrium, normal right atrial size. 4. Normal size of the aortic root, ascending aorta and pulmonary artery. 5. Moderate mitral, mild aortic and trivial tricuspid regurgitation. 6. Normal pericardium.  Mild circumferential pericardial effusion. 7. Bilateral moderate pleural effusions. Electronically Signed   By: Ena Dawley   On: 11/08/2019 15:38   ECHOCARDIOGRAM COMPLETE  Result Date: 11/04/2019    ECHOCARDIOGRAM REPORT   Patient Name:   Christopher Nelson Date of Exam: 11/04/2019 Medical Rec #:  [id number]        Height:       74.0 in Accession #:    [id number]       Weight:       230.3 lb Date of Birth:  [birth year]       BSA:          2.308 m Patient Age:    64 years         BP:           138/95 mmHg Patient Gender: M                HR:           89 bpm. Exam Location:  Inpatient Procedure: 2D Echo, Cardiac Doppler and Color Doppler Indications:    Chest pain  History:        Patient has prior history of Echocardiogram examinations, most                 recent 03/08/2019. CHF, CAD, Mitral Valve Disease; Risk  Factors:Dyslipidemia. CKD.  Sonographer:    Clayton Lefort RDCS (AE) Referring Phys: Ellis Comments: Suboptimal subcostal window. IMPRESSIONS  1. Left ventricular ejection fraction, by estimation, is 25 to 30%. The left ventricle has severely decreased function. The left ventricle demonstrates regional wall motion abnormalities (see scoring diagram/findings for description). There is mild left  ventricular hypertrophy. Left ventricular diastolic parameters are indeterminate.  2. Right ventricular systolic function reduced. The right ventricular size is normal. Tricuspid regurgitation signal is inadequate for assessing PA pressure.  3. Left atrial size was severely dilated.  4. The mitral valve is abnormal, restricted posterior leaflet motion, mild annular calcification. Moderate to severe mitral valve regurgitation, eccentric and two jets noted.  5. The aortic valve is tricuspid. Aortic valve regurgitation is trivial. Mild to moderate aortic valve sclerosis/calcification is present, without any evidence of aortic stenosis.  6. Unable to estimate CVP. FINDINGS  Left Ventricle: Left ventricular ejection fraction, by estimation, is 25 to 30%. The left ventricle has severely decreased function. The left ventricle demonstrates regional wall motion abnormalities. The left ventricular internal cavity size was normal  in size. There is mild left ventricular hypertrophy. Left ventricular diastolic parameters are indeterminate.  LV Wall Scoring: The mid and distal anterior septum, mid inferolateral segment, mid inferoseptal segment, apical inferior segment, and apex are akinetic. The entire anterior wall, inferior wall, basal inferolateral segment, apical lateral segment, and mid anterolateral segment are hypokinetic. The basal anteroseptal segment, basal anterolateral segment, and basal inferoseptal segment are normal. Right Ventricle: The right ventricular size is normal. No increase in right  ventricular wall thickness. Right ventricular systolic function reduced. Tricuspid regurgitation signal is inadequate for assessing PA pressure. Left Atrium: Left atrial size was severely dilated. Right Atrium: Right atrial size was normal in size. Pericardium: There is no evidence of pericardial effusion. Mitral Valve: The mitral valve is abnormal. Mild mitral annular calcification. Moderate to severe mitral valve regurgitation. Tricuspid Valve: The tricuspid valve is grossly normal. Tricuspid valve regurgitation is trivial. Aortic Valve: The aortic valve is tricuspid. Aortic valve regurgitation is trivial. Mild to moderate aortic valve sclerosis/calcification is present, without any evidence of aortic stenosis. Mild aortic valve annular calcification. Aortic valve mean gradient measures 2.7 mmHg. Aortic valve peak gradient measures 4.3 mmHg. Aortic valve area, by VTI measures 3.57 cm. Pulmonic Valve: The pulmonic valve was grossly normal. Pulmonic valve regurgitation is trivial. Aorta: The aortic root is normal in size and structure. Venous: Unable to assess CVP. The inferior vena cava was not well visualized. IAS/Shunts: No atrial level shunt detected by color flow Doppler.  LEFT VENTRICLE PLAX 2D LVIDd:         5.10 cm LVIDs:         4.40 cm LV PW:         2.60 cm LV IVS:        1.70 cm LVOT diam:     2.50 cm LV SV:         62 LV SV Index:   27 LVOT Area:     4.91 cm  LV Volumes (MOD) LV vol d, MOD A2C: 267.0 ml LV vol d, MOD A4C: 190.0 ml LV vol s, MOD A2C: 169.0 ml LV vol s, MOD A4C: 119.0 ml LV SV MOD A2C:     98.0 ml LV SV MOD A4C:     190.0 ml LV SV MOD BP:      82.5 ml RIGHT VENTRICLE RV Basal diam:  3.80 cm  RV Mid diam:    3.70 cm RV S prime:     13.90 cm/s TAPSE (M-mode): 2.6 cm LEFT ATRIUM              Index       RIGHT ATRIUM           Index LA diam:        5.10 cm  2.21 cm/m  RA Area:     20.20 cm LA Vol (A2C):   130.0 ml 56.33 ml/m RA Volume:   71.90 ml  31.16 ml/m LA Vol (A4C):   114.0 ml  49.40 ml/m LA Biplane Vol: 123.0 ml 53.30 ml/m  AORTIC VALVE AV Area (Vmax):    3.23 cm AV Area (Vmean):   3.05 cm AV Area (VTI):     3.57 cm AV Vmax:           103.73 cm/s AV Vmean:          72.067 cm/s AV VTI:            0.173 m AV Peak Grad:      4.3 mmHg AV Mean Grad:      2.7 mmHg LVOT Vmax:         68.30 cm/s LVOT Vmean:        44.733 cm/s LVOT VTI:          0.126 m LVOT/AV VTI ratio: 0.73  AORTA Ao Asc diam: 3.60 cm MR Peak grad:    101.2 mmHg MR Mean grad:    57.0 mmHg   SHUNTS MR Vmax:         503.00 cm/s Systemic VTI:  0.13 m MR Vmean:        345.0 cm/s  Systemic Diam: 2.50 cm MR PISA:         4.02 cm MR PISA Eff ROA: 32 mm MR PISA Radius:  0.80 cm Christopher Lesches MD Electronically signed by Christopher Lesches MD Signature Date/Time: 11/04/2019/2:42:20 PM    Final    Disposition   Patient is being discharged home today in good condition.  Follow-up Plans & Appointments     Follow-up Information    Verta Ellen., NP Follow up.   Specialty: Cardiology Why: Hospital follow-up scheduld for 11/20/2019 at 9:30am with Katina Dung, one of Dr. Myles Gip NP. If this date/time does not work for you, please call our office to reschedule.  Contact information: [place] [address] [phone]        Christopher Percy, MD Follow up.   Specialty: Family Medicine Why: Please call your primary care provider's office to schedule follow-up visit within the next 1-2 weeks. Contact information: [place] [address] [phone]        Thayer Headings, MD Follow up.   Specialty: Infectious Diseases Why: Can follow-up with Infectious Disease as needed if you have recurrent fevers. Contact information: [address] [address] [phone]        [facility]. Schedule an appointment as soon as possible for a visit in 4 week(s).   Why: Call for an appointment for bladder stone. Contact information: [place] [phone]             Discharge Instructions    (HEART FAILURE PATIENTS) Call MD:  Anytime you have any of the following symptoms: 1) 3 pound weight gain in 24 hours or 5  pounds in 1 week 2) shortness of breath, with or without a dry hacking cough 3) swelling in the hands, feet or stomach 4) if you have to sleep on extra pillows at night in order to breathe.   Complete by: As directed    Diet - low sodium heart healthy   Complete by: As directed    Increase activity slowly   Complete by: As directed       Discharge Medications   Allergies as of 11/08/2019      Reactions   Cephalosporins Itching      Medication List    STOP taking these medications   amLODipine 5 MG tablet Commonly known as: NORVASC   losartan-hydrochlorothiazide 50-12.5 MG tablet Commonly known as: HYZAAR   metoprolol tartrate 50 MG tablet Commonly known as: LOPRESSOR     TAKE these medications   aspirin EC 81 MG tablet Take 81 mg by mouth daily.   atorvastatin 40 MG tablet Commonly known as: LIPITOR Take 40 mg by mouth daily.   b complex vitamins tablet Take 1 tablet by mouth daily.   blood glucose meter kit and supplies Kit Dispense based on patient and insurance preference. Use up to four times daily as directed. (FOR ICD-9 250.00, 250.01).   carvedilol 12.5 MG tablet Commonly known as: COREG Take 1 tablet (12.5 mg total) by mouth 2 (two) times daily with a meal.   Cinnamon 500 MG Tabs Take 2 tablets by mouth daily.   CoQ-10 200 MG Caps Take 200 mg by mouth daily.   Ester-C 500-60 MG Tabs Take by mouth.   ferrous sulfate 325 (65 FE) MG tablet Take 1 tablet (325 mg total) by mouth daily with breakfast. Start taking on: November 09, 2019   Glucosamine HCl 1000 MG Tabs Take 2 tablets by mouth daily.   Lysine 1000 MG Tabs Take 1,000 mg by mouth daily.   MAGNESIUM PO Take 250 mg by mouth daily.   metFORMIN 500 MG tablet Commonly known  as: Glucophage Take 1 tablet (500 mg total) by mouth 2 (two) times daily with a meal. Notes to patient: Start Saturday (11/10/2019) morning.   pantoprazole 40 MG tablet Commonly known as: Protonix Take 1 tablet (40 mg total) by mouth 2 (two) times daily.   simethicone 80 MG chewable tablet Commonly known as: MYLICON Chew 80 mg by mouth every 6 (six) hours as needed for flatulence.   spironolactone 25 MG tablet Commonly known as: ALDACTONE Take 0.5 tablets (12.5 mg total) by mouth daily. Start taking on: November 09, 2019   VITAMIN C PO Take 1,000 mg by mouth daily.   Vitamin D-3 25 MCG (1000 UT) Caps Take 1,000 Units by mouth daily.   ZANTAC PO Take 1 tablet by mouth at bedtime.   zinc gluconate 50 MG tablet Take 50 mg by mouth daily.          Outstanding Labs/Studies   Repeat BMET at follow-up.  Repeat lipid panel/LFTS in 6 weeks after restarting statin.  Duration of Discharge Encounter   Greater than 30 minutes including physician time.  Signed, Darreld Mclean, PA-C 11/08/2019, 7:09 PM  Personally seen and examined. Agree with above.   CAD -Cath report reviewed as above.  He does have stenosis prior to his previously placed ramus stent.  We will treat this medically given that his cardiomyopathy is out of proportion to this lesion.  Known LAD occlusion.  Euvolemic from right heart cath.  Cardiomyopathy/acute systolic heart failure -Euvolemic.  Cardiac MRI read discussed with Dr. Meda Coffee.  Nonischemic cardiomyopathy.  See suggested a PYP scan given his left ventricular hypertrophy and extracellular volume of 37% to evaluate for T TR amyloidosis.  We will set this up as outpatient at [facility] facility. --increased coreg to 12.5 BID - spironolactone 12.5 QD. Watch K. Creat has improved.  --As outpatient will likely restart ARB or entresto if BP and renal function reasonable.   Severe mitral regurgitation -In part from cardiomyopathy/also myxomatous posterior  mitral valve leaflet.  PVCs -Occasional on telemetry.  Carvedilol has been started.  Could consider 24-hour Holter monitor or 48-hour Holter monitor to precisely quantify the number of PVCs.  If PVC burden is greater than 15%, this could be a potential cause for cardiomyopathy  Fevers -No hospital fevers.  Do not have a clear etiology at this point.  No evidence of endocarditis on TEE.  Will set up close follow up and BMET with Dr. Domenic Polite or APP.   Candee Furbish, MD

## 2019-11-08 NOTE — Progress Notes (Signed)
Progress Note  Patient Name: Christopher Nelson Date of Encounter: 11/08/2019  [facility] Cardiologist: Rozann Lesches, MD   Subjective   Feels better but is worried that he will have continued symptoms.  Would love to understand why he feels the way he does.  Spoke to his wife again.  Inpatient Medications    Scheduled Meds:  vitamin C  1,000 mg Oral Daily   aspirin EC  81 mg Oral Daily   atorvastatin  40 mg Oral Daily   carvedilol  6.25 mg Oral BID WC   ferrous sulfate  325 mg Oral Q breakfast   heparin  5,000 Units Subcutaneous Q8H   heparin  5,000 Units Subcutaneous Q8H   insulin aspart  0-9 Units Subcutaneous TID WC   pantoprazole  40 mg Oral BID   polyethylene glycol  17 g Oral BID   senna-docusate  1 tablet Oral BID   sodium chloride flush  3 mL Intravenous Q12H   sodium chloride flush  3 mL Intravenous Q12H   sodium chloride flush  3 mL Intravenous Q12H   zolpidem  5 mg Oral QHS   Continuous Infusions:  sodium chloride     sodium chloride     PRN Meds: sodium chloride, sodium chloride, acetaminophen, acetaminophen, ALPRAZolam, magnesium hydroxide, ondansetron (ZOFRAN) IV, ondansetron (ZOFRAN) IV, sodium chloride flush, sodium chloride flush   Vital Signs    Vitals:   11/07/19 2145 11/07/19 2357 11/08/19 0002 11/08/19 0512  BP: 121/72 (!) 144/89 138/68 (!) 118/50  Pulse: 86     Resp:  18 20 18   Temp:  98 F (36.7 C) 98 F (36.7 C) 98.3 F (36.8 C)  TempSrc:  Oral Oral Oral  SpO2: 96%     Weight:    98.2 kg  Height:        Intake/Output Summary (Last 24 hours) at 11/08/2019 0928 Last data filed at 11/07/2019 2200 Gross per 24 hour  Intake 360 ml  Output 200 ml  Net 160 ml   Last 3 Weights 11/08/2019 11/07/2019 11/06/2019  Weight (lbs) 216 lb 9.6 oz 218 lb 6.4 oz 227 lb  Weight (kg) 98.249 kg 99.066 kg 102.967 kg      Telemetry    Sinus rhythm, PVCs noted.  Heart rate in the 80s.- Personally Reviewed  ECG    No new- Personally  Reviewed  Physical Exam   GEN: No acute distress.   Neck: No JVD Cardiac: RRR, 1/6 SM, no rubs, or gallops.  Respiratory: Clear to auscultation bilaterally. GI: Soft, nontender, non-distended  MS: No edema; No deformity. Neuro:  Nonfocal  Psych: Normal affect   Labs    High Sensitivity Troponin:   Recent Labs  Lab 11/03/19 1506  TROPONINIHS 29*      Chemistry Recent Labs  Lab 11/04/19 0317 11/04/19 0757 11/05/19 0251 11/05/19 0251 11/06/19 0333 11/06/19 0333 11/07/19 0648 11/07/19 0648 11/07/19 1715 11/07/19 1719 11/08/19 0356  NA   < >  --  136   < > 133*   < > 132*   < > 141 137   142 135  K   < >  --  5.1   < > 4.7   < > 4.4   < > 4.0 4.6   4.0 4.2  CL   < >  --  100   < > 97*  --  99  --   --   --  102  CO2   < >  --  27   < > 25  --  23  --   --   --  23  GLUCOSE   < >  --  166*   < > 159*  --  141*  --   --   --  120*  BUN   < >  --  28*   < > 25*  --  19  --   --   --  23  CREATININE   < >  --  1.62*   < > 1.33*  --  1.27*  --   --   --  1.36*  CALCIUM   < >  --  8.6*   < > 8.7*  --  8.8*  --   --   --  8.8*  PROT  --  7.1 7.2  --   --   --   --   --   --   --   --   ALBUMIN  --  3.0* 3.0*  --   --   --   --   --   --   --   --   AST  --  15 17  --   --   --   --   --   --   --   --   ALT  --  18 18  --   --   --   --   --   --   --   --   ALKPHOS  --  54 53  --   --   --   --   --   --   --   --   BILITOT  --  0.6 0.9  --   --   --   --   --   --   --   --   GFRNONAA   < >  --  40*   < > 51*  --  54*  --   --   --  49*  GFRAA   < >  --  46*   < > 59*  --  >60  --   --   --  57*  ANIONGAP   < >  --  9   < > 11  --  10  --   --   --  10   < > = values in this interval not displayed.     Hematology Recent Labs  Lab 11/06/19 1032 11/06/19 1032 11/07/19 0732 11/07/19 0732 11/07/19 1715 11/07/19 1719 11/08/19 0356  WBC 10.9*  --  11.3*  --   --   --  11.6*  RBC 3.94*  --  3.79*  --   --   --  3.90*  HGB 9.5*   < > 9.2*   < > 9.2* 10.2*   9.2* 9.3*   HCT 31.8*   < > 30.8*   < > 27.0* 30.0*   27.0* 31.2*  MCV 80.7  --  81.3  --   --   --  80.0  MCH 24.1*  --  24.3*  --   --   --  23.8*  MCHC 29.9*  --  29.9*  --   --   --  29.8*  RDW 15.2  --  15.2  --   --   --  15.4  PLT 335  --  319  --   --   --  311   < > =  values in this interval not displayed.    BNP Recent Labs  Lab 11/05/19 0251  BNP 861.0*     DDimer No results for input(s): DDIMER in the last 168 hours.   Radiology    CARDIAC CATHETERIZATION  Result Date: 11/07/2019  Ost LAD lesion is 100% stenosed.  Ost LAD to Prox LAD lesion is 100% stenosed.  Previously placed Lat Ramus drug eluting stent is widely patent.  Balloon angioplasty was performed.  2nd RPL lesion is 90% stenosed.  1st RPL lesion is 40% stenosed.  1st Mrg lesion is 50% stenosed.  Ramus-1 lesion is 80% stenosed.  Previously placed Ramus-2 stent (unknown type) is widely patent.  Findings: Ao = 120/63 (88) LV =  118/13 RA = 1 RV = 28/3 PA = 28/10 (19) PCW = 12 Fick cardiac output/index = 8.5/3.8 PVR = 0.8 WU FA sat = 97% PA sat = 67%, 71% Assessment: 1. CAD with chronic total occlusion of LAD. 2. 80% lesion in ramus prior to previous stent (stent widely patent) 3. EF 20% with global HK 4. Well compensated hemodynamics Plan/Discussion: He has significant stenosis in ramus just prior to stent . Doubt this is causing cardiomyopathy. Given solitary kidney and CKD decision made to treat medically. D/w Dr. Martinique. Hemodynamics well compensated. Glori Bickers, MD 6:06 PM    Cardiac Studies   Cath   Ost LAD lesion is 100% stenosed.  Ost LAD to Prox LAD lesion is 100% stenosed.  Previously placed Lat Ramus drug eluting stent is widely patent.  Balloon angioplasty was performed.  2nd RPL lesion is 90% stenosed.  1st RPL lesion is 40% stenosed.  1st Mrg lesion is 50% stenosed.  Ramus-1 lesion is 80% stenosed.  Previously placed Ramus-2 stent (unknown type) is widely patent.   Findings:  Ao  = 120/63 (88) LV =  118/13 RA = 1 RV = 28/3 PA = 28/10 (19) PCW = 12 Fick cardiac output/index = 8.5/3.8 PVR = 0.8 WU FA sat = 97% PA sat = 67%, 71%  Assessment: 1. CAD with chronic total occlusion of LAD.  2. 80% lesion in ramus prior to previous stent (stent widely patent) 3. EF 20% with global HK 4. Well compensated hemodynamics  Plan/Discussion:  He has significant stenosis in ramus just prior to stent . Doubt this is causing cardiomyopathy. Given solitary kidney and CKD decision made to treat medically. D/w Dr. Martinique.   Hemodynamics well compensated.  Diagnostic Dominance: Right    Glori Bickers, MD    Patient Profile     79 y.o. male with fever off-and-on over the past several weeks at home, negative rheumatologic work-up, CAD, new onset systolic heart failure  Assessment & Plan    CAD -Cath report reviewed as above.  He does have stenosis prior to his previously placed ramus stent.  We will treat this medically given that his cardiomyopathy is out of proportion to this lesion.  Known LAD occlusion.  Euvolemic from right heart cath.  Cardiomyopathy/acute systolic heart failure -Euvolemic.  Cardiac MRI read pending. --Will increase coreg to 12.5 BID --Will start spironolactone 12.5 QD. Watch K. Creat has improved.  --As outpatient will likely restart ARB or entresto if BP and renal function reasonable.   Severe mitral regurgitation -In part from cardiomyopathy/also myxomatous posterior mitral valve leaflet.  PVCs -Occasional on telemetry.  Carvedilol has been started.  Fevers -No hospital fevers.  Do not have a clear etiology at this point.  No evidence of endocarditis on TEE.  Will set up close follow up and BMET with Dr. Domenic Polite or APP.  After review of MRI, likely ok for DC.   For questions or updates, please contact [place] Please consult [email or web address] for contact info under        Signed, Candee Furbish, MD  11/08/2019, 9:28 AM

## 2019-11-08 NOTE — Progress Notes (Signed)
[facility]  PROGRESS NOTE  Christopher Nelson BSJ:[id number] DOB: Aug 18, 1940 DOA: 11/03/2019 PCP: Rory Percy, MD   Brief HPI:   79 year old male with history of hypertension, GERD, CAD, s/p CABG 2020 who was transferred from [facility] for work-up of chest pain.  Patient has been follow with [facility] hematology for chronic anemia, last seen on 6/24.  Over the past several weeks patient has been experiencing sweats, chills and fever after dinner associated with tightness and bloating.  Work-up including 5 HIAA, anticentromere antibody, ANCA anti-DNA, RF, ANA, cortisol, metanephrines, B12, SPEP, gastrin which were all found to be unremarkable.  Initial WBC count at [facility] was elevated at 15, hemoglobin was 9.5.  Chest x-ray showed central vascular congestion with mild interstitial opacities suggesting edema.  UA also was unremarkable    Subjective   Patient seen and examined, continues to be afebrile.  Work-up for adrenal adenoma currently underway.  Final results are pending.   Assessment/Plan:     1. Fever-patient reports fever off and on for several weeks.  He is currently afebrile.  He had no fever during this hospitalization.  HIV negative, LFTs normal, chest x-ray and UA at [facility] were negative.  Urine culture grew insignificant growth.  CT abdomen/pelvis negative for acute infectious process.  It showed 9 mm bladder stone moved from midline to the region of the right UVJ.  Blood cultures negative to date.  Final result is pending.  TEE is negative for endocarditis.  ID has been consulted. 2. New onset diabetes mellitus type 2-patient hemoglobin A1c is 7.9, will start Metformin 500 mg p.o. twice daily.  He can follow-up with PCP for further recommendations.  Will also give prescription for glucometer, to check blood glucose twice a day. 3. Adrenal adenoma-CT abdomen pelvis shows stable subcentimeter left adrenal nodule.  Plasma metanephrine,  aldosterone renin ordered.   Results are currently pending.  He can follow-up with PCP to get the results as outpatient.  DHEA mildly low at 16.5. 4. Hypertension-continue metoprolol.  Norvasc and Cozaar on hold. 5. Acute kidney injury on CKD stage IIIb-baseline creatinine 1.3.  Creatinine improved to 1.6.  And now back to baseline 1.27. 6. Diabetes mellitus type 2-continue sliding scale insulin with NovoLog. 7. Acute systolic heart failure-heart failure team consulted.  Plan per cardiology. 8. Bladder stone-CT scan shows 9 mm bladder stone moved from midline to region of right UVJ, Dr. Jerald Kief discussed with urology.  Patient to follow-up with urology in couple of months.    SpO2: 96 % O2 Flow Rate (L/min): 2 L/min   COVID-19 Labs  No results for input(s): DDIMER, FERRITIN, LDH, CRP in the last 72 hours.  Lab Results  Component Value Date   SARSCOV2NAA NEGATIVE 11/05/2019   [place] NEGATIVE 08/29/2019     CBG: Recent Labs  Lab 11/07/19 1218 11/07/19 1610 11/07/19 2057 11/08/19 0921 11/08/19 1148  GLUCAP 134* 136* 226* 127* 249*    CBC: Recent Labs  Lab 11/04/19 0317 11/04/19 0317 11/05/19 0251 11/05/19 0251 11/06/19 1032 11/07/19 0732 11/07/19 1715 11/07/19 1719 11/08/19 0356  WBC 11.6*  --  15.0*  --  10.9* 11.3*  --   --  11.6*  NEUTROABS 8.7*  --   --   --   --   --   --   --   --   HGB 8.5*   < > 9.7*   < > 9.5* 9.2* 9.2* 10.2*  9.2* 9.3*  HCT 29.4*   < > 33.0*   < >  31.8* 30.8* 27.0* 30.0*  27.0* 31.2*  MCV 82.1  --  82.1  --  80.7 81.3  --   --  80.0  PLT 253  --  328  --  335 319  --   --  311   < > = values in this interval not displayed.    Basic Metabolic Panel: Recent Labs  Lab 11/04/19 0317 11/04/19 0317 11/05/19 0251 11/05/19 0251 11/06/19 0333 11/07/19 0648 11/07/19 1715 11/07/19 1719 11/08/19 0356  NA 137   < > 136   < > 133* 132* 141 137  142 135  K 4.6   < > 5.1   < > 4.7 4.4 4.0 4.6  4.0 4.2  CL 102  --  100  --  97* 99  --   --  102  CO2 28  --  27   --  25 23  --   --  23  GLUCOSE 164*  --  166*  --  159* 141*  --   --  120*  BUN 22  --  28*  --  25* 19  --   --  23  CREATININE 1.41*  --  1.62*  --  1.33* 1.27*  --   --  1.36*  CALCIUM 8.4*  --  8.6*  --  8.7* 8.8*  --   --  8.8*   < > = values in this interval not displayed.     Liver Function Tests: Recent Labs  Lab 11/04/19 0757 11/05/19 0251  AST 15 17  ALT 18 18  ALKPHOS 54 53  BILITOT 0.6 0.9  PROT 7.1 7.2  ALBUMIN 3.0* 3.0*        DVT prophylaxis: Heparin  Code Status: Full code  Family Communication: Discussed with wife at bedside          Scheduled medications:  . vitamin C  1,000 mg Oral Daily  . aspirin EC  81 mg Oral Daily  . atorvastatin  40 mg Oral Daily  . carvedilol  12.5 mg Oral BID WC  . ferrous sulfate  325 mg Oral Q breakfast  . heparin  5,000 Units Subcutaneous Q8H  . insulin aspart  0-9 Units Subcutaneous TID WC  . pantoprazole  40 mg Oral BID  . polyethylene glycol  17 g Oral BID  . senna-docusate  1 tablet Oral BID  . sodium chloride flush  3 mL Intravenous Q12H  . sodium chloride flush  3 mL Intravenous Q12H  . sodium chloride flush  3 mL Intravenous Q12H  . spironolactone  12.5 mg Oral Daily  . zolpidem  5 mg Oral QHS    Antibiotics:   Anti-infectives (From admission, onward)   None       Objective   Vitals:   11/07/19 2145 11/07/19 2357 11/08/19 0002 11/08/19 0512  BP: 121/72 (!) 144/89 138/68 (!) 118/50  Pulse: 86     Resp:  18 20 18   Temp:  98 F (36.7 C) 98 F (36.7 C) 98.3 F (36.8 C)  TempSrc:  Oral Oral Oral  SpO2: 96%     Weight:    98.2 kg  Height:        Intake/Output Summary (Last 24 hours) at 11/08/2019 1627 Last data filed at 11/07/2019 2200 Gross per 24 hour  Intake 360 ml  Output 200 ml  Net 160 ml    07/06 1901 - 07/08 0700 In: 245 [P.O.:600; I.V.:3] Out: 200 [Urine:200]  [employer]  11/06/19 1328 11/07/19 0512 11/08/19 0512  Weight: 103 kg 99.1 kg 98.2 kg    Physical  Examination:    General-appears in no acute distress  Heart-S1-S2, regular, no murmur auscultated  Lungs-clear to auscultation bilaterally, no wheezing or crackles auscultated  Abdomen-soft, nontender, no organomegaly  Extremities-no edema in the lower extremities  Neuro-alert, oriented x3, no focal deficit noted    Data Reviewed:   Recent Results (from the past 240 hour(s))  Culture, blood (routine x 2)     Status: None (Preliminary result)   Collection Time: 11/05/19  8:37 AM   Specimen: BLOOD  Result Value Ref Range Status   Specimen Description BLOOD BLOOD LEFT ARM  Final   Special Requests   Final    BOTTLES DRAWN AEROBIC ONLY Blood Culture adequate volume   Culture   Final    NO GROWTH 3 DAYS Performed at [facility], [address], [place], [place] [address]    Report Status PENDING  Incomplete  Culture, blood (routine x 2)     Status: None (Preliminary result)   Collection Time: 11/05/19  8:37 AM   Specimen: BLOOD  Result Value Ref Range Status   Specimen Description BLOOD BLOOD LEFT ARM  Final   Special Requests   Final    BOTTLES DRAWN AEROBIC ONLY Blood Culture adequate volume   Culture   Final    NO GROWTH 3 DAYS Performed at [facility], [address], [place], [place] [address]    Report Status PENDING  Incomplete  Urine Culture     Status: Abnormal   Collection Time: 11/05/19  2:10 PM   Specimen: Urine, Random  Result Value Ref Range Status   Specimen Description URINE, RANDOM  Final   Special Requests NONE  Final   Culture (A)  Final    <10,000 COLONIES/mL INSIGNIFICANT GROWTH Performed at [facility], [place] [address], [place], [place] [address]    Report Status 11/06/2019 FINAL  Final  SARS Coronavirus 2 by RT PCR (hospital order, performed in [facility] hospital lab) Nasopharyngeal Nasopharyngeal Swab     Status: None   Collection Time: 11/05/19  6:26 PM   Specimen: Nasopharyngeal Swab  Result Value Ref Range Status    SARS Coronavirus 2 NEGATIVE NEGATIVE Final    Comment: (NOTE) SARS-CoV-2 target nucleic acids are NOT DETECTED.  The SARS-CoV-2 RNA is generally detectable in upper and lower respiratory specimens during the acute phase of infection. The lowest concentration of SARS-CoV-2 viral copies this assay can detect is 250 copies / mL. A negative result does not preclude SARS-CoV-2 infection and should not be used as the sole basis for treatment or other patient management decisions.  A negative result may occur with improper specimen collection / handling, submission of specimen other than nasopharyngeal swab, presence of viral mutation(s) within the areas targeted by this assay, and inadequate number of viral copies (<250 copies / mL). A negative result must be combined with clinical observations, patient history, and epidemiological information.  Fact Sheet for Patients:   StrictlyIdeas.no  Fact Sheet for Healthcare Providers: BankingDealers.co.za  This test is not yet approved or  cleared by the [place] FDA and has been authorized for detection and/or diagnosis of SARS-CoV-2 by FDA under an Emergency Use Authorization (EUA).  This EUA will remain in effect (meaning this test can be used) for the duration of the COVID-19 declaration under Section 564(b)(1) of the Act, 21 U.S.C. section 360bbb-3(b)(1), unless  the authorization is terminated or revoked sooner.  Performed at [facility], [place] [address], [place], [place] [address]     No results for input(s): LIPASE, AMYLASE in the last 168 hours. No results for input(s): AMMONIA in the last 168 hours.  Cardiac Enzymes: No results for input(s): CKTOTAL, CKMB, CKMBINDEX, TROPONINI in the last 168 hours. BNP (last 3 results) Recent Labs    11/05/19 0251  BNP 861.0*    ProBNP (last 3 results) No results for input(s): PROBNP in the last 8760 hours.  Studies:  CARDIAC  CATHETERIZATION  Result Date: 11/07/2019  Ost LAD lesion is 100% stenosed.  Ost LAD to Prox LAD lesion is 100% stenosed.  Previously placed Lat Ramus drug eluting stent is widely patent.  Balloon angioplasty was performed.  2nd RPL lesion is 90% stenosed.  1st RPL lesion is 40% stenosed.  1st Mrg lesion is 50% stenosed.  Ramus-1 lesion is 80% stenosed.  Previously placed Ramus-2 stent (unknown type) is widely patent.  Findings: Ao = 120/63 (88) LV =  118/13 RA = 1 RV = 28/3 PA = 28/10 (19) PCW = 12 Fick cardiac output/index = 8.5/3.8 PVR = 0.8 WU FA sat = 97% PA sat = 67%, 71% Assessment: 1. CAD with chronic total occlusion of LAD. 2. 80% lesion in ramus prior to previous stent (stent widely patent) 3. EF 20% with global HK 4. Well compensated hemodynamics Plan/Discussion: He has significant stenosis in ramus just prior to stent . Doubt this is causing cardiomyopathy. Given solitary kidney and CKD decision made to treat medically. D/w Dr. [place]. Hemodynamics well compensated. Glori Bickers, MD 6:06 PM   MR CARDIAC MORPHOLOGY W WO CONTRAST  Result Date: 11/08/2019 CLINICAL DATA:  79 year old male with cardiomyopathy, total LAD occlusion, and 80% stenosis of the ramus intermedius. Evaluate for viability. EXAM: CARDIAC MRI TECHNIQUE: The patient was scanned on a 1.5 Tesla GE magnet. A dedicated cardiac coil was used. Functional imaging was done using Fiesta sequences. 2,3, and 4 chamber views were done to assess for RWMA's. Modified Simpson's rule using a short axis stack was used to calculate an ejection fraction on a dedicated work Conservation officer, nature. The patient received 10 cc of Gadavist. After 10 minutes inversion recovery sequences were used to assess for infiltration and scar tissue. CONTRAST:  10 cc  of Gadavist FINDINGS: 1. Moderately dilated left ventricle with moderate concentric left ventricular hypertrophy, and severely decreased systolic function (LVEF = 27%). There is global  hypokinesis and akinesis of the basal and mid inferoseptal, anteroseptal and anterior walls and apical septal and anterior walls. There is no late gadolinium enhancement in the left ventricular myocardium. LVEDD: 67 mm LVESD: 62 mm LVEDV: 340 ml LVESV: 249 ml SV: 91 ml CO: 6.7 L/min Myocardial mass: 246 g 2. Normal right ventricular size, thickness and systolic function (RVEF = 48%). There are no regional wall motion abnormalities. 3.  Moderately dilated left atrium, normal right atrial size. 4. Normal size of the aortic root, ascending aorta and pulmonary artery. 5. Moderate mitral, mild aortic and trivial tricuspid regurgitation. 6.  Normal pericardium.  Mild circumferential pericardial effusion. 7.  Bilateral moderate pleural effusions. IMPRESSION: 1. Moderately dilated left ventricle with moderate concentric left ventricular hypertrophy, and severely decreased systolic function (LVEF = 27%). There is global hypokinesis and akinesis of the basal and mid inferoseptal, anteroseptal and anterior walls and apical septal and anterior walls. There is no late gadolinium enhancement in the left ventricular myocardium. ECV  is elevated at 37%. Consider PYP scan for evaluation of TTR amyloidosis. 2. Normal right ventricular size, thickness and systolic function (RVEF = 48%). There are no regional wall motion abnormalities. 3. Moderately to severely dilated left atrium, normal right atrial size. 4. Normal size of the aortic root, ascending aorta and pulmonary artery. 5. Moderate mitral, mild aortic and trivial tricuspid regurgitation. 6. Normal pericardium.  Mild circumferential pericardial effusion. 7. Bilateral moderate pleural effusions. Electronically Signed   By: Ena Dawley   On: 11/08/2019 15:38       Oswald Hillock   [facility] If 7PM-7AM, please contact night-coverage at [email or web address], Office  [phone]   11/08/2019, 4:27 PM  LOS: 5 days

## 2019-11-08 NOTE — Progress Notes (Signed)
No new issues and no fever during this hospitalization.  Looks likely to be discharged after the MRI.  If fevers returns, he can follow up with [place] as an outpatient.   Call with any questions.  Thayer Headings, MD

## 2019-11-08 NOTE — Plan of Care (Signed)
Pt d/c to home with self care, d/c education complete, printed and verbal instruction given to pt and spouse on diabetes self care, diet, glucose  monitor, Daily weights, and new medications, verbal understanding returned

## 2019-11-09 ENCOUNTER — Other Ambulatory Visit: Payer: Self-pay | Admitting: Student

## 2019-11-09 ENCOUNTER — Telehealth: Payer: Self-pay | Admitting: Student

## 2019-11-09 DIAGNOSIS — I5043 Acute on chronic combined systolic (congestive) and diastolic (congestive) heart failure: Secondary | ICD-10-CM

## 2019-11-09 LAB — ALDOSTERONE + RENIN ACTIVITY W/ RATIO
ALDO / PRA Ratio: 10.2 (ref 0.0–30.0)
Aldosterone: 2.3 ng/dL (ref 0.0–30.0)
PRA LC/MS/MS: 0.226 ng/mL/hr (ref 0.167–5.380)

## 2019-11-09 LAB — METANEPHRINES, PLASMA
Metanephrine, Free: 31.3 pg/mL (ref 0.0–88.0)
Normetanephrine, Free: 200 pg/mL — ABNORMAL HIGH (ref 0.0–191.8)

## 2019-11-09 NOTE — Progress Notes (Signed)
Ordered outpatient PYP scan to rule out amyloidosis. See Discharge Summary from 11/08/2019 for more information.

## 2019-11-09 NOTE — Telephone Encounter (Signed)
Left message for patient to call and schedule amyloid study ordered by Sande Rives, PA

## 2019-11-10 LAB — CULTURE, BLOOD (ROUTINE X 2)
Culture: NO GROWTH
Culture: NO GROWTH
Special Requests: ADEQUATE
Special Requests: ADEQUATE

## 2019-11-12 ENCOUNTER — Telehealth (HOSPITAL_COMMUNITY): Payer: Self-pay | Admitting: Student

## 2019-11-12 ENCOUNTER — Telehealth: Payer: Self-pay | Admitting: Cardiology

## 2019-11-12 NOTE — Telephone Encounter (Signed)
I callled patient to schedule Christopher Nelson per order from [facility].  Patient declined to schedule stating that he thought they decided that he did not need test.  He states that he sees Dr. Domenic Polite in [place] and I recommended him calling the office to see future recommendations.  Patient will follow up with me if need to schedule.

## 2019-11-12 NOTE — Telephone Encounter (Signed)
Received telephone call from patient in regards to a  call  from [facility], [place] in regards to schedule an amyloid study ordered by Sande Rives, [place]. Patient states that he wants to discuss with Dr. Domenic Polite first. He was advised to contact our office.

## 2019-11-12 NOTE — Telephone Encounter (Signed)
     Covering for Dr. Domenic Polite - It appears this was recommended based off his Cardiac MRI results from his recent admission. The patient already has follow-up scheduled with Katina Dung, NP on 11/20/2019 and testing can be further reviewed at that time. Dr. Domenic Polite is in the [place] office that day and Jonni Sanger can review it with Dr. Domenic Polite at that time as well.   Signed, Erma Heritage, PA-C 11/12/2019, 6:59 PM Pager: [phone]

## 2019-11-12 NOTE — Telephone Encounter (Signed)
Fwd to provider for review.

## 2019-11-13 NOTE — Telephone Encounter (Signed)
Patient notified and verbalized understanding. 

## 2019-11-14 DIAGNOSIS — K922 Gastrointestinal hemorrhage, unspecified: Secondary | ICD-10-CM | POA: Diagnosis not present

## 2019-11-14 DIAGNOSIS — Z7189 Other specified counseling: Secondary | ICD-10-CM | POA: Diagnosis not present

## 2019-11-14 DIAGNOSIS — Z789 Other specified health status: Secondary | ICD-10-CM | POA: Diagnosis not present

## 2019-11-19 DIAGNOSIS — D500 Iron deficiency anemia secondary to blood loss (chronic): Secondary | ICD-10-CM | POA: Diagnosis not present

## 2019-11-19 DIAGNOSIS — Z79899 Other long term (current) drug therapy: Secondary | ICD-10-CM | POA: Diagnosis not present

## 2019-11-20 ENCOUNTER — Ambulatory Visit: Payer: Medicare Other | Admitting: Family Medicine

## 2019-11-20 DIAGNOSIS — K219 Gastro-esophageal reflux disease without esophagitis: Secondary | ICD-10-CM | POA: Diagnosis not present

## 2019-11-20 DIAGNOSIS — I10 Essential (primary) hypertension: Secondary | ICD-10-CM | POA: Diagnosis not present

## 2019-11-20 DIAGNOSIS — Z9049 Acquired absence of other specified parts of digestive tract: Secondary | ICD-10-CM | POA: Diagnosis not present

## 2019-11-20 DIAGNOSIS — D500 Iron deficiency anemia secondary to blood loss (chronic): Secondary | ICD-10-CM | POA: Diagnosis not present

## 2019-11-20 DIAGNOSIS — R1084 Generalized abdominal pain: Secondary | ICD-10-CM | POA: Diagnosis not present

## 2019-11-20 DIAGNOSIS — R9389 Abnormal findings on diagnostic imaging of other specified body structures: Secondary | ICD-10-CM | POA: Diagnosis not present

## 2019-11-20 DIAGNOSIS — D649 Anemia, unspecified: Secondary | ICD-10-CM | POA: Diagnosis not present

## 2019-11-20 DIAGNOSIS — N261 Atrophy of kidney (terminal): Secondary | ICD-10-CM | POA: Diagnosis not present

## 2019-11-20 DIAGNOSIS — I700 Atherosclerosis of aorta: Secondary | ICD-10-CM | POA: Diagnosis not present

## 2019-11-21 NOTE — Progress Notes (Signed)
Cardiology Office Note  Date: 11/24/2019   ID: Christopher Nelson, Christopher Nelson [birth year], MRN [id number]  PCP:  Christopher Percy, MD  Cardiologist:  Christopher Lesches, MD Electrophysiologist:  None   Chief Complaint: Follow-up CAD, Cardiomyopathy,  hypertension, MR. Recent MRI for cardiac morphology. ECV elevated at 37 %. Consider PYP Scan for evaluation of TTR amyloidosis.  History of Present Illness: Christopher Nelson is a 79 y.o. male with a history of CAD status post DES to ramus 2018 with total occlusion of LAD at that time. Systolic CHF, severe MR, HTN, stage III CKD, GERD, gastric ulcer with recent GI bleed 08/21/2019, arthritis, fever, night sweats, weight loss presented to [facility] 10/08/2019 with shortness of breath and chest pain.  Transferred to [facility] for further work-up.  Transthoracic echocardiogram 11/04/2019 showed LVEF 25 to 30% with multiple WMA's, moderate to severe MR.  TEE LVEF of 20 to 25% with global hypokinesis mild mitral prolapse of posterior leaflet and central MR EF decreased from 55 to 60% on 03/23/2019.  He was diuresed with Lasix and responded well.  Dr. Haroldine Nelson advanced heart failure clinic consulted to assist with work-up.  Cardiac catheterization showed  100% stenosis of ostial/proximal LAD, stenosis of ramus prior to previous stent which was widely patent, 90% to second RPL, mild nonobstructive disease noted in first RPL and first marginal.  Hemodynamics well compensated.  It was felt that stenosis in the ramus was not the cause of cardiomyopathy.  Given solitary kidney and decreased renal function decision made to treat medically.  Cardiac MRI showed no late gadolinium enhancement in the LV myocardium, ECV was elevated at 37%. PYP scan was recommended for further evaluation for TTR amyloidosis.  Metoprolol switched to Coreg and uptitrated.  Home losartan/HCTZ discontinued and patient started on spironolactone 12.5 mg daily.  Could consider adding losartan to follow-up with  BP and renal function allow.  Discharge weight was 216 down from 230 pounds on admission.  At that time troponin was minimally elevated at 29.  No repeat.  Continue aspirin, beta-blocker and high intensity statin.  Previously only Lipitor 40 mg daily but reportedly did not tolerate.  He was restarted during hospital admission and tolerated well.  He agreed to continue at discharge.  Recommended repeat lipid panel and LFT in 6 weeks.  Also would need a BMP at follow-up.  Last saw Dr. Domenic Nelson on February 02, 2019.  He reported no progressive angina on current medical therapy.  His Plavix was stopped.  A follow-up echocardiogram was ordered to compare to previous study.  Patient had stopped Lipitor previously concerned about side effects.  He recommended patient get a follow-up lipid panel with PCP and would consider an alternative medication.  Patient's blood pressure was controlled.  At last visit 08/07/2019 stated he has been doing well from a cardiac standpoint.  He denied any recent progressive anginal or exertional symptoms.  Patient stated he recently had what he thought to be a respiratory infection with increased temperature up to 103 degrees.  He sought care at an urgent care facility in [facility].  He was prescribed amoxicillin.  Stated he had recently had some diarrhea-like stools and it was dark almost black and tarry like since visiting the urgent care clinic.  His CBC was unremarkable at urgent care facility. Denied any orthopnea PND, or lower extremity edema.   He presents today c/o of significant fatigue and exertional dyspnea. We discussed recent cardiac MR and Echo revealing significant decrease in EF,  hence the request for PYP scan to check for TTR amyloid. We discussed the findings of his recent cardiac catheterization. He has a CTO of LAD 100%. The cardiologist performing the catheterization recommended medical therapy only given the patient's solitary kidney and decreased renal  function. The patient states "I have two kidneys". Upon review of recent abdominal CT patient does have evidence of both kidneys. His Crt is 1.36. He states "the doctors aren't telling me anything about what's going on". He does have anemia. He states he has received one IV iron infusion and is pending another on Monday. He is frustrated over the way he feels and wants something done sooner than later. He states when he walked from the parking lot to the clinic he had to stop briefly due to exertional fatigue and dyspnea. He states this has been occurring more frequently and feels as though it is progressing. He state his abdomen feels distended and he has noticed more swelling in his lower extremities. I discussed with patient my intention to convey today's visit to Dr Christopher Nelson for further review.  Past Medical History:  Diagnosis Date  . Acute-on-chronic kidney injury ([place]) 11/08/2019  . Arthritis   . CKD (chronic kidney disease), stage III   . Coronary artery disease    a. unstable angina s/p cath 01/2017 -  specifically DES to a large branching ramus intermedius. Residual disease includes ostial occlusion of the LAD with right-to-left collaterals, also 90% posterolateral stenosis. EF 35-40% by cath but then 55-60% by echo.  . Essential hypertension   . GERD (gastroesophageal reflux disease)   . Gout   . History of kidney stones   . Ischemic cardiomyopathy    a. EF 35-40% by cath then 55-60% by echo shortly after in 01/2017.  . Mitral valve disease    a. 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  . Pre-diabetes   . Type 2 diabetes mellitus with complication, without long-term current use of insulin ([place]) 11/08/2019    Past Surgical History:  Procedure Laterality Date  . BIOPSY  08/31/2019   Procedure: BIOPSY;  Surgeon: Danie Binder, MD;  Location: [facility];  Service: Endoscopy;;  duodenum gastric  . CARDIAC CATHETERIZATION  01/06/2017    . cataract s Left 04/2017  . CHOLECYSTECTOMY    . COLONOSCOPY WITH ESOPHAGOGASTRODUODENOSCOPY (EGD)  06/2017   [facility]: erosive gastritis, evidence of prior gastric ulcers (scarring), no h.pylori. pancolonic diverticulosis (suspected source for his bleeding), internal hemorrhoids  . CORONARY STENT INTERVENTION  01/06/2017   CORONARY STENT INTERVENTION  . CORONARY STENT INTERVENTION N/A 01/06/2017   Procedure: CORONARY STENT INTERVENTION;  Surgeon: Troy Sine, MD;  Location: [place] CV LAB;  Service: Cardiovascular;  Laterality: N/A;  . ESOPHAGOGASTRODUODENOSCOPY N/A 08/31/2019   Procedure: ESOPHAGOGASTRODUODENOSCOPY (EGD);  Surgeon: Danie Binder, MD;  Location: [facility];  Service: Endoscopy;  Laterality: N/A;  . LEFT HEART CATH AND CORONARY ANGIOGRAPHY N/A 01/06/2017   Procedure: LEFT HEART CATH AND CORONARY ANGIOGRAPHY;  Surgeon: Troy Sine, MD;  Location: [place] CV LAB;  Service: Cardiovascular;  Laterality: N/A;  . LITHOTRIPSY    . RIGHT/LEFT HEART CATH AND CORONARY ANGIOGRAPHY N/A 11/07/2019   Procedure: RIGHT/LEFT HEART CATH AND CORONARY ANGIOGRAPHY;  Surgeon: Jolaine Artist, MD;  Location: [place] CV LAB;  Service: Cardiovascular;  Laterality: N/A;  . TEE WITHOUT CARDIOVERSION N/A 11/06/2019   Procedure: TRANSESOPHAGEAL ECHOCARDIOGRAM (TEE);  Surgeon: Christopher Nelson,  Shaune Pascal, MD;  Location: [facility] ENDOSCOPY;  Service: Cardiovascular;  Laterality: N/A;  . TONSILLECTOMY    . TOTAL HIP ARTHROPLASTY Right 05/15/2018   Procedure: TOTAL HIP ARTHROPLASTY ANTERIOR APPROACH;  Surgeon: Frederik Pear, MD;  Location: [facility] ORS;  Service: Orthopedics;  Laterality: Right;    Current Outpatient Medications  Medication Sig Dispense Refill  . atorvastatin (LIPITOR) 40 MG tablet Take 40 mg by mouth daily.     . blood glucose meter kit and supplies KIT Dispense based on patient and insurance preference. Use up to four times daily as directed. (FOR ICD-9 250.00,  250.01). 1 each 1  . carvedilol (COREG) 12.5 MG tablet Take 1 tablet (12.5 mg total) by mouth 2 (two) times daily with a meal. 60 tablet 2  . [employer] PURELAX 17 GM/SCOOP powder Take 17 g by mouth daily as needed.    . ferrous sulfate 325 (65 FE) MG tablet Take 1 tablet (325 mg total) by mouth daily with breakfast. 30 tablet 0  . LORazepam (ATIVAN) 1 MG tablet Take 1 tablet by mouth daily as needed.    Marland Kitchen MAGNESIUM PO Take 250 mg by mouth daily.     . metFORMIN (GLUCOPHAGE) 500 MG tablet Take 1 tablet (500 mg total) by mouth 2 (two) times daily with a meal. 60 tablet 11  . pantoprazole (PROTONIX) 40 MG tablet Take 1 tablet (40 mg total) by mouth 2 (two) times daily. 60 tablet 0  . raNITIdine HCl (ZANTAC PO) Take 1 tablet by mouth at bedtime.    . simethicone (MYLICON) 80 MG chewable tablet Chew 80 mg by mouth every 6 (six) hours as needed for flatulence.    Marland Kitchen spironolactone (ALDACTONE) 25 MG tablet Take 0.5 tablets (12.5 mg total) by mouth daily. 15 tablet 2  . traZODone (DESYREL) 50 MG tablet Take 1 tablet by mouth at bedtime as needed.    . furosemide (LASIX) 20 MG tablet Take 1 tablet (20 mg total) by mouth as needed for fluid (weight gain of 3 lbs in 24 hours or 5 lbs in one week). 30 tablet 2   No current facility-administered medications for this visit.   Allergies:  Cephalosporins   Social History: The patient  reports that he has never smoked. He has never used smokeless tobacco. He reports that he does not drink alcohol and does not use drugs.   Family History: The patient's family history includes Diabetes Mellitus II in his sister; Heart attack in his brother; Pancreatic cancer in his sister; Uterine cancer in his mother.   ROS:  Please see the history of present illness. Otherwise, complete review of systems is positive for none.  All other systems are reviewed and negative.   Physical Exam: VS:  BP (!) 134/72   Pulse 68   Ht '6\' 2"'  (1.88 m)   Wt (!) 235 lb (106.6 kg)   SpO2 98%    BMI 30.17 kg/m , BMI Body mass index is 30.17 kg/m.  Wt Readings from Last 3 Encounters:  11/22/19 (!) 235 lb (106.6 kg)  11/08/19 216 lb 9.6 oz (98.2 kg)  08/29/19 228 lb (103.4 kg)    General: Patient appears comfortable at rest. Neck: Supple, no elevated JVP or carotid bruits, no thyromegaly. Lungs: Clear to auscultation, nonlabored breathing at rest. Cardiac: Regular rate and rhythm, no S3 or significant systolic murmur, no pericardial rub. Extremities: mild pitting edema, distal pulses 2+. Skin: Warm and dry. Musculoskeletal: No kyphosis. Neuropsychiatric: Alert and oriented x3, affect grossly  appropriate.  ECG: None today  Recent Labwork: 11/04/2019: TSH 0.614 11/05/2019: ALT 18; AST 17; B Natriuretic Peptide 861.0 11/08/2019: BUN 23; Creatinine, Ser 1.36; Hemoglobin 9.3; Platelets 311; Potassium 4.2; Sodium 135  No results found for: CHOL, TRIG, HDL, CHOLHDL, VLDL, LDLCALC, LDLDIRECT  Other Studies Reviewed Today:  Cardiac catheterization 11/07/2019 Conclusion  Ost LAD lesion is 100% stenosed.  Ost LAD to Prox LAD lesion is 100% stenosed.  Previously placed Lat Ramus drug eluting stent is widely patent.  Balloon angioplasty was performed.  2nd RPL lesion is 90% stenosed.  1st RPL lesion is 40% stenosed.  1st Mrg lesion is 50% stenosed.  Ramus-1 lesion is 80% stenosed.  Previously placed Ramus-2 stent (unknown type) is widely patent.   1. CAD with chronic total occlusion of LAD.  2. 80% lesion in ramus prior to previous stent (stent widely patent) 3. EF 20% with global HK 4. Well compensated hemodynamics  He has significant stenosis in ramus just prior to stent . Doubt this is causing cardiomyopathy. Given solitary kidney and CKD decision made to treat medically. D/w Dr. Martinique.   Hemodynamics well compensated.   Diagnostic Dominance: Right   Echocardiogram 11/04/2019 1. Left ventricular ejection fraction, by estimation, is 25 to 30%. The left  ventricle has severely decreased function. The left ventricle demonstrates regional wall motion abnormalities (see scoring diagram/findings for description). There is mild left ventricular hypertrophy. Left ventricular diastolic parameters are indeterminate. 2. Right ventricular systolic function reduced. The right ventricular size is normal. Tricuspid regurgitation signal is inadequate for assessing PA pressure. 3. Left atrial size was severely dilated. 4. The mitral valve is abnormal, restricted posterior leaflet motion, mild annular calcification. Moderate to severe mitral valve regurgitation, eccentric and two jets noted. 5. The aortic valve is tricuspid. Aortic valve regurgitation is trivial. Mild to moderate aortic valve sclerosis/calcification is present, without any evidence of aortic stenosis. 6. Unable to estimate CVP.   MR CARDIAC MORPHOLOGY 11/08/2019 IMPRESSION: 1. Moderately dilated left ventricle with moderate concentric left ventricular hypertrophy, and severely decreased systolic function (LVEF = 27%). There is global hypokinesis and akinesis of the basal and mid inferoseptal, anteroseptal and anterior walls and apical septal and anterior walls. There is no late gadolinium enhancement in the left ventricular myocardium.  ECV is elevated at 37%. Consider PYP scan for evaluation of TTR amyloidosis.  2. Normal right ventricular size, thickness and systolic function (RVEF = 48%). There are no regional wall motion abnormalities.  3. Moderately to severely dilated left atrium, normal right atrial size.  4. Normal size of the aortic root, ascending aorta and pulmonary artery.  5. Moderate mitral, mild aortic and trivial tricuspid regurgitation.  6. Normal pericardium.  Mild circumferential pericardial effusion.  7. Bilateral moderate pleural effusions.   Echocardiogram 03/08/2019 1. Left ventricular ejection fraction, by visual estimation, is 55 to 60%. The  left ventricle has normal function. There is moderately increased left ventricular hypertrophy. 2. Elevated left atrial pressure. 3. Left ventricular diastolic parameters are consistent with Grade I diastolic dysfunction (impaired relaxation). 4. Global right ventricle has normal systolic function.The right ventricular size is normal. No increase in right ventricular wall thickness. 5. Left atrial size was moderately dilated. 6. Right atrial size was normal. 7. Mild aortic valve annular calcification. 8. The mitral valve is abnormal. Moderate mitral valve regurgitation. No evidence of mitral stenosis. 9. There is prolapse of the posterior mitral valve leaflet. The MR jet is eccentric and anterior, causing it to be difficult to evaluate and  possibly underestimated. Probable moderate mitral regurgitation. 10. The tricuspid valve is normal in structure. Tricuspid valve regurgitation is not demonstrated. 11. The aortic valve is tricuspid. Aortic valve regurgitation is mild. No evidence of aortic valve sclerosis or stenosis. 12. There is Mild calcification of the aortic valve. 13. There is Mild thickening of the aortic valve. 14. The pulmonic valve was not well visualized. Pulmonic valve regurgitation is mild. 15. Normal pulmonary artery systolic pressure. In comparison to the previous echocardiogram(s): Echocardiogram done 01/04/18 showed an EF of 65% with moderate MR.   Echocardiogram 01/04/2018: Study Conclusions  - Left ventricle: The cavity size was normal. Wall thickness was increased in a pattern of mild LVH. There was focal basal hypertrophy. Systolic function was normal. The estimated ejection fraction was in the range of 60% to 65%. Wall motion was normal; there were no regional wall motion abnormalities. Doppler parameters are consistent with abnormal left ventricular relaxation (grade 1 diastolic dysfunction). Doppler parameters are consistent with high  ventricular filling pressure. - Aortic valve: Mildly calcified annulus. Trileaflet; mildly thickened leaflets. There was mild regurgitation. Valve area (VTI): 3.37 cm^2. Valve area (Vmax): 2.99 cm^2. Valve area (Vmean): 2.93 cm^2. - Mitral valve: Mildly calcified annulus. Mildly thickened leaflets . There was moderate regurgitation. Valve area by continuity equation (using LVOT flow): 2.49 cm^2. - Left atrium: The atrium was moderately dilated. - Technically adequate study.  Assessment and Plan:  1. Coronary artery disease involving native coronary artery of native heart with unstable angina pectoris [facility]) Cardiac catheterization 11/07/2019: Ostial LAD lesion 100% (with r-l collateral), ostial LAD-pLAD 100% stenosed.  Previous placed lateral ramus DES widely patent, balloon angioplasty performed, second RPL lesion 90%, first RPL lesion 40%, first marginal lesion 50%, ramus 1 lesion 80%, ramus 2 stent widely patent.  EF 20% with global HK, well compensated hemodynamics.  Interventionalist mentioned significant stenosis in the ramus just prior to stent.  He doubted this was a cause of the cardiomyopathy.  He stated given solitary kidney and CKD the decision was made to treat medically.  Discussed with Dr. Martinique I discussed the cardiac catheterization with the patient.  Patient states he does have both kidneys.  Recent Abdominal CT shows evidence of both kidneys. However left kidney is atrophic. Small caliber left renal artery likely secondary to atherosclerotic changes/stenosis. This would account for atrophic left renal parenchyma. Continue Carvedilol 12.5 mg po bid. Not on ASA d/t GIB gastric ulcer/IDA.   2. Cardiomyopathy Evidence of significant decrease in ejection fraction from previous echo studies.  Patient had a recent cardiac MRI with evidence of EF 27% with elevated ECV 37%.  TTE Echocardiogram showed EF 25 to 30%. TEE echo 20-25% .A PYP scan was ordered to evaluate for ATTR  amyloidosis.  Patient initially refused the scan.  After discussion today he is willing to undergo the scan now that he understands the reason for the request. Message sent to Edmon Crape, who had recently called the patient to schedule the scan and asked her to re-schedule the scan and call the patient. May need to refer to EP for possible ICD if EF not improving.  3. Systolic HF (HFrEF) Clinical symptoms of HF including dyspnea, fatigue, abdominal distention, weight gain, LE edema. Advised patient to begin restricting sodium intake, restricting fluids to approx. 60 ounces per day. Advised to weigh himself every day and if weight increases by 3 pounds in 24 hours he is to take Lasix 20 mg or 5 lbs in 1 week he is to  take an extra dose of Lasix. Continue Carvedilol 12.5 mg po bid. Spironolactone 25 mg daily. May add ACEI or ARB at 1 week follow up.  4. Mitral valve disease  Recent echo 11/08/2019 Moderate to severe mitral valve regurgitation, eccentric and two jets noted. Patient informed regarding increase in  leaking valve my be contributing to increased dyspnea along with decreased pumping function and anemia.   4. Anemia Recent gastric ulcer with GIB 08/21/2019. Receiving iron infusions for anemia. Patient states he has received 1 iron infusion and has another scheduled for Monday. Recent Hgb 11.6 / Hct 31.2  5.Essential hypertension BP on arrival 134/72. Continue carvedilol 12.5 mg po bid.   Medication Adjustments/Labs and Tests Ordered: Current medicines are reviewed at length with the patient today.  Concerns regarding medicines are outlined above.   Disposition: Follow-up with Christopher Nelson or APP 1 week  Signed, Levell July, NP 11/24/2019 12:09 PM    [facility] [place], [place], [place] [address] Phone: [phone]; Fax: [phone]

## 2019-11-22 ENCOUNTER — Encounter: Payer: Self-pay | Admitting: Family Medicine

## 2019-11-22 ENCOUNTER — Ambulatory Visit (INDEPENDENT_AMBULATORY_CARE_PROVIDER_SITE_OTHER): Payer: Medicare Other | Admitting: Family Medicine

## 2019-11-22 VITALS — BP 134/72 | HR 68 | Ht 74.0 in | Wt 235.0 lb

## 2019-11-22 DIAGNOSIS — I2510 Atherosclerotic heart disease of native coronary artery without angina pectoris: Secondary | ICD-10-CM

## 2019-11-22 DIAGNOSIS — D500 Iron deficiency anemia secondary to blood loss (chronic): Secondary | ICD-10-CM | POA: Diagnosis not present

## 2019-11-22 DIAGNOSIS — I429 Cardiomyopathy, unspecified: Secondary | ICD-10-CM

## 2019-11-22 DIAGNOSIS — I10 Essential (primary) hypertension: Secondary | ICD-10-CM

## 2019-11-22 MED ORDER — FUROSEMIDE 20 MG PO TABS: 20.0000 mg | ORAL_TABLET | ORAL | 2 refills | Status: AC | PRN

## 2019-11-22 NOTE — Patient Instructions (Addendum)
Medication Instructions:   Begin Lasix 20mg  as needed for weight gain of 3 lbs / 24 hours or 5 lbs in one week.    Continue all other current medications.  Labwork:  BMET, Magnesium - orders given today.  Please do in 1 week.    Testing/Procedures:  PYP test - provider will follow   Follow-Up: 1 week   Any Other Special Instructions Will Be Listed Below (If Applicable). Weigh daily.  If you need a refill on your cardiac medications before your next appointment, please call your pharmacy.

## 2019-11-23 ENCOUNTER — Encounter: Payer: Self-pay | Admitting: Family Medicine

## 2019-11-26 ENCOUNTER — Other Ambulatory Visit: Payer: Self-pay | Admitting: *Deleted

## 2019-11-26 ENCOUNTER — Telehealth: Payer: Self-pay | Admitting: Cardiology

## 2019-11-26 DIAGNOSIS — I429 Cardiomyopathy, unspecified: Secondary | ICD-10-CM

## 2019-11-26 DIAGNOSIS — I5021 Acute systolic (congestive) heart failure: Secondary | ICD-10-CM

## 2019-11-26 DIAGNOSIS — D500 Iron deficiency anemia secondary to blood loss (chronic): Secondary | ICD-10-CM | POA: Diagnosis not present

## 2019-11-26 NOTE — Telephone Encounter (Signed)
Spoke with patient and advised him that he needed to call and reschedule his PYP testing.  Advised if his symptoms are severe that he needed to go back to the ED for an evaluation.  Verbalized understanding of plan.

## 2019-11-26 NOTE — Telephone Encounter (Signed)
Patient called stating that he continues to have shortness of breath. States that he is unable to sleep at night.States that he is very fatigue. Patient is wanting to know if he needs to have any additional testing.  [phone]

## 2019-11-27 ENCOUNTER — Telehealth (HOSPITAL_COMMUNITY): Payer: Self-pay | Admitting: *Deleted

## 2019-11-27 NOTE — Telephone Encounter (Signed)
Forward message to Matilde Haymaker, RN

## 2019-11-27 NOTE — Telephone Encounter (Signed)
Left message on voicemail per DPR in reference to upcoming appointment scheduled on 12/03/2019 at 1100 with detailed instructions given per Amyloid Study. LM to arrive 15 minutes early, and that it is imperative to arrive on time for appointment to keep from having the test rescheduled. If you need to cancel or reschedule your appointment, please call the office within 24 hours of your appointment. Failure to do so may result in a cancellation of your appointment, and a $50 no show fee. Phone number given for call back for any questions.  Jaimarie Rapozo, Ranae Palms

## 2019-11-27 NOTE — Telephone Encounter (Signed)
Christopher Nelson is returning Christopher Nelson's call.

## 2019-11-28 ENCOUNTER — Other Ambulatory Visit: Payer: Self-pay

## 2019-11-28 ENCOUNTER — Other Ambulatory Visit (HOSPITAL_COMMUNITY)
Admission: RE | Admit: 2019-11-28 | Discharge: 2019-11-28 | Disposition: A | Discharge status: Home / Self Care | Payer: Medicare Other | Source: Ambulatory Visit | Attending: Family Medicine | Admitting: Family Medicine

## 2019-11-28 DIAGNOSIS — R0602 Shortness of breath: Secondary | ICD-10-CM | POA: Diagnosis not present

## 2019-11-28 DIAGNOSIS — I10 Essential (primary) hypertension: Secondary | ICD-10-CM | POA: Insufficient documentation

## 2019-11-28 LAB — BASIC METABOLIC PANEL
Anion gap: 11 (ref 5–15)
BUN: 19 mg/dL (ref 8–23)
CO2: 22 mmol/L (ref 22–32)
Calcium: 9 mg/dL (ref 8.9–10.3)
Chloride: 104 mmol/L (ref 98–111)
Creatinine, Ser: 1.18 mg/dL (ref 0.61–1.24)
GFR calc Af Amer: >60 mL/min (ref >60)
GFR calc non Af Amer: 59 mL/min — ABNORMAL LOW (ref >60)
Glucose, Bld: 192 mg/dL — ABNORMAL HIGH (ref 70–99)
Potassium: 4 mmol/L (ref 3.5–5.1)
Sodium: 137 mmol/L (ref 135–145)

## 2019-11-28 LAB — MAGNESIUM: Magnesium: 1.8 mg/dL (ref 1.7–2.4)

## 2019-11-29 ENCOUNTER — Ambulatory Visit: Payer: Medicare Other | Admitting: Gastroenterology

## 2019-11-30 DIAGNOSIS — E7849 Other hyperlipidemia: Secondary | ICD-10-CM | POA: Diagnosis not present

## 2019-11-30 DIAGNOSIS — E1165 Type 2 diabetes mellitus with hyperglycemia: Secondary | ICD-10-CM | POA: Diagnosis not present

## 2019-11-30 DIAGNOSIS — I10 Essential (primary) hypertension: Secondary | ICD-10-CM | POA: Diagnosis not present

## 2019-12-03 ENCOUNTER — Ambulatory Visit (HOSPITAL_COMMUNITY): Payer: Medicare Other | Attending: Cardiology

## 2019-12-03 ENCOUNTER — Other Ambulatory Visit: Payer: Self-pay

## 2019-12-03 ENCOUNTER — Ambulatory Visit: Payer: Medicare Other | Admitting: Family Medicine

## 2019-12-03 DIAGNOSIS — I5043 Acute on chronic combined systolic (congestive) and diastolic (congestive) heart failure: Secondary | ICD-10-CM

## 2019-12-03 MED ORDER — TECHNETIUM TC 99M PYROPHOSPHATE
21.3000 | Freq: Once | INTRAVENOUS | Status: AC
  Administered 2019-12-03: 21.3 via INTRAVENOUS

## 2019-12-05 ENCOUNTER — Telehealth: Payer: Self-pay | Admitting: Cardiology

## 2019-12-05 DIAGNOSIS — R1084 Generalized abdominal pain: Secondary | ICD-10-CM | POA: Diagnosis not present

## 2019-12-05 DIAGNOSIS — Z7189 Other specified counseling: Secondary | ICD-10-CM | POA: Diagnosis not present

## 2019-12-05 DIAGNOSIS — K922 Gastrointestinal hemorrhage, unspecified: Secondary | ICD-10-CM | POA: Diagnosis not present

## 2019-12-05 DIAGNOSIS — I5043 Acute on chronic combined systolic (congestive) and diastolic (congestive) heart failure: Secondary | ICD-10-CM | POA: Diagnosis not present

## 2019-12-05 DIAGNOSIS — I340 Nonrheumatic mitral (valve) insufficiency: Secondary | ICD-10-CM | POA: Diagnosis not present

## 2019-12-05 DIAGNOSIS — D500 Iron deficiency anemia secondary to blood loss (chronic): Secondary | ICD-10-CM | POA: Diagnosis not present

## 2019-12-05 NOTE — Telephone Encounter (Signed)
Call returned to Dr. Federico Flake at the [facility].  He saw Mr. Steedman today for management of iron deficiency anemia with iron infusions.  He wanted to let me know that Mr. Azzara expressed frustration and unhappiness with his symptoms and cardiac diagnosis, also expressed that he was concerned that not everything was being done that could be done for his diagnosis.  I spoke with Dr. Federico Flake about the recent hospitalization and extensive cardiac testing that Mr. Wendorff has been involved with.  Also plan for him to continue to see Dr. Haroldine Laws as an outpatient in the heart failure clinic (he evaluated him in the hospital as well).  I spoke with nursing to make sure that a visit in the heart failure clinic was scheduled in the near future to try and piece everything together and make sure that Mr. Brakebill understands the plan for management.

## 2019-12-05 NOTE — Telephone Encounter (Signed)
Dr. Federico Flake, the patients Hematologist would like to speak w/ Dr. Domenic Polite   Please call [phone]

## 2019-12-06 ENCOUNTER — Telehealth: Payer: Self-pay | Admitting: *Deleted

## 2019-12-06 NOTE — Telephone Encounter (Signed)
-----   Message from Verta Ellen., NP sent at 11/28/2019  6:49 PM EDT ----- Electrolytes look good except for increased blood sugar. Tell him he needs to go to Primary MD and get better control of it. Renal function looks good.

## 2019-12-07 ENCOUNTER — Ambulatory Visit (INDEPENDENT_AMBULATORY_CARE_PROVIDER_SITE_OTHER): Payer: Medicare Other | Admitting: Family Medicine

## 2019-12-07 ENCOUNTER — Encounter: Payer: Self-pay | Admitting: Family Medicine

## 2019-12-07 VITALS — BP 112/78 | HR 82 | Ht 74.0 in | Wt 231.0 lb

## 2019-12-07 DIAGNOSIS — I429 Cardiomyopathy, unspecified: Secondary | ICD-10-CM | POA: Diagnosis not present

## 2019-12-07 DIAGNOSIS — I10 Essential (primary) hypertension: Secondary | ICD-10-CM | POA: Diagnosis not present

## 2019-12-07 DIAGNOSIS — I5022 Chronic systolic (congestive) heart failure: Secondary | ICD-10-CM | POA: Diagnosis not present

## 2019-12-07 DIAGNOSIS — I059 Rheumatic mitral valve disease, unspecified: Secondary | ICD-10-CM | POA: Diagnosis not present

## 2019-12-07 DIAGNOSIS — I2510 Atherosclerotic heart disease of native coronary artery without angina pectoris: Secondary | ICD-10-CM | POA: Diagnosis not present

## 2019-12-07 DIAGNOSIS — D500 Iron deficiency anemia secondary to blood loss (chronic): Secondary | ICD-10-CM

## 2019-12-07 NOTE — Progress Notes (Signed)
Cardiology Office Note  Date: 12/07/2019   ID: Christopher Nelson, Christopher Nelson [birth year], MRN [id number]  PCP:  Rory Percy, MD  Cardiologist:  Rozann Lesches, MD Electrophysiologist:  None   Chief Complaint: Follow-up CAD, Cardiomyopathy,  hypertension, MR. Recent MRI for cardiac morphology. ECV elevated at 37 %. Consider PYP Scan for evaluation of TTR amyloidosis.  History of Present Illness: Christopher Nelson is a 79 y.o. male with a history of CAD status post DES to ramus 2018 with total occlusion of LAD at that time. Systolic CHF, severe MR, HTN, stage III CKD, GERD, gastric ulcer with recent GI bleed 08/21/2019, arthritis, fever, night sweats, weight loss presented to [facility] 10/08/2019 with shortness of breath and chest pain.  Transferred to [facility] for further work-up.  Transthoracic echocardiogram 11/04/2019 showed LVEF 25 to 30% with multiple WMA's, moderate to severe MR.  TEE LVEF of 20 to 25% with global hypokinesis mild mitral prolapse of posterior leaflet and central MR EF decreased from 55 to 60% on 03/23/2019.  He was diuresed with Lasix and responded well.  Dr. Haroldine Laws advanced heart failure clinic consulted to assist with work-up.  Cardiac catheterization showed  100% stenosis of ostial/proximal LAD, stenosis of ramus prior to previous stent which was widely patent, 90% to second RPL, mild nonobstructive disease noted in first RPL and first marginal.  Hemodynamics well compensated.  It was felt that stenosis in the ramus was not the cause of cardiomyopathy.  Given solitary kidney and decreased renal function decision made to treat medically.  Cardiac MRI showed no late gadolinium enhancement in the LV myocardium, ECV was elevated at 37%. PYP scan was recommended for further evaluation for TTR amyloidosis.  Metoprolol switched to Coreg and uptitrated.  Home losartan/HCTZ discontinued and patient started on spironolactone 12.5 mg daily.  Could consider adding losartan to follow-up with BP  and renal function allow.  Discharge weight was 216 down from 230 pounds on admission.  At that time troponin was minimally elevated at 29.  No repeat.  Continue aspirin, beta-blocker and high intensity statin.  Previously only Lipitor 40 mg daily but reportedly did not tolerate.  He was restarted during hospital admission and tolerated well.  He agreed to continue at discharge.  Recommended repeat lipid panel and LFT in 6 weeks.  Also would need a BMP at follow-up.  Last saw Dr. Domenic Polite on February 02, 2019.  He reported no progressive angina on current medical therapy.  His Plavix was stopped.  A follow-up echocardiogram was ordered to compare to previous study.  Patient had stopped Lipitor previously concerned about side effects.  He recommended patient get a follow-up lipid panel with PCP and would consider an alternative medication.  Patient's blood pressure was controlled.  At last visit 08/07/2019 stated he has been doing well from a cardiac standpoint.  He denied any recent progressive anginal or exertional symptoms.  Patient stated he recently had what he thought to be a respiratory infection with increased temperature up to 103 degrees.  He sought care at an urgent care facility in [facility].  He was prescribed amoxicillin.  Stated he had recently had some diarrhea-like stools and it was dark almost black and tarry like since visiting the urgent care clinic.  His CBC was unremarkable at urgent care facility. Denied any orthopnea PND, or lower extremity edema.   At last visit  c/o of significant fatigue and exertional dyspnea. We discussed recent cardiac MR and Echo revealing significant decrease in  EF, hence the request for PYP scan to check for TTR amyloid. We discussed the findings of his recent cardiac catheterization. He has a CTO of LAD 100%. The cardiologist performing the catheterization recommended medical therapy only given the patient's solitary kidney and decreased renal function.  The patient states "I have two kidneys". Upon review of recent abdominal CT patient does have evidence of both kidneys. His Crt is 1.36. He stated "the doctors aren't telling me anything about what's going on".   At last visit he had received one IV iron infusion and was pending another the following Monday. He was and is frustrated over the way he feels and wanted something done sooner than later. He stated when he walked from the parking lot to the clinic he had to stop briefly due to exertional fatigue and dyspnea. He stated this has been occurring more frequently and feels as though it is progressing. He stated his abdomen felt distended and  noticed more swelling in his lower extremities. I discussed with patient my intention to convey today's visit to Dr Domenic Polite for further review.    Patient here for follow-up status post recent PYP scan for possible ATTR amyloidosis.  Per the report scan was equivocal for amyloidosis.  His ECV on MRI was 37%.  According to PYP scan criteria Definitive amyloidosis has been characterized as MRI finding of 43% with possible amyloid occurring at 34%.  PYP uptake is certainly not highly demonstrative of amyloidosis.  Deemed an equivocal study with HCL ratio 1.23.  Attempted to convey this information to patient in an understandable manner.  Patient is very stressed/frustrated and feels like nothing is being done and none of the medical providers are communicating the plan.  He states this has been going on for over 4 months without any progress towards resolution.  We have just received word that he has an appointment on August 11 at 10:40 AM at the heart failure clinic for follow-up.  He continues to complain of significant fatigue, shortness of breath on exertion.  He received his second iron infusion and states this has not improved his energy, fatigue, or stamina.  He continues to state he can only do minimal exertional activities before he has to stop and rest and  catch his breath.   Past Medical History:  Diagnosis Date  . Acute-on-chronic kidney injury ([place]) 11/08/2019  . Arthritis   . CKD (chronic kidney disease), stage III   . Coronary artery disease    a. unstable angina s/p cath 01/2017 -  specifically DES to a large branching ramus intermedius. Residual disease includes ostial occlusion of the LAD with right-to-left collaterals, also 90% posterolateral stenosis. EF 35-40% by cath but then 55-60% by echo.  . Essential hypertension   . GERD (gastroesophageal reflux disease)   . Gout   . History of kidney stones   . Ischemic cardiomyopathy    a. EF 35-40% by cath then 55-60% by echo shortly after in 01/2017.  . Mitral valve disease    a. 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  . Pre-diabetes   . Type 2 diabetes mellitus with complication, without long-term current use of insulin ([place]) 11/08/2019    Past Surgical History:  Procedure Laterality Date  . BIOPSY  08/31/2019   Procedure: BIOPSY;  Surgeon: Danie Binder, MD;  Location: [facility];  Service: Endoscopy;;  duodenum gastric  . CARDIAC CATHETERIZATION  01/06/2017  . cataract s Left  04/2017  . CHOLECYSTECTOMY    . COLONOSCOPY WITH ESOPHAGOGASTRODUODENOSCOPY (EGD)  06/2017   [facility]: erosive gastritis, evidence of prior gastric ulcers (scarring), no h.pylori. pancolonic diverticulosis (suspected source for his bleeding), internal hemorrhoids  . CORONARY STENT INTERVENTION  01/06/2017   CORONARY STENT INTERVENTION  . CORONARY STENT INTERVENTION N/A 01/06/2017   Procedure: CORONARY STENT INTERVENTION;  Surgeon: Troy Sine, MD;  Location: [place] CV LAB;  Service: Cardiovascular;  Laterality: N/A;  . ESOPHAGOGASTRODUODENOSCOPY N/A 08/31/2019   Procedure: ESOPHAGOGASTRODUODENOSCOPY (EGD);  Surgeon: Danie Binder, MD;  Location: [facility];  Service: Endoscopy;  Laterality: N/A;  . LEFT HEART CATH AND  CORONARY ANGIOGRAPHY N/A 01/06/2017   Procedure: LEFT HEART CATH AND CORONARY ANGIOGRAPHY;  Surgeon: Troy Sine, MD;  Location: [place] CV LAB;  Service: Cardiovascular;  Laterality: N/A;  . LITHOTRIPSY    . RIGHT/LEFT HEART CATH AND CORONARY ANGIOGRAPHY N/A 11/07/2019   Procedure: RIGHT/LEFT HEART CATH AND CORONARY ANGIOGRAPHY;  Surgeon: Jolaine Artist, MD;  Location: [place] CV LAB;  Service: Cardiovascular;  Laterality: N/A;  . TEE WITHOUT CARDIOVERSION N/A 11/06/2019   Procedure: TRANSESOPHAGEAL ECHOCARDIOGRAM (TEE);  Surgeon: Jolaine Artist, MD;  Location: [facility] ENDOSCOPY;  Service: Cardiovascular;  Laterality: N/A;  . TONSILLECTOMY    . TOTAL HIP ARTHROPLASTY Right 05/15/2018   Procedure: TOTAL HIP ARTHROPLASTY ANTERIOR APPROACH;  Surgeon: Frederik Pear, MD;  Location: [facility] ORS;  Service: Orthopedics;  Laterality: Right;    Current Outpatient Medications  Medication Sig Dispense Refill  . atorvastatin (LIPITOR) 40 MG tablet Take 40 mg by mouth daily.     . blood glucose meter kit and supplies KIT Dispense based on patient and insurance preference. Use up to four times daily as directed. (FOR ICD-9 250.00, 250.01). 1 each 1  . carvedilol (COREG) 12.5 MG tablet Take 1 tablet (12.5 mg total) by mouth 2 (two) times daily with a meal. 60 tablet 2  . [employer] PURELAX 17 GM/SCOOP powder Take 17 g by mouth daily as needed.    . ferrous sulfate 325 (65 FE) MG tablet Take 1 tablet (325 mg total) by mouth daily with breakfast. 30 tablet 0  . furosemide (LASIX) 20 MG tablet Take 1 tablet (20 mg total) by mouth as needed for fluid (weight gain of 3 lbs in 24 hours or 5 lbs in one week). 30 tablet 2  . LORazepam (ATIVAN) 1 MG tablet Take 1 tablet by mouth daily as needed.    Marland Kitchen MAGNESIUM PO Take 250 mg by mouth daily.     . metFORMIN (GLUCOPHAGE) 500 MG tablet Take 1 tablet (500 mg total) by mouth 2 (two) times daily with a meal. 60 tablet 11  . pantoprazole (PROTONIX) 40 MG tablet Take 1 tablet  (40 mg total) by mouth 2 (two) times daily. 60 tablet 0  . raNITIdine HCl (ZANTAC PO) Take 1 tablet by mouth at bedtime.    . simethicone (MYLICON) 80 MG chewable tablet Chew 80 mg by mouth every 6 (six) hours as needed for flatulence.    Marland Kitchen spironolactone (ALDACTONE) 25 MG tablet Take 0.5 tablets (12.5 mg total) by mouth daily. 15 tablet 2  . traZODone (DESYREL) 50 MG tablet Take 1 tablet by mouth at bedtime as needed.     No current facility-administered medications for this visit.   Allergies:  Cephalosporins   Social History: The patient  reports that he has never smoked. He has never used smokeless tobacco. He reports  that he does not drink alcohol and does not use drugs.   Family History: The patient's family history includes Diabetes Mellitus II in his sister; Heart attack in his brother; Pancreatic cancer in his sister; Uterine cancer in his mother.   ROS:  Please see the history of present illness. Otherwise, complete review of systems is positive for none.  All other systems are reviewed and negative.   Physical Exam: VS:  BP 112/78   Pulse 82   Ht '6\' 2"'  (1.88 m)   Wt 231 lb (104.8 kg)   SpO2 98%   BMI 29.66 kg/m , BMI Body mass index is 29.66 kg/m.  Wt Readings from Last 3 Encounters:  12/07/19 231 lb (104.8 kg)  11/22/19 (!) 235 lb (106.6 kg)  11/08/19 216 lb 9.6 oz (98.2 kg)    General: Patient appears comfortable at rest. Neck: Supple, no elevated JVP or carotid bruits, no thyromegaly. Lungs: Clear to auscultation, nonlabored breathing at rest. Cardiac: Regular rate and rhythm, no S3 or significant systolic murmur, no pericardial rub. Extremities: mild pitting edema, distal pulses 2+. Skin: Warm and dry. Musculoskeletal: No kyphosis. Neuropsychiatric: Alert and oriented x3, affect grossly appropriate.  ECG: None today  Recent Labwork: 11/04/2019: TSH 0.614 11/05/2019: ALT 18; AST 17; B Natriuretic Peptide 861.0 11/08/2019: Hemoglobin 9.3; Platelets 311 11/28/2019:  BUN 19; Creatinine, Ser 1.18; Magnesium 1.8; Potassium 4.0; Sodium 137  No results found for: CHOL, TRIG, HDL, CHOLHDL, VLDL, LDLCALC, LDLDIRECT  Other Studies Reviewed Today:   PYP Scan 12/03/2019 Study Highlights    Equivocal study with H/CL ratio 1.23  Grade 1 uptake  MRI study has been performed and demonstrated extracellular volume of 37%.   Definitive amyloidosis has been categorized as MRI finding of 43%, with possible amyloid occurring at 34%. PYP uptake is certainly not highly demonstrative of amyloidosis.     Cardiac catheterization 11/07/2019 Conclusion  Ost LAD lesion is 100% stenosed.  Ost LAD to Prox LAD lesion is 100% stenosed.  Previously placed Lat Ramus drug eluting stent is widely patent.  Balloon angioplasty was performed.  2nd RPL lesion is 90% stenosed.  1st RPL lesion is 40% stenosed.  1st Mrg lesion is 50% stenosed.  Ramus-1 lesion is 80% stenosed.  Previously placed Ramus-2 stent (unknown type) is widely patent.   1. CAD with chronic total occlusion of LAD.  2. 80% lesion in ramus prior to previous stent (stent widely patent) 3. EF 20% with global HK 4. Well compensated hemodynamics  He has significant stenosis in ramus just prior to stent . Doubt this is causing cardiomyopathy. Given solitary kidney and CKD decision made to treat medically. D/w Dr. Martinique.   Hemodynamics well compensated.   Diagnostic Dominance: Right   Echocardiogram 11/04/2019 1. Left ventricular ejection fraction, by estimation, is 25 to 30%. The left ventricle has severely decreased function. The left ventricle demonstrates regional wall motion abnormalities (see scoring diagram/findings for description). There is mild left ventricular hypertrophy. Left ventricular diastolic parameters are indeterminate. 2. Right ventricular systolic function reduced. The right ventricular size is normal. Tricuspid regurgitation signal is inadequate for assessing PA  pressure. 3. Left atrial size was severely dilated. 4. The mitral valve is abnormal, restricted posterior leaflet motion, mild annular calcification. Moderate to severe mitral valve regurgitation, eccentric and two jets noted. 5. The aortic valve is tricuspid. Aortic valve regurgitation is trivial. Mild to moderate aortic valve sclerosis/calcification is present, without any evidence of aortic stenosis. 6. Unable to estimate CVP.   MR  CARDIAC MORPHOLOGY 11/08/2019 IMPRESSION: 1. Moderately dilated left ventricle with moderate concentric left ventricular hypertrophy, and severely decreased systolic function (LVEF = 27%). There is global hypokinesis and akinesis of the basal and mid inferoseptal, anteroseptal and anterior walls and apical septal and anterior walls. There is no late gadolinium enhancement in the left ventricular myocardium.  ECV is elevated at 37%. Consider PYP scan for evaluation of TTR amyloidosis.  2. Normal right ventricular size, thickness and systolic function (RVEF = 48%). There are no regional wall motion abnormalities.  3. Moderately to severely dilated left atrium, normal right atrial size.  4. Normal size of the aortic root, ascending aorta and pulmonary artery.  5. Moderate mitral, mild aortic and trivial tricuspid regurgitation.  6. Normal pericardium.  Mild circumferential pericardial effusion.  7. Bilateral moderate pleural effusions.   Echocardiogram 03/08/2019 1. Left ventricular ejection fraction, by visual estimation, is 55 to 60%. The left ventricle has normal function. There is moderately increased left ventricular hypertrophy. 2. Elevated left atrial pressure. 3. Left ventricular diastolic parameters are consistent with Grade I diastolic dysfunction (impaired relaxation). 4. Global right ventricle has normal systolic function.The right ventricular size is normal. No increase in right ventricular wall thickness. 5. Left atrial size  was moderately dilated. 6. Right atrial size was normal. 7. Mild aortic valve annular calcification. 8. The mitral valve is abnormal. Moderate mitral valve regurgitation. No evidence of mitral stenosis. 9. There is prolapse of the posterior mitral valve leaflet. The MR jet is eccentric and anterior, causing it to be difficult to evaluate and possibly underestimated. Probable moderate mitral regurgitation. 10. The tricuspid valve is normal in structure. Tricuspid valve regurgitation is not demonstrated. 11. The aortic valve is tricuspid. Aortic valve regurgitation is mild. No evidence of aortic valve sclerosis or stenosis. 12. There is Mild calcification of the aortic valve. 13. There is Mild thickening of the aortic valve. 14. The pulmonic valve was not well visualized. Pulmonic valve regurgitation is mild. 15. Normal pulmonary artery systolic pressure. In comparison to the previous echocardiogram(s): Echocardiogram done 01/04/18 showed an EF of 65% with moderate MR.   Echocardiogram 01/04/2018: Study Conclusions  - Left ventricle: The cavity size was normal. Wall thickness was increased in a pattern of mild LVH. There was focal basal hypertrophy. Systolic function was normal. The estimated ejection fraction was in the range of 60% to 65%. Wall motion was normal; there were no regional wall motion abnormalities. Doppler parameters are consistent with abnormal left ventricular relaxation (grade 1 diastolic dysfunction). Doppler parameters are consistent with high ventricular filling pressure. - Aortic valve: Mildly calcified annulus. Trileaflet; mildly thickened leaflets. There was mild regurgitation. Valve area (VTI): 3.37 cm^2. Valve area (Vmax): 2.99 cm^2. Valve area (Vmean): 2.93 cm^2. - Mitral valve: Mildly calcified annulus. Mildly thickened leaflets . There was moderate regurgitation. Valve area by continuity equation (using LVOT flow): 2.49 cm^2. -  Left atrium: The atrium was moderately dilated. - Technically adequate study.  Assessment and Plan:  1. Coronary artery disease involving native coronary artery of native heart with unstable angina pectoris [facility]) Cardiac catheterization 11/07/2019: Ostial LAD lesion 100% (with r-l collateral), ostial LAD-pLAD 100% stenosed.  Previous placed lateral ramus DES widely patent, balloon angioplasty performed, second RPL lesion 90%, first RPL lesion 40%, first marginal lesion 50%, ramus 1 lesion 80%, ramus 2 stent widely patent.  EF 20% with global HK, well compensated hemodynamics.  Interventionalist mentioned significant stenosis in the ramus just prior to stent.  He doubted this was a  cause of the cardiomyopathy.  He stated given solitary kidney and CKD the decision was made to treat medically.  Discussed with Dr. Martinique I discussed the cardiac catheterization with the patient.  Patient states he does have both kidneys.  Recent Abdominal CT shows evidence of both kidneys. However left kidney is atrophic. Small caliber left renal artery likely secondary to atherosclerotic changes/stenosis. This would account for atrophic left renal parenchyma. Continue Carvedilol 12.5 mg po bid. Not on ASA d/t GIB gastric ulcer/IDA.   2. Cardiomyopathy  Evidence of significant decrease in ejection fraction from previous echo studies.  Patient had a recent cardiac MRI with evidence of EF 27% with elevated ECV 37%.  TTE Echocardiogram showed EF 25 to 30%. TEE echo 20-25% .A PYP scan was ordered to evaluate for ATTR amyloidosis.  Patient initially refused the scan.  After discussion today he was willing to undergo the scan once he understood the reason for the request. Message was sent to Edmon Crape, who had recently called the patient to schedule the scan and asked her to re-schedule the scan and call the patient. May need to refer to EP for possible ICD if EF not improving.  PYP scan was equivocal for amyloidosis per report.   Discussed this with the patient.  He has a follow-up on July 11 at 10:40 AM at the heart failure clinic.Continue carvedilol 12.5 mg po bid, Spironolactone 25 mg daily. Furosemide 20 mg prn.  3. Systolic HF (HFrEF) Clinical symptoms of HF including dyspnea, fatigue, abdominal distention, weight gain, LE edema. Advised patient to begin restricting sodium intake, restricting fluids to approx. 60 ounces per day. Advised to weigh himself every day and if weight increases by 3 pounds in 24 hours he is to take Lasix 20 mg or 5 lbs in 1 week he is to take an extra dose of Lasix. Continue Carvedilol 12.5 mg po bid. Spironolactone 25 mg daily. Furosemide 20 mg prn.  4. Mitral valve disease  Recent echo 11/08/2019 Moderate to severe mitral valve regurgitation, eccentric and two jets noted. Patient informed regarding increase in  leaking valve my be contributing to increased dyspnea along with decreased pumping function and anemia.   4. Anemia Recent gastric ulcer with GIB 08/21/2019. Receiving iron infusions for anemia.  Recent Hgb 11.6 / Hct 31.2  5.Essential hypertension He is normotensive today. Continue carvedilol 12.5 mg po bid.    Medication Adjustments/Labs and Tests Ordered: Current medicines are reviewed at length with the patient today.  Concerns regarding medicines are outlined above.   Disposition: Follow-up with Domenic Polite or APP 1 week  Signed, Levell July, NP 12/07/2019 2:57 PM    [facility] at [address], [place],  [address] Phone: [phone]; Fax: [phone]

## 2019-12-07 NOTE — Patient Instructions (Addendum)
Medication Instructions:   Your physician recommends that you continue on your current medications as directed. Please refer to the Current Medication list given to you today.  Labwork:  NONE  Testing/Procedures:  NONE  Follow-Up:  Your physician recommends that you schedule a follow-up appointment in: 3 months.  Any Other Special Instructions Will Be Listed Below (If Applicable).  If you need a refill on your cardiac medications before your next appointment, please call your pharmacy. 

## 2019-12-07 NOTE — Telephone Encounter (Signed)
Discussed during office visit today.

## 2019-12-08 DIAGNOSIS — I509 Heart failure, unspecified: Secondary | ICD-10-CM | POA: Diagnosis not present

## 2019-12-08 DIAGNOSIS — Z6829 Body mass index (BMI) 29.0-29.9, adult: Secondary | ICD-10-CM | POA: Diagnosis not present

## 2019-12-09 ENCOUNTER — Encounter: Payer: Self-pay | Admitting: Family Medicine

## 2019-12-11 NOTE — Progress Notes (Signed)
ADVANCED HF CLINIC NOTE  PCP: Primary Cardiologist: Domenic Polite   HPI:  Christopher Nelson is a 79 y.o. male with a history of CAD s/p DES to ramus in 2018 (occluded LAD at that time), acute on chronic systolic CHF, severe MR, hypertension, CKD stage III with solitary kidney , GERD, gastric ulcer with GI bleed in 08/2019, arthritis, and recent fevers/night sweats/weight loss who was transferred to [facility] from [facility] on 11/02/2019 for further w/u.   Echo 11/20 EF 55-60%. On admit to [facility]  11/04/2019 EF 25-30% with multiple wall motion abnormalities and moderate to severe MR. Underwent TEE due to concern for possible endocarditis with fevers and severe MR. TEE 7/21 EF 20-25% with global hypokinesis with mild mitral prolapse of posterior leaflet and severe central MR. No vegetations.  Had R/L Cath 7/21: known 100% stenosis of ostial/poximal LAD, 80% stenosis of ramus prior to previous stent which was widely patent, and 90% stenosis in the 2nd RPL. RHC well compensated. cMRI EF 27% RV normal. No LGE. ECV is elevated at 37%. PYP scan was recommended for further evaluation of TTR amyloidosis.  Ao = 120/63 (88) LV =  118/13 RA = 1 RV = 28/3 PA = 28/10 (19) PCW = 12 Fick cardiac output/index = 8.5/3.8 PVR = 0.8 WU FA sat = 97% PA sat = 67%, 71%   Had no fevers in hospital but had extensive w/u and ID consultation with no clear cause.  Returns today for post-hospital f/u. No further fevers but continues to feel worse. Very weak and SOB. SOB with any ADLs such as going to mailbox or putting socks on. No CP or edema. Takes and anxiety pill and sleeping pil to go to sleep. Wakes up SOB with PND. SBP at home running 120-140s  12/03/19 PYP H/CLL 1.23 (equivocal)    ROS: All systems negative except as listed in HPI, PMH and Problem List.  SH:  Social History   Socioeconomic History  . Marital status: Married    Spouse name: Not on file  . Number of children: Not on file  . Years of  education: Not on file  . Highest education level: Not on file  Occupational History  . Not on file  Tobacco Use  . Smoking status: Never Smoker  . Smokeless tobacco: Never Used  Vaping Use  . Vaping Use: Never used  Substance and Sexual Activity  . Alcohol use: No  . Drug use: No  . Sexual activity: Yes  Other Topics Concern  . Not on file  Social History Narrative  . Not on file   Social Determinants of Health   Financial Resource Strain:   . Difficulty of Paying Living Expenses:   Food Insecurity:   . Worried About Charity fundraiser in the Last Year:   . Arboriculturist in the Last Year:   Transportation Needs:   . Film/video editor (Medical):   Marland Kitchen Lack of Transportation (Non-Medical):   Physical Activity:   . Days of Exercise per Week:   . Minutes of Exercise per Session:   Stress:   . Feeling of Stress :   Social Connections:   . Frequency of Communication with Friends and Family:   . Frequency of Social Gatherings with Friends and Family:   . Attends Religious Services:   . Active Member of Clubs or Organizations:   . Attends Archivist Meetings:   Marland Kitchen Marital Status:   Intimate Partner Violence:   .  Fear of Current or Ex-Partner:   . Emotionally Abused:   Marland Kitchen Physically Abused:   . Sexually Abused:     FH:  Family History  Problem Relation Age of Onset  . Uterine cancer Mother   . Diabetes Mellitus II Sister   . Heart attack Brother        Age 52  . Pancreatic cancer Sister     Past Medical History:  Diagnosis Date  . Acute-on-chronic kidney injury ([place]) 11/08/2019  . Arthritis   . CKD (chronic kidney disease), stage III   . Coronary artery disease    a. unstable angina s/p cath 01/2017 -  specifically DES to a large branching ramus intermedius. Residual disease includes ostial occlusion of the LAD with right-to-left collaterals, also 90% posterolateral stenosis. EF 35-40% by cath but then 55-60% by echo.  . Essential hypertension   .  GERD (gastroesophageal reflux disease)   . Gout   . History of kidney stones   . Ischemic cardiomyopathy    a. EF 35-40% by cath then 55-60% by echo shortly after in 01/2017.  . Mitral valve disease    a. 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  . Pre-diabetes   . Type 2 diabetes mellitus with complication, without long-term current use of insulin ([place]) 11/08/2019    Current Outpatient Medications  Medication Sig Dispense Refill  . blood glucose meter kit and supplies KIT Dispense based on patient and insurance preference. Use up to four times daily as directed. (FOR ICD-9 250.00, 250.01). 1 each 1  . carvedilol (COREG) 12.5 MG tablet Take 1 tablet (12.5 mg total) by mouth 2 (two) times daily with a meal. 60 tablet 2  . [employer] PURELAX 17 GM/SCOOP powder Take 17 g by mouth daily as needed.    . ferrous sulfate 325 (65 FE) MG tablet Take 1 tablet (325 mg total) by mouth daily with breakfast. 30 tablet 0  . furosemide (LASIX) 20 MG tablet Take 1 tablet (20 mg total) by mouth as needed for fluid (weight gain of 3 lbs in 24 hours or 5 lbs in one week). 30 tablet 2  . LORazepam (ATIVAN) 1 MG tablet Take 1 tablet by mouth daily as needed.    Marland Kitchen MAGNESIUM PO Take 250 mg by mouth daily.     . metFORMIN (GLUCOPHAGE) 500 MG tablet Take 1 tablet (500 mg total) by mouth 2 (two) times daily with a meal. 60 tablet 11  . pantoprazole (PROTONIX) 40 MG tablet Take 1 tablet (40 mg total) by mouth 2 (two) times daily. 60 tablet 0  . raNITIdine HCl (ZANTAC PO) Take 1 tablet by mouth at bedtime.    . simethicone (MYLICON) 80 MG chewable tablet Chew 80 mg by mouth every 6 (six) hours as needed for flatulence.    Marland Kitchen spironolactone (ALDACTONE) 25 MG tablet Take 0.5 tablets (12.5 mg total) by mouth daily. 15 tablet 2  . sucralfate (CARAFATE) 1 g tablet Take by mouth.    . traZODone (DESYREL) 50 MG tablet Take 1 tablet by mouth at bedtime as needed.     No current  facility-administered medications for this encounter.    Vitals:   12/12/19 1046  BP: (!) 150/90  Pulse: 79  SpO2: 96%  Weight: 104.3 kg (230 lb)    PHYSICAL EXAM:  General:  Sitting up on table. Appearing SOB and taking frequent deep breaths HEENT: normal Neck: supple. JVP flat. Carotids 2+ bilaterally;  no bruits. No lymphadenopathy or thryomegaly appreciated. Cor: PMI normal. Regular rate & rhythm with frequent PVCs No rubs, gallops or murmurs. Lungs: clear Abdomen: soft, nontender, nondistended. No hepatosplenomegaly. No bruits or masses. Good bowel sounds. Extremities: no cyanosis, clubbing, rash, edema Neuro: alert & orientedx3, cranial nerves grossly intact. Moves all 4 extremities w/o difficulty. Affect pleasant.   ECG: NSR 93 with frequent PVCs IVCD (117m) Personally reviewed  REDS 33%    ASSESSMENT & PLAN:  1) Acute on Chronic Systolic CHF - Echo 117/79EF 55-60% - Echo  11/04/2019 EF 25-30% with multiple wall motion abnormalities and moderate to - - - TEE 7/21 EF 20-25% with global hypokinesis with mild mitral prolapse of posterior leaflet and severe central MR. - Cath 7/21: known 100% stenosis of ostial/poximal LAD, 80% stenosis of ramus prior to previous stent which was widely patent, and 90% stenosis in the 2nd RPL. RHC well compensated - cMRI EF 27% RV normal. No LGE. ECV is elevated at 37%.  - PYP H/CCL 1.23 doubt TTR amyloid - Etiology of CM remains unclear. Based on coronary anatomy I was surprised to no see any scar on cMRI. He does have frequent PVCs and I am suspicious for PVC cardiomyopath  - He continues to get worse - now NYHA IIIB-IV. He clearly has severe LV dysfunction but his symptoms seem out of proportion to [place] REDS reading and I wonder if other factors may be at play (? Viral pneumonitis - not apparent on CT and possibly anxiety/depression) - Continue carvedilol - Continue spiro - Add Entresto 24/26 bid - Place Zio patch to assess PVC burden.  If >=10% wil start amio to suppress - Will see back in 4 weeks. If not improved will proceed with CPX testing   2) CAD  -cath 7/21: Known 100% stenosis of LAD and 80% stenosis of ramus prior to previous stent. Not felt to be cause of cardiomyopathy. Given solitary kidney and CKD decision made to treat medically. - No s/s of ischemia - Continue aspirin, beta-blocker, and high-intensity statin.   3) Severe Mitral Regurgitation - TEE showed LVEF of 20-25% with global hypokinesis with mild mitral prolapse of posterior leaflet and severe central MR. Felt to be in part secondary to cardiomyopathy but also due to myxomatous posterior mitral valve leaflet. - continue medical therapy for now   4) Frequent PVCs - Frequent PVCs noted on telemetry.  - Continue Coreg 12.580mtwice daily. - Plan as above. [facility]io [facility]   5) CKD Stage IIIb - Baseline around 1.3-1.4 - Has solitary kidney with atrophic left kidney - Repeat labs today   6) Type 2 Diabetes Mellitus - New Diagnosis 7/21 - continue metformin - add SGLT2i soon   7) Fevers - extensive w/u and ID consultation with no clear cause. No fevers in hospital - resolved. ? Viral etiology  Total time spent 45 minutes. Over half that time spent discussing above.    DaGlori BickersMD  11:07 AM

## 2019-12-12 ENCOUNTER — Ambulatory Visit (HOSPITAL_COMMUNITY)
Admission: RE | Admit: 2019-12-12 | Discharge: 2019-12-12 | Disposition: A | Discharge status: Home / Self Care | Payer: Medicare Other | Source: Ambulatory Visit | Attending: Internal Medicine | Admitting: Internal Medicine

## 2019-12-12 ENCOUNTER — Encounter (HOSPITAL_COMMUNITY): Payer: Self-pay | Admitting: Internal Medicine

## 2019-12-12 ENCOUNTER — Other Ambulatory Visit: Payer: Self-pay

## 2019-12-12 ENCOUNTER — Ambulatory Visit (HOSPITAL_COMMUNITY)
Admission: RE | Admit: 2019-12-12 | Discharge: 2019-12-12 | Disposition: A | Discharge status: Home / Self Care | Payer: Medicare Other | Source: Ambulatory Visit

## 2019-12-12 VITALS — BP 150/90 | HR 79 | Wt 230.0 lb

## 2019-12-12 DIAGNOSIS — J9811 Atelectasis: Secondary | ICD-10-CM | POA: Diagnosis not present

## 2019-12-12 DIAGNOSIS — J90 Pleural effusion, not elsewhere classified: Secondary | ICD-10-CM | POA: Diagnosis not present

## 2019-12-12 DIAGNOSIS — R0600 Dyspnea, unspecified: Secondary | ICD-10-CM

## 2019-12-12 DIAGNOSIS — Z87442 Personal history of urinary calculi: Secondary | ICD-10-CM | POA: Diagnosis not present

## 2019-12-12 DIAGNOSIS — Z955 Presence of coronary angioplasty implant and graft: Secondary | ICD-10-CM | POA: Insufficient documentation

## 2019-12-12 DIAGNOSIS — I255 Ischemic cardiomyopathy: Secondary | ICD-10-CM | POA: Insufficient documentation

## 2019-12-12 DIAGNOSIS — R509 Fever, unspecified: Secondary | ICD-10-CM | POA: Diagnosis not present

## 2019-12-12 DIAGNOSIS — Z8249 Family history of ischemic heart disease and other diseases of the circulatory system: Secondary | ICD-10-CM | POA: Insufficient documentation

## 2019-12-12 DIAGNOSIS — I130 Hypertensive heart and chronic kidney disease with heart failure and stage 1 through stage 4 chronic kidney disease, or unspecified chronic kidney disease: Secondary | ICD-10-CM | POA: Diagnosis not present

## 2019-12-12 DIAGNOSIS — N1832 Chronic kidney disease, stage 3b: Secondary | ICD-10-CM | POA: Insufficient documentation

## 2019-12-12 DIAGNOSIS — Z79899 Other long term (current) drug therapy: Secondary | ICD-10-CM | POA: Insufficient documentation

## 2019-12-12 DIAGNOSIS — I493 Ventricular premature depolarization: Secondary | ICD-10-CM

## 2019-12-12 DIAGNOSIS — I2510 Atherosclerotic heart disease of native coronary artery without angina pectoris: Secondary | ICD-10-CM | POA: Diagnosis not present

## 2019-12-12 DIAGNOSIS — I517 Cardiomegaly: Secondary | ICD-10-CM | POA: Diagnosis not present

## 2019-12-12 DIAGNOSIS — I5022 Chronic systolic (congestive) heart failure: Secondary | ICD-10-CM

## 2019-12-12 DIAGNOSIS — Z7984 Long term (current) use of oral hypoglycemic drugs: Secondary | ICD-10-CM | POA: Insufficient documentation

## 2019-12-12 DIAGNOSIS — K219 Gastro-esophageal reflux disease without esophagitis: Secondary | ICD-10-CM | POA: Diagnosis not present

## 2019-12-12 DIAGNOSIS — Z8711 Personal history of peptic ulcer disease: Secondary | ICD-10-CM | POA: Insufficient documentation

## 2019-12-12 DIAGNOSIS — E1122 Type 2 diabetes mellitus with diabetic chronic kidney disease: Secondary | ICD-10-CM | POA: Insufficient documentation

## 2019-12-12 DIAGNOSIS — F419 Anxiety disorder, unspecified: Secondary | ICD-10-CM | POA: Insufficient documentation

## 2019-12-12 DIAGNOSIS — M1990 Unspecified osteoarthritis, unspecified site: Secondary | ICD-10-CM | POA: Diagnosis not present

## 2019-12-12 DIAGNOSIS — M109 Gout, unspecified: Secondary | ICD-10-CM | POA: Insufficient documentation

## 2019-12-12 DIAGNOSIS — Z833 Family history of diabetes mellitus: Secondary | ICD-10-CM | POA: Diagnosis not present

## 2019-12-12 DIAGNOSIS — I340 Nonrheumatic mitral (valve) insufficiency: Secondary | ICD-10-CM | POA: Diagnosis not present

## 2019-12-12 DIAGNOSIS — I059 Rheumatic mitral valve disease, unspecified: Secondary | ICD-10-CM

## 2019-12-12 DIAGNOSIS — I5021 Acute systolic (congestive) heart failure: Secondary | ICD-10-CM | POA: Diagnosis not present

## 2019-12-12 LAB — COMPREHENSIVE METABOLIC PANEL
ALT: 16 U/L (ref 0–44)
AST: 12 U/L — ABNORMAL LOW (ref 15–41)
Albumin: 3.4 g/dL — ABNORMAL LOW (ref 3.5–5.0)
Alkaline Phosphatase: 51 U/L (ref 38–126)
Anion gap: 9 (ref 5–15)
BUN: 20 mg/dL (ref 8–23)
CO2: 24 mmol/L (ref 22–32)
Calcium: 9.1 mg/dL (ref 8.9–10.3)
Chloride: 103 mmol/L (ref 98–111)
Creatinine, Ser: 1.4 mg/dL — ABNORMAL HIGH (ref 0.61–1.24)
GFR calc Af Amer: 55 mL/min — ABNORMAL LOW (ref >60)
GFR calc non Af Amer: 48 mL/min — ABNORMAL LOW (ref >60)
Glucose, Bld: 165 mg/dL — ABNORMAL HIGH (ref 70–99)
Potassium: 4.1 mmol/L (ref 3.5–5.1)
Sodium: 136 mmol/L (ref 135–145)
Total Bilirubin: 0.7 mg/dL (ref 0.3–1.2)
Total Protein: 7 g/dL (ref 6.5–8.1)

## 2019-12-12 LAB — CBC
HCT: 39.2 % (ref 39.0–52.0)
Hemoglobin: 11.5 g/dL — ABNORMAL LOW (ref 13.0–17.0)
MCH: 24.8 pg — ABNORMAL LOW (ref 26.0–34.0)
MCHC: 29.3 g/dL — ABNORMAL LOW (ref 30.0–36.0)
MCV: 84.5 fL (ref 80.0–100.0)
Platelets: 186 10*3/uL (ref 150–400)
RBC: 4.64 MIL/uL (ref 4.22–5.81)
RDW: 20.8 % — ABNORMAL HIGH (ref 11.5–15.5)
WBC: 7.2 10*3/uL (ref 4.0–10.5)
nRBC: 0 % (ref 0.0–0.2)

## 2019-12-12 LAB — BRAIN NATRIURETIC PEPTIDE: B Natriuretic Peptide: 1110.9 pg/mL — ABNORMAL HIGH (ref 0.0–100.0)

## 2019-12-12 LAB — TSH: TSH: 1.151 u[IU]/mL (ref 0.350–4.500)

## 2019-12-12 MED ORDER — ENTRESTO 24-26 MG PO TABS
1.0000 | ORAL_TABLET | Freq: Two times a day (BID) | ORAL | 3 refills | Status: AC
Start: 2019-12-12 — End: ?

## 2019-12-12 NOTE — Patient Instructions (Signed)
Start Entresto 24/26 mg Twice daily   Labs done today, we will call you for abnormal results  Chest X-ray needed today  Your provider has recommended that  you wear a Zio Patch for 7 days.  This monitor will record your heart rhythm for our review.  IF you have any symptoms while wearing the monitor please press the button.  If you have any issues with the patch or you notice a red or orange light on it please call the company at [phone].  Once you remove the patch please mail it back to the company as soon as possible so we can get the results.  Your physician recommends that you schedule a follow-up appointment in: 4-6 weeks  If you have any questions or concerns before your next appointment please send [place] a message through [place] or call our office at [phone].    TO LEAVE A MESSAGE FOR THE NURSE SELECT OPTION 2, PLEASE LEAVE A MESSAGE INCLUDING: . YOUR NAME . DATE OF BIRTH . CALL BACK NUMBER . REASON FOR CALL**this is important as we prioritize the call backs  [place] AS LONG AS YOU CALL BEFORE 4:00 PM  At the [facility], you and your health needs are our priority. As part of our continuing mission to provide you with exceptional heart care, we have created designated Provider Care Teams. These Care Teams include your primary Cardiologist (physician) and Advanced Practice Providers (APPs- Physician Assistants and Nurse Practitioners) who all work together to provide you with the care you need, when you need it.   You may see any of the following providers on your designated Care Team at your next follow up: Marland Kitchen Dr Glori Bickers . Dr Loralie Champagne . Darrick Grinder, NP . Lyda Jester, PA . Audry Riles, PharmD   Please be sure to bring in all your medications bottles to every appointment.

## 2019-12-12 NOTE — Progress Notes (Signed)
ReDS Vest / Clip - 12/12/19 1100      ReDS Vest / Clip   Station Marker C    Ruler Value 31    ReDS Value Range Low volume    ReDS Actual Value 33

## 2019-12-12 NOTE — Progress Notes (Signed)
Zio patch placed onto patient.  All instructions and information reviewed with patient, they verbalize understanding with no questions. 

## 2019-12-17 ENCOUNTER — Telehealth: Payer: Self-pay | Admitting: Family Medicine

## 2019-12-17 ENCOUNTER — Telehealth (HOSPITAL_COMMUNITY): Payer: Self-pay

## 2019-12-17 NOTE — Telephone Encounter (Signed)
Christopher Nelson called stating that he has been experiencing sob at night,dizziness upon standing,some right foot swelling,headaches for the last 4-5 days. He denies Chest pain,arm pain,weight gain.  Current BP:191/96 HR:103 SpO2:96% (no am meds)  He states that he has also stopped taking his Lorazepam 1mg  4-5 days ago due to him stating that he thinks he wasn't helping him sleep anymore. He also states that he did take lasix 20mg  1 tab Saturday night(8/14) but didn't get any rest due to going to the bathroom several times.  Patient does not want to go to the ER and would like to know the results of his recent lab and Cardiac MRI results. He also states he doesn't feel like anyone is taking his condition seriously and doesn't know how much more he can take.    He also is currently wearing a zio monitor that he is suppose to take off tomorrow.  Saw DB 12/12/19 Started on Entresto 24-26mg  1 tab bid RedsClip reading was 33%    Please advise.

## 2019-12-17 NOTE — Telephone Encounter (Signed)
He apparently called another provider later on this morning.  See below  He called this nurse practitioner below.  Just call him and tell him to follow her instructions.  Please call. Stop entresto. BP high. Needs BMET. Please ask if he has one set up. If not we need to get BMET.     Please advise to take all other medications.    Set up for CPX test to help [place] determine if shortness of breath/fatigue related heart, lungs, or deconditioning.    Amy Clegg NP-C  10:45 AM

## 2019-12-17 NOTE — Telephone Encounter (Signed)
Patient called stating that he is very fatigue, can't sleep states that he is worn out when he goes to check the mail. States that when he tries to go to sleep his heart immediately wakes him back up.  Patient is requesting to speak with [facility] asap.

## 2019-12-17 NOTE — Telephone Encounter (Signed)
Patient advised and verbalized understanding. However patient does not want to repeat labs and demanded that something be done today. I advised him that he could be evaluated in the ED again. He said he just may do that.

## 2019-12-17 NOTE — Telephone Encounter (Signed)
   Please call. Stop entresto. BP high. Needs BMET. Please ask if he has one set up. If not we need to get BMET.    Please advise to take all other medications.   Set up for CPX test to help [place] determine if shortness of breath/fatigue related heart, lungs, or deconditioning.   Laiana Fratus NP-C  10:45 AM

## 2019-12-18 DIAGNOSIS — I447 Left bundle-branch block, unspecified: Secondary | ICD-10-CM | POA: Diagnosis not present

## 2019-12-18 DIAGNOSIS — R0602 Shortness of breath: Secondary | ICD-10-CM | POA: Diagnosis not present

## 2019-12-18 DIAGNOSIS — I340 Nonrheumatic mitral (valve) insufficiency: Secondary | ICD-10-CM | POA: Diagnosis not present

## 2019-12-18 DIAGNOSIS — I493 Ventricular premature depolarization: Secondary | ICD-10-CM | POA: Diagnosis not present

## 2019-12-18 DIAGNOSIS — I509 Heart failure, unspecified: Secondary | ICD-10-CM | POA: Diagnosis not present

## 2019-12-18 DIAGNOSIS — R05 Cough: Secondary | ICD-10-CM | POA: Diagnosis not present

## 2019-12-18 DIAGNOSIS — I10 Essential (primary) hypertension: Secondary | ICD-10-CM | POA: Diagnosis not present

## 2019-12-18 DIAGNOSIS — Z6829 Body mass index (BMI) 29.0-29.9, adult: Secondary | ICD-10-CM | POA: Diagnosis not present

## 2019-12-18 NOTE — Telephone Encounter (Signed)
Patient notified.  Wants to know why nobody will "fix his heart".  Very frustrated & thinks nobody is telling him what is going on.  Feels like he needs to be in somebody's office today telling him what to do.  Advised him to go to ED if he feels he can not wait for an answer from a provider.  Spent approx. 15 minutes on phone with this patient.

## 2019-12-19 DIAGNOSIS — N183 Chronic kidney disease, stage 3 unspecified: Secondary | ICD-10-CM | POA: Diagnosis not present

## 2019-12-19 DIAGNOSIS — R0602 Shortness of breath: Secondary | ICD-10-CM | POA: Diagnosis not present

## 2019-12-19 DIAGNOSIS — E785 Hyperlipidemia, unspecified: Secondary | ICD-10-CM | POA: Diagnosis present

## 2019-12-19 DIAGNOSIS — Z955 Presence of coronary angioplasty implant and graft: Secondary | ICD-10-CM | POA: Diagnosis not present

## 2019-12-19 DIAGNOSIS — F419 Anxiety disorder, unspecified: Secondary | ICD-10-CM | POA: Diagnosis present

## 2019-12-19 DIAGNOSIS — I255 Ischemic cardiomyopathy: Secondary | ICD-10-CM | POA: Diagnosis present

## 2019-12-19 DIAGNOSIS — Z7902 Long term (current) use of antithrombotics/antiplatelets: Secondary | ICD-10-CM | POA: Diagnosis not present

## 2019-12-19 DIAGNOSIS — I341 Nonrheumatic mitral (valve) prolapse: Secondary | ICD-10-CM | POA: Diagnosis not present

## 2019-12-19 DIAGNOSIS — E1122 Type 2 diabetes mellitus with diabetic chronic kidney disease: Secondary | ICD-10-CM | POA: Diagnosis present

## 2019-12-19 DIAGNOSIS — I493 Ventricular premature depolarization: Secondary | ICD-10-CM | POA: Diagnosis not present

## 2019-12-19 DIAGNOSIS — I2582 Chronic total occlusion of coronary artery: Secondary | ICD-10-CM | POA: Diagnosis present

## 2019-12-19 DIAGNOSIS — I454 Nonspecific intraventricular block: Secondary | ICD-10-CM | POA: Diagnosis present

## 2019-12-19 DIAGNOSIS — K219 Gastro-esophageal reflux disease without esophagitis: Secondary | ICD-10-CM | POA: Diagnosis present

## 2019-12-19 DIAGNOSIS — Z7982 Long term (current) use of aspirin: Secondary | ICD-10-CM | POA: Diagnosis not present

## 2019-12-19 DIAGNOSIS — K257 Chronic gastric ulcer without hemorrhage or perforation: Secondary | ICD-10-CM | POA: Diagnosis present

## 2019-12-19 DIAGNOSIS — I130 Hypertensive heart and chronic kidney disease with heart failure and stage 1 through stage 4 chronic kidney disease, or unspecified chronic kidney disease: Secondary | ICD-10-CM | POA: Diagnosis present

## 2019-12-19 DIAGNOSIS — I5020 Unspecified systolic (congestive) heart failure: Secondary | ICD-10-CM | POA: Diagnosis not present

## 2019-12-19 DIAGNOSIS — I2510 Atherosclerotic heart disease of native coronary artery without angina pectoris: Secondary | ICD-10-CM | POA: Diagnosis present

## 2019-12-19 DIAGNOSIS — I340 Nonrheumatic mitral (valve) insufficiency: Secondary | ICD-10-CM | POA: Diagnosis not present

## 2019-12-19 DIAGNOSIS — R079 Chest pain, unspecified: Secondary | ICD-10-CM | POA: Diagnosis not present

## 2019-12-19 DIAGNOSIS — I5023 Acute on chronic systolic (congestive) heart failure: Secondary | ICD-10-CM | POA: Diagnosis present

## 2019-12-19 DIAGNOSIS — R140 Abdominal distension (gaseous): Secondary | ICD-10-CM | POA: Diagnosis not present

## 2019-12-19 DIAGNOSIS — N1831 Chronic kidney disease, stage 3a: Secondary | ICD-10-CM | POA: Diagnosis present

## 2019-12-19 DIAGNOSIS — R000 Tachycardia, unspecified: Secondary | ICD-10-CM | POA: Diagnosis not present

## 2019-12-19 DIAGNOSIS — I700 Atherosclerosis of aorta: Secondary | ICD-10-CM | POA: Diagnosis not present

## 2019-12-19 DIAGNOSIS — Z79899 Other long term (current) drug therapy: Secondary | ICD-10-CM | POA: Diagnosis not present

## 2019-12-19 DIAGNOSIS — N1832 Chronic kidney disease, stage 3b: Secondary | ICD-10-CM | POA: Diagnosis not present

## 2019-12-19 DIAGNOSIS — I080 Rheumatic disorders of both mitral and aortic valves: Secondary | ICD-10-CM | POA: Diagnosis present

## 2019-12-19 DIAGNOSIS — I4949 Other premature depolarization: Secondary | ICD-10-CM | POA: Diagnosis not present

## 2019-12-27 DIAGNOSIS — I493 Ventricular premature depolarization: Secondary | ICD-10-CM | POA: Diagnosis not present

## 2020-01-01 DIAGNOSIS — I340 Nonrheumatic mitral (valve) insufficiency: Secondary | ICD-10-CM | POA: Diagnosis not present

## 2020-01-01 DIAGNOSIS — E1165 Type 2 diabetes mellitus with hyperglycemia: Secondary | ICD-10-CM | POA: Diagnosis not present

## 2020-01-01 DIAGNOSIS — E7849 Other hyperlipidemia: Secondary | ICD-10-CM | POA: Diagnosis not present

## 2020-01-01 DIAGNOSIS — I10 Essential (primary) hypertension: Secondary | ICD-10-CM | POA: Diagnosis not present

## 2020-01-01 DIAGNOSIS — Z6827 Body mass index (BMI) 27.0-27.9, adult: Secondary | ICD-10-CM | POA: Diagnosis not present

## 2020-01-01 DIAGNOSIS — I509 Heart failure, unspecified: Secondary | ICD-10-CM | POA: Diagnosis not present

## 2020-01-04 DIAGNOSIS — R9389 Abnormal findings on diagnostic imaging of other specified body structures: Secondary | ICD-10-CM | POA: Diagnosis not present

## 2020-01-04 DIAGNOSIS — R931 Abnormal findings on diagnostic imaging of heart and coronary circulation: Secondary | ICD-10-CM | POA: Diagnosis not present

## 2020-01-04 DIAGNOSIS — I493 Ventricular premature depolarization: Secondary | ICD-10-CM | POA: Diagnosis not present

## 2020-01-04 DIAGNOSIS — I2510 Atherosclerotic heart disease of native coronary artery without angina pectoris: Secondary | ICD-10-CM | POA: Diagnosis not present

## 2020-01-04 DIAGNOSIS — I340 Nonrheumatic mitral (valve) insufficiency: Secondary | ICD-10-CM | POA: Diagnosis not present

## 2020-01-04 DIAGNOSIS — Z7982 Long term (current) use of aspirin: Secondary | ICD-10-CM | POA: Diagnosis not present

## 2020-01-04 DIAGNOSIS — I5023 Acute on chronic systolic (congestive) heart failure: Secondary | ICD-10-CM | POA: Diagnosis not present

## 2020-01-04 DIAGNOSIS — I110 Hypertensive heart disease with heart failure: Secondary | ICD-10-CM | POA: Diagnosis not present

## 2020-01-04 DIAGNOSIS — E785 Hyperlipidemia, unspecified: Secondary | ICD-10-CM | POA: Diagnosis not present

## 2020-01-04 DIAGNOSIS — I447 Left bundle-branch block, unspecified: Secondary | ICD-10-CM | POA: Diagnosis not present

## 2020-01-08 NOTE — Addendum Note (Signed)
Encounter addended by: Micki Riley, RN on: 01/08/2020 3:35 PM  Actions taken: Imaging Exam ended

## 2020-01-14 DIAGNOSIS — I493 Ventricular premature depolarization: Secondary | ICD-10-CM | POA: Diagnosis not present

## 2020-01-15 DIAGNOSIS — I472 Ventricular tachycardia: Secondary | ICD-10-CM | POA: Diagnosis not present

## 2020-01-15 DIAGNOSIS — I471 Supraventricular tachycardia: Secondary | ICD-10-CM | POA: Diagnosis not present

## 2020-01-17 ENCOUNTER — Encounter (HOSPITAL_COMMUNITY): Payer: Medicare Other | Admitting: Internal Medicine

## 2020-01-24 DIAGNOSIS — I340 Nonrheumatic mitral (valve) insufficiency: Secondary | ICD-10-CM | POA: Diagnosis not present

## 2020-01-24 DIAGNOSIS — Z6827 Body mass index (BMI) 27.0-27.9, adult: Secondary | ICD-10-CM | POA: Diagnosis not present

## 2020-01-24 DIAGNOSIS — I509 Heart failure, unspecified: Secondary | ICD-10-CM | POA: Diagnosis not present

## 2020-02-01 DIAGNOSIS — I2510 Atherosclerotic heart disease of native coronary artery without angina pectoris: Secondary | ICD-10-CM | POA: Diagnosis not present

## 2020-02-01 DIAGNOSIS — I130 Hypertensive heart and chronic kidney disease with heart failure and stage 1 through stage 4 chronic kidney disease, or unspecified chronic kidney disease: Secondary | ICD-10-CM | POA: Diagnosis not present

## 2020-02-01 DIAGNOSIS — N183 Chronic kidney disease, stage 3 unspecified: Secondary | ICD-10-CM | POA: Diagnosis not present

## 2020-02-01 DIAGNOSIS — I110 Hypertensive heart disease with heart failure: Secondary | ICD-10-CM | POA: Diagnosis not present

## 2020-02-01 DIAGNOSIS — I340 Nonrheumatic mitral (valve) insufficiency: Secondary | ICD-10-CM | POA: Diagnosis not present

## 2020-02-01 DIAGNOSIS — I5043 Acute on chronic combined systolic (congestive) and diastolic (congestive) heart failure: Secondary | ICD-10-CM | POA: Diagnosis not present

## 2020-02-05 ENCOUNTER — Ambulatory Visit: Payer: Medicare Other | Admitting: Cardiology

## 2020-02-07 DIAGNOSIS — Z7982 Long term (current) use of aspirin: Secondary | ICD-10-CM | POA: Diagnosis not present

## 2020-02-07 DIAGNOSIS — I10 Essential (primary) hypertension: Secondary | ICD-10-CM | POA: Diagnosis not present

## 2020-02-07 DIAGNOSIS — I471 Supraventricular tachycardia: Secondary | ICD-10-CM | POA: Diagnosis not present

## 2020-02-07 DIAGNOSIS — I255 Ischemic cardiomyopathy: Secondary | ICD-10-CM | POA: Diagnosis not present

## 2020-02-07 DIAGNOSIS — I340 Nonrheumatic mitral (valve) insufficiency: Secondary | ICD-10-CM | POA: Diagnosis not present

## 2020-02-07 DIAGNOSIS — I2510 Atherosclerotic heart disease of native coronary artery without angina pectoris: Secondary | ICD-10-CM | POA: Diagnosis not present

## 2020-02-07 DIAGNOSIS — I341 Nonrheumatic mitral (valve) prolapse: Secondary | ICD-10-CM | POA: Diagnosis not present

## 2020-02-07 DIAGNOSIS — Z79899 Other long term (current) drug therapy: Secondary | ICD-10-CM | POA: Diagnosis not present

## 2020-02-07 DIAGNOSIS — R5383 Other fatigue: Secondary | ICD-10-CM | POA: Diagnosis not present

## 2020-02-07 DIAGNOSIS — R0602 Shortness of breath: Secondary | ICD-10-CM | POA: Diagnosis not present

## 2020-02-07 DIAGNOSIS — I447 Left bundle-branch block, unspecified: Secondary | ICD-10-CM | POA: Diagnosis not present

## 2020-02-07 DIAGNOSIS — I452 Bifascicular block: Secondary | ICD-10-CM | POA: Diagnosis not present

## 2020-02-07 DIAGNOSIS — R001 Bradycardia, unspecified: Secondary | ICD-10-CM | POA: Diagnosis not present

## 2020-02-08 ENCOUNTER — Ambulatory Visit: Payer: Medicare Other | Admitting: Cardiology

## 2020-02-11 DIAGNOSIS — Z01810 Encounter for preprocedural cardiovascular examination: Secondary | ICD-10-CM | POA: Diagnosis not present

## 2020-02-11 DIAGNOSIS — Z01812 Encounter for preprocedural laboratory examination: Secondary | ICD-10-CM | POA: Diagnosis not present

## 2020-02-11 DIAGNOSIS — I2510 Atherosclerotic heart disease of native coronary artery without angina pectoris: Secondary | ICD-10-CM | POA: Diagnosis not present

## 2020-02-20 DIAGNOSIS — I2510 Atherosclerotic heart disease of native coronary artery without angina pectoris: Secondary | ICD-10-CM | POA: Diagnosis not present

## 2020-02-20 DIAGNOSIS — Z4659 Encounter for fitting and adjustment of other gastrointestinal appliance and device: Secondary | ICD-10-CM | POA: Diagnosis not present

## 2020-02-20 DIAGNOSIS — I129 Hypertensive chronic kidney disease with stage 1 through stage 4 chronic kidney disease, or unspecified chronic kidney disease: Secondary | ICD-10-CM | POA: Diagnosis not present

## 2020-02-20 DIAGNOSIS — I255 Ischemic cardiomyopathy: Secondary | ICD-10-CM | POA: Diagnosis present

## 2020-02-20 DIAGNOSIS — I351 Nonrheumatic aortic (valve) insufficiency: Secondary | ICD-10-CM | POA: Diagnosis not present

## 2020-02-20 DIAGNOSIS — J90 Pleural effusion, not elsewhere classified: Secondary | ICD-10-CM | POA: Diagnosis not present

## 2020-02-20 DIAGNOSIS — D696 Thrombocytopenia, unspecified: Secondary | ICD-10-CM | POA: Diagnosis not present

## 2020-02-20 DIAGNOSIS — I130 Hypertensive heart and chronic kidney disease with heart failure and stage 1 through stage 4 chronic kidney disease, or unspecified chronic kidney disease: Secondary | ICD-10-CM | POA: Diagnosis present

## 2020-02-20 DIAGNOSIS — Z951 Presence of aortocoronary bypass graft: Secondary | ICD-10-CM | POA: Diagnosis not present

## 2020-02-20 DIAGNOSIS — Z955 Presence of coronary angioplasty implant and graft: Secondary | ICD-10-CM | POA: Diagnosis not present

## 2020-02-20 DIAGNOSIS — E785 Hyperlipidemia, unspecified: Secondary | ICD-10-CM | POA: Diagnosis present

## 2020-02-20 DIAGNOSIS — J9811 Atelectasis: Secondary | ICD-10-CM | POA: Diagnosis not present

## 2020-02-20 DIAGNOSIS — N183 Chronic kidney disease, stage 3 unspecified: Secondary | ICD-10-CM | POA: Diagnosis not present

## 2020-02-20 DIAGNOSIS — I340 Nonrheumatic mitral (valve) insufficiency: Secondary | ICD-10-CM | POA: Diagnosis not present

## 2020-02-20 DIAGNOSIS — I2582 Chronic total occlusion of coronary artery: Secondary | ICD-10-CM | POA: Diagnosis present

## 2020-02-20 DIAGNOSIS — I517 Cardiomegaly: Secondary | ICD-10-CM | POA: Diagnosis not present

## 2020-02-20 DIAGNOSIS — J95830 Postprocedural hemorrhage and hematoma of a respiratory system organ or structure following a respiratory system procedure: Secondary | ICD-10-CM | POA: Diagnosis not present

## 2020-02-20 DIAGNOSIS — I5042 Chronic combined systolic (congestive) and diastolic (congestive) heart failure: Secondary | ICD-10-CM | POA: Diagnosis present

## 2020-02-20 DIAGNOSIS — Z8711 Personal history of peptic ulcer disease: Secondary | ICD-10-CM | POA: Diagnosis not present

## 2020-02-20 DIAGNOSIS — I080 Rheumatic disorders of both mitral and aortic valves: Secondary | ICD-10-CM | POA: Diagnosis present

## 2020-02-20 DIAGNOSIS — I97611 Postprocedural hemorrhage and hematoma of a circulatory system organ or structure following cardiac bypass: Secondary | ICD-10-CM | POA: Diagnosis not present

## 2020-02-20 DIAGNOSIS — I2583 Coronary atherosclerosis due to lipid rich plaque: Secondary | ICD-10-CM | POA: Diagnosis present

## 2020-03-03 DIAGNOSIS — Z23 Encounter for immunization: Secondary | ICD-10-CM | POA: Diagnosis not present

## 2020-03-26 DIAGNOSIS — R011 Cardiac murmur, unspecified: Secondary | ICD-10-CM | POA: Diagnosis not present

## 2020-03-26 DIAGNOSIS — R0602 Shortness of breath: Secondary | ICD-10-CM | POA: Diagnosis not present

## 2020-03-26 DIAGNOSIS — I255 Ischemic cardiomyopathy: Secondary | ICD-10-CM | POA: Diagnosis not present

## 2020-03-26 DIAGNOSIS — I340 Nonrheumatic mitral (valve) insufficiency: Secondary | ICD-10-CM | POA: Diagnosis not present

## 2020-03-26 DIAGNOSIS — I509 Heart failure, unspecified: Secondary | ICD-10-CM | POA: Diagnosis not present

## 2020-04-03 DIAGNOSIS — Z48812 Encounter for surgical aftercare following surgery on the circulatory system: Secondary | ICD-10-CM | POA: Diagnosis not present

## 2020-04-03 DIAGNOSIS — Z951 Presence of aortocoronary bypass graft: Secondary | ICD-10-CM | POA: Diagnosis not present

## 2020-04-03 DIAGNOSIS — Z79899 Other long term (current) drug therapy: Secondary | ICD-10-CM | POA: Diagnosis not present

## 2020-04-07 DIAGNOSIS — I5043 Acute on chronic combined systolic (congestive) and diastolic (congestive) heart failure: Secondary | ICD-10-CM | POA: Diagnosis not present

## 2020-04-07 DIAGNOSIS — Z951 Presence of aortocoronary bypass graft: Secondary | ICD-10-CM | POA: Diagnosis not present

## 2020-04-07 DIAGNOSIS — I5023 Acute on chronic systolic (congestive) heart failure: Secondary | ICD-10-CM | POA: Diagnosis not present

## 2020-04-07 DIAGNOSIS — I110 Hypertensive heart disease with heart failure: Secondary | ICD-10-CM | POA: Diagnosis not present

## 2020-04-07 DIAGNOSIS — I2510 Atherosclerotic heart disease of native coronary artery without angina pectoris: Secondary | ICD-10-CM | POA: Diagnosis not present

## 2020-04-07 DIAGNOSIS — I5033 Acute on chronic diastolic (congestive) heart failure: Secondary | ICD-10-CM | POA: Diagnosis not present

## 2020-04-07 DIAGNOSIS — Z7902 Long term (current) use of antithrombotics/antiplatelets: Secondary | ICD-10-CM | POA: Diagnosis not present

## 2020-04-07 DIAGNOSIS — E785 Hyperlipidemia, unspecified: Secondary | ICD-10-CM | POA: Diagnosis not present

## 2020-04-07 DIAGNOSIS — I340 Nonrheumatic mitral (valve) insufficiency: Secondary | ICD-10-CM | POA: Diagnosis not present

## 2020-04-09 DIAGNOSIS — Z23 Encounter for immunization: Secondary | ICD-10-CM | POA: Diagnosis not present

## 2020-04-27 LAB — ECHO TEE
MV M vel: 5.51 m/s
MV Peak grad: 121.4 mmHg
Radius: 0.7 cm

## 2020-05-21 DIAGNOSIS — H538 Other visual disturbances: Secondary | ICD-10-CM | POA: Diagnosis not present

## 2020-05-29 DIAGNOSIS — I493 Ventricular premature depolarization: Secondary | ICD-10-CM | POA: Diagnosis not present

## 2020-05-29 DIAGNOSIS — I2510 Atherosclerotic heart disease of native coronary artery without angina pectoris: Secondary | ICD-10-CM | POA: Diagnosis not present

## 2020-05-29 DIAGNOSIS — I5020 Unspecified systolic (congestive) heart failure: Secondary | ICD-10-CM | POA: Diagnosis not present

## 2020-05-29 DIAGNOSIS — I447 Left bundle-branch block, unspecified: Secondary | ICD-10-CM | POA: Diagnosis not present

## 2020-05-29 DIAGNOSIS — Z7982 Long term (current) use of aspirin: Secondary | ICD-10-CM | POA: Diagnosis not present

## 2020-05-29 DIAGNOSIS — I5023 Acute on chronic systolic (congestive) heart failure: Secondary | ICD-10-CM | POA: Diagnosis not present

## 2020-05-29 DIAGNOSIS — Z79899 Other long term (current) drug therapy: Secondary | ICD-10-CM | POA: Diagnosis not present

## 2020-05-29 DIAGNOSIS — I110 Hypertensive heart disease with heart failure: Secondary | ICD-10-CM | POA: Diagnosis not present

## 2020-05-29 DIAGNOSIS — I5043 Acute on chronic combined systolic (congestive) and diastolic (congestive) heart failure: Secondary | ICD-10-CM | POA: Diagnosis not present

## 2020-05-29 DIAGNOSIS — I340 Nonrheumatic mitral (valve) insufficiency: Secondary | ICD-10-CM | POA: Diagnosis not present

## 2020-05-29 DIAGNOSIS — E1165 Type 2 diabetes mellitus with hyperglycemia: Secondary | ICD-10-CM | POA: Diagnosis not present

## 2020-05-29 DIAGNOSIS — Z951 Presence of aortocoronary bypass graft: Secondary | ICD-10-CM | POA: Diagnosis not present

## 2020-05-29 DIAGNOSIS — Z7902 Long term (current) use of antithrombotics/antiplatelets: Secondary | ICD-10-CM | POA: Diagnosis not present

## 2020-05-29 DIAGNOSIS — Z7984 Long term (current) use of oral hypoglycemic drugs: Secondary | ICD-10-CM | POA: Diagnosis not present

## 2020-07-03 DIAGNOSIS — J392 Other diseases of pharynx: Secondary | ICD-10-CM | POA: Diagnosis not present

## 2020-08-25 DIAGNOSIS — I110 Hypertensive heart disease with heart failure: Secondary | ICD-10-CM | POA: Diagnosis not present

## 2020-08-25 DIAGNOSIS — Z7982 Long term (current) use of aspirin: Secondary | ICD-10-CM | POA: Diagnosis not present

## 2020-08-25 DIAGNOSIS — I2510 Atherosclerotic heart disease of native coronary artery without angina pectoris: Secondary | ICD-10-CM | POA: Diagnosis not present

## 2020-08-25 DIAGNOSIS — I340 Nonrheumatic mitral (valve) insufficiency: Secondary | ICD-10-CM | POA: Diagnosis not present

## 2020-08-25 DIAGNOSIS — I5020 Unspecified systolic (congestive) heart failure: Secondary | ICD-10-CM | POA: Diagnosis not present

## 2020-08-25 DIAGNOSIS — Z951 Presence of aortocoronary bypass graft: Secondary | ICD-10-CM | POA: Diagnosis not present

## 2020-08-25 DIAGNOSIS — Z79899 Other long term (current) drug therapy: Secondary | ICD-10-CM | POA: Diagnosis not present

## 2020-08-25 DIAGNOSIS — I5042 Chronic combined systolic (congestive) and diastolic (congestive) heart failure: Secondary | ICD-10-CM | POA: Diagnosis not present

## 2020-10-02 DIAGNOSIS — R509 Fever, unspecified: Secondary | ICD-10-CM | POA: Diagnosis not present

## 2020-10-02 DIAGNOSIS — Z20828 Contact with and (suspected) exposure to other viral communicable diseases: Secondary | ICD-10-CM | POA: Diagnosis not present

## 2021-02-27 DIAGNOSIS — Z79899 Other long term (current) drug therapy: Secondary | ICD-10-CM | POA: Diagnosis not present

## 2021-02-27 DIAGNOSIS — N183 Chronic kidney disease, stage 3 unspecified: Secondary | ICD-10-CM | POA: Diagnosis not present

## 2021-02-27 DIAGNOSIS — Z7982 Long term (current) use of aspirin: Secondary | ICD-10-CM | POA: Diagnosis not present

## 2021-02-27 DIAGNOSIS — I110 Hypertensive heart disease with heart failure: Secondary | ICD-10-CM | POA: Diagnosis not present

## 2021-02-27 DIAGNOSIS — I5023 Acute on chronic systolic (congestive) heart failure: Secondary | ICD-10-CM | POA: Diagnosis not present

## 2021-02-27 DIAGNOSIS — I5042 Chronic combined systolic (congestive) and diastolic (congestive) heart failure: Secondary | ICD-10-CM | POA: Diagnosis not present

## 2021-02-27 DIAGNOSIS — I130 Hypertensive heart and chronic kidney disease with heart failure and stage 1 through stage 4 chronic kidney disease, or unspecified chronic kidney disease: Secondary | ICD-10-CM | POA: Diagnosis not present

## 2021-02-27 DIAGNOSIS — I493 Ventricular premature depolarization: Secondary | ICD-10-CM | POA: Diagnosis not present

## 2021-02-27 DIAGNOSIS — Z951 Presence of aortocoronary bypass graft: Secondary | ICD-10-CM | POA: Diagnosis not present

## 2021-02-27 DIAGNOSIS — I2510 Atherosclerotic heart disease of native coronary artery without angina pectoris: Secondary | ICD-10-CM | POA: Diagnosis not present

## 2021-02-27 DIAGNOSIS — I341 Nonrheumatic mitral (valve) prolapse: Secondary | ICD-10-CM | POA: Diagnosis not present

## 2021-02-27 DIAGNOSIS — I340 Nonrheumatic mitral (valve) insufficiency: Secondary | ICD-10-CM | POA: Diagnosis not present

## 2021-02-27 DIAGNOSIS — I2582 Chronic total occlusion of coronary artery: Secondary | ICD-10-CM | POA: Diagnosis not present

## 2021-03-11 DIAGNOSIS — Z23 Encounter for immunization: Secondary | ICD-10-CM | POA: Diagnosis not present

## 2021-04-15 DIAGNOSIS — Z23 Encounter for immunization: Secondary | ICD-10-CM | POA: Diagnosis not present

## 2021-04-28 DIAGNOSIS — J069 Acute upper respiratory infection, unspecified: Secondary | ICD-10-CM | POA: Diagnosis not present

## 2021-04-28 DIAGNOSIS — Z20828 Contact with and (suspected) exposure to other viral communicable diseases: Secondary | ICD-10-CM | POA: Diagnosis not present

## 2021-07-01 IMAGING — CT CT ABD-PELV W/ CM
2 of 5 series · 16 of 46 positions shown, 18 images · IV contrast (Omnipaque or Isovue)
Comparison: July 16, 2012

CLINICAL DATA: Abdominal pain x3 weeks.

EXAM:
CT ABDOMEN AND PELVIS WITH CONTRAST
TECHNIQUE: Multidetector CT imaging of the abdomen and pelvis was performed
using the standard protocol following bolus administration of
intravenous contrast.
CONTRAST:  75mL OMNIPAQUE IOHEXOL 300 MG/ML  SOLN

[Series 2: axial st · axial · 0.95mm/px · z∈[-306,+149]mm · 13 of 107 slices shown, 15 images]
[im 8/107  soft-tissue]
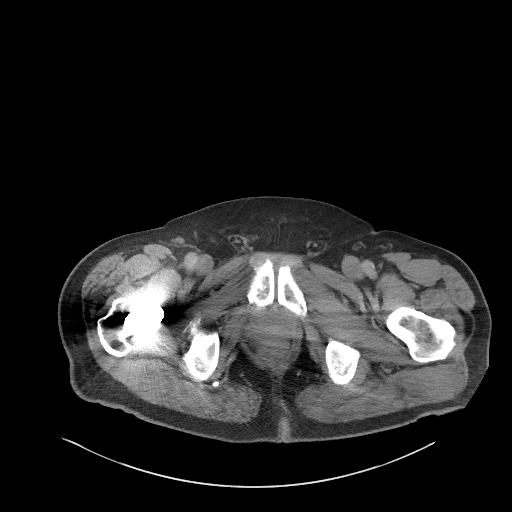
[im 8/107  bone]
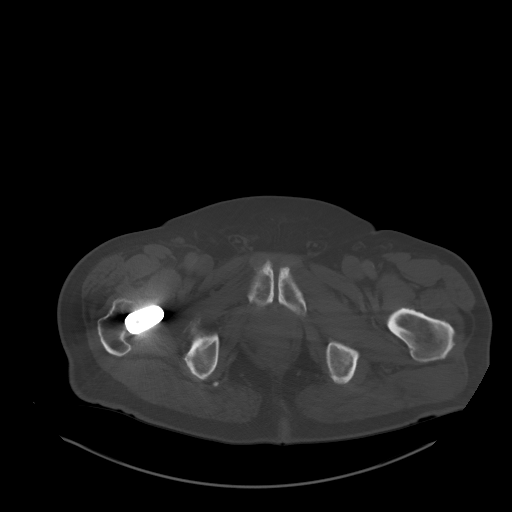
[im 15/107  soft-tissue]
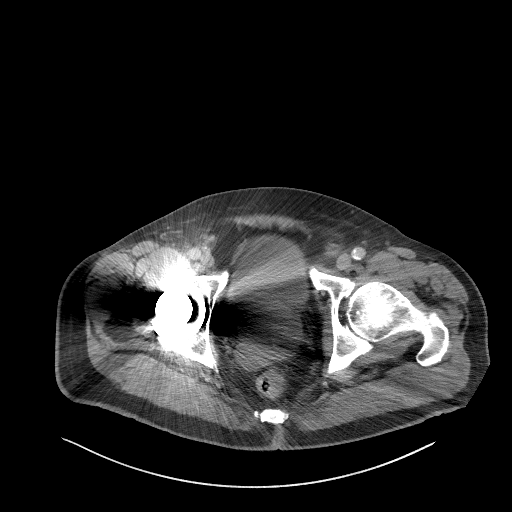
[im 22/107  soft-tissue]
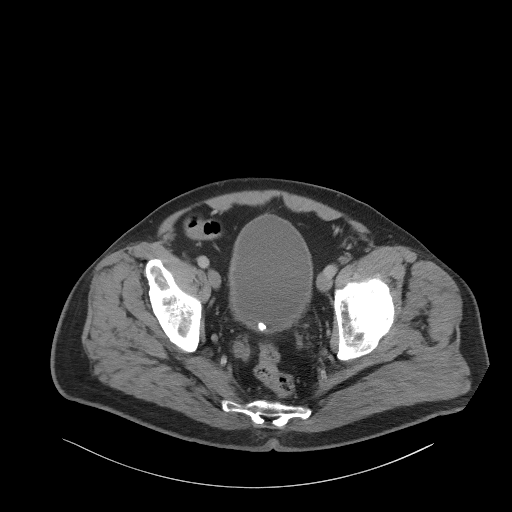
[im 29/107  soft-tissue]
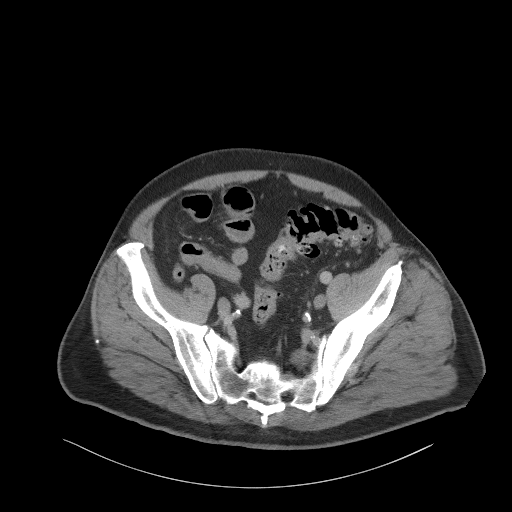
[im 36/107  soft-tissue]
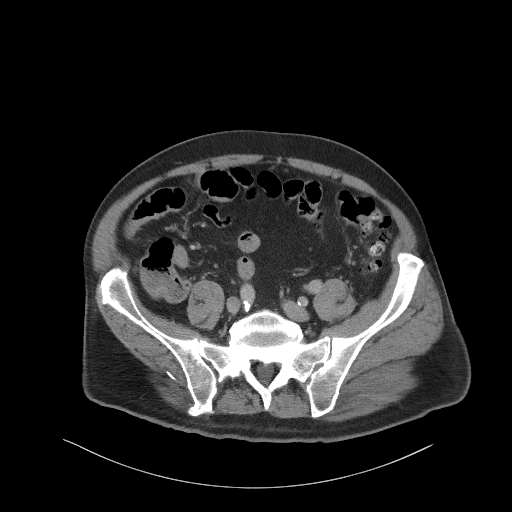
[im 43/107  soft-tissue]
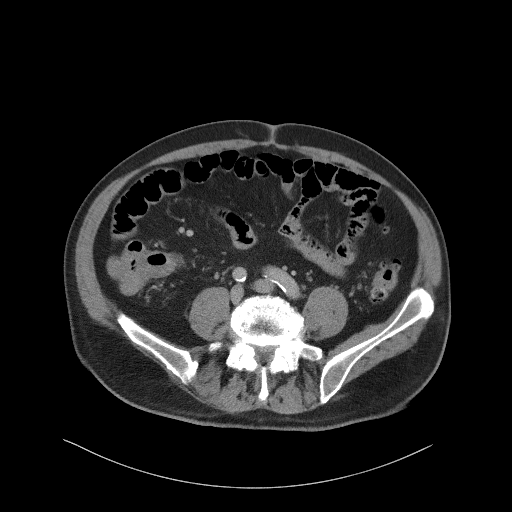
[im 57/107  soft-tissue]
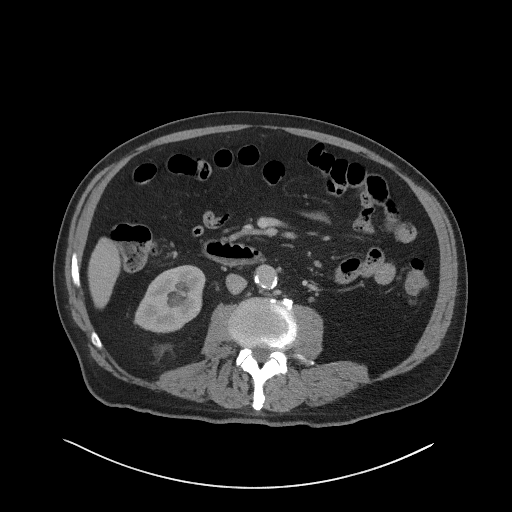
[im 64/107  soft-tissue]
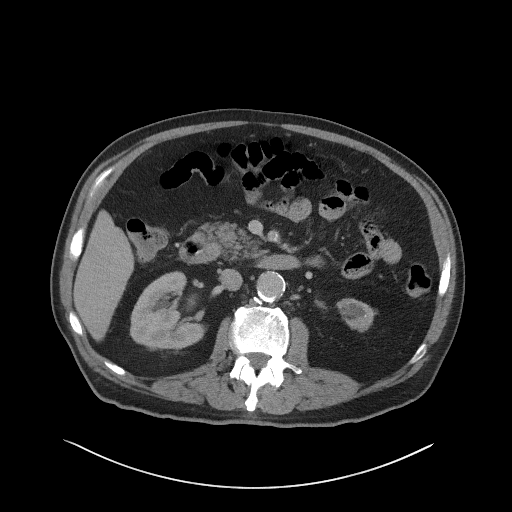
[im 71/107  soft-tissue]
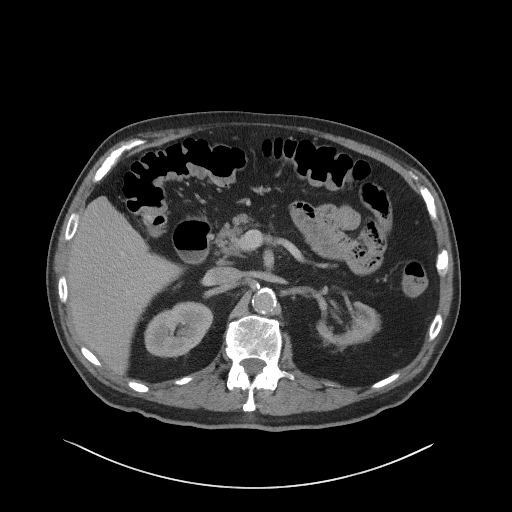
[im 71/107  bone]
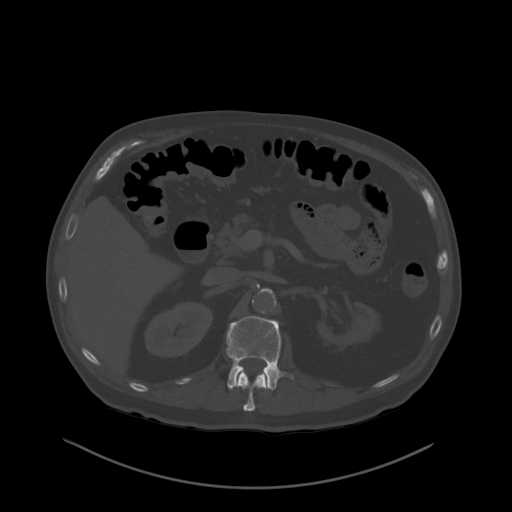
[im 78/107  soft-tissue]
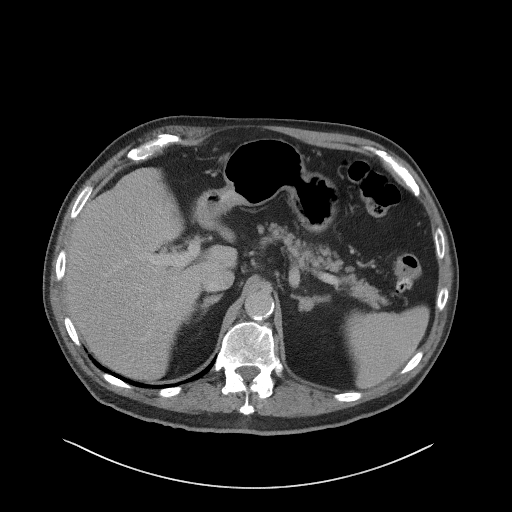
[im 85/107  soft-tissue]
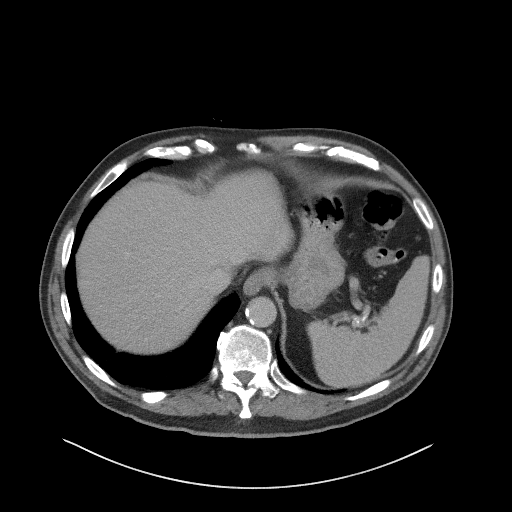
[im 92/107  soft-tissue]
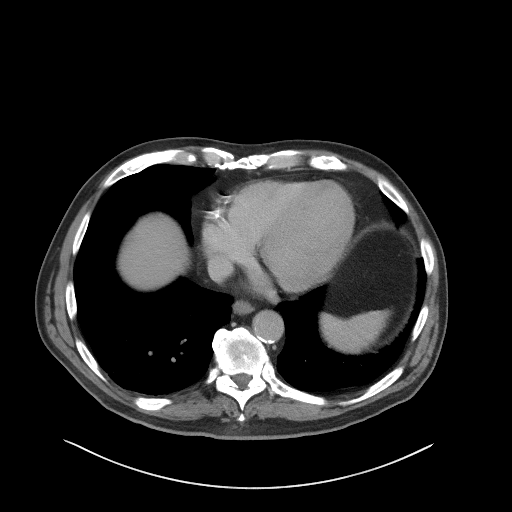
[im 99/107  soft-tissue]
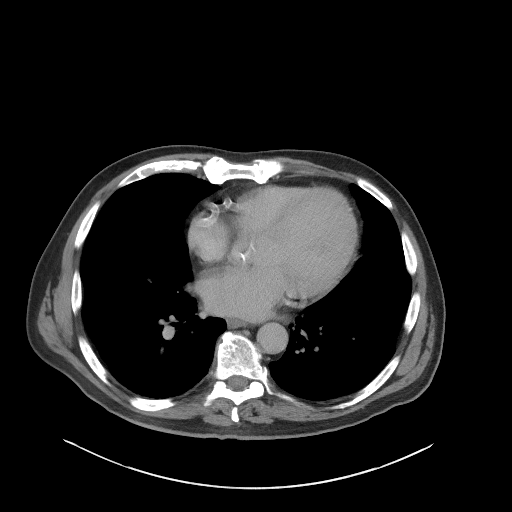

[Series 5: coronal st · coronal · 0.93mm/px · 3 of 105 slices shown]
[im 35/105  soft-tissue]
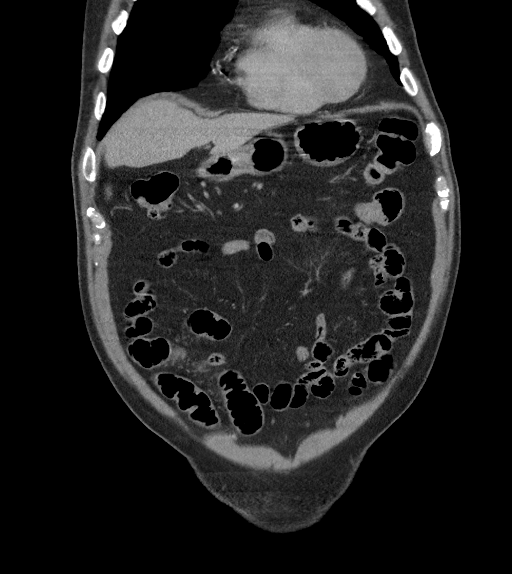
[im 47/105  soft-tissue]
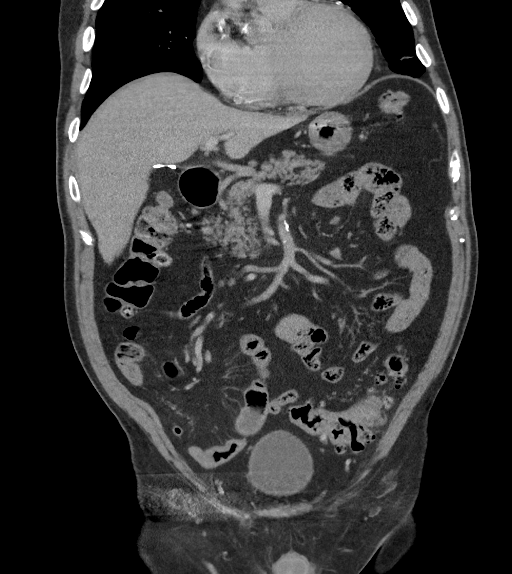
[im 58/105  soft-tissue]
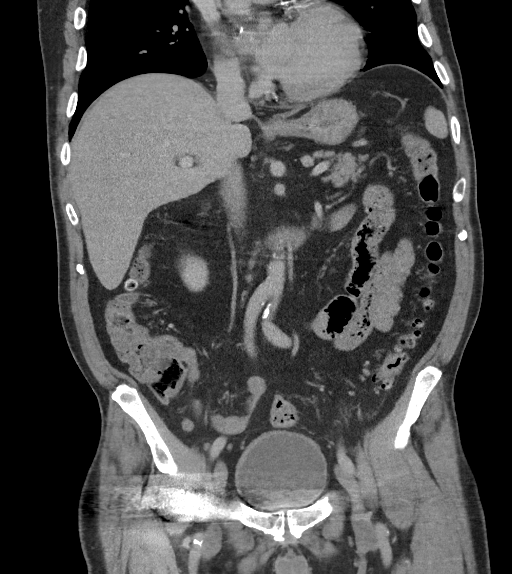

[16 of 46 positions shown; findings below may reference images not displayed]

FINDINGS: Lower chest: No acute abnormality.

Hepatobiliary: No focal liver abnormality is seen. Status post
cholecystectomy. No biliary dilatation.

Pancreas: Unremarkable. No pancreatic ductal dilatation or
surrounding inflammatory changes.

Spleen: Normal in size without focal abnormality.

Adrenals/Urinary Tract: Right adrenal gland is normal in appearance.
A 1.3 cm isodense left adrenal mass is seen. The left kidney is
atrophic in appearance. The right kidney is normal in size, without
renal calculi or hydronephrosis. A 2.0 cm cyst is seen along the
posterior aspect of the mid right kidney. A 1.1 cm cyst is seen
within the medial aspect of the mid left kidney. A 9 mm
calcification is seen within the dependent portion of the urinary
bladder.

Stomach/Bowel: Stomach is within normal limits. Appendix appears
normal. No evidence of bowel dilatation. Noninflamed diverticula are
seen throughout the large bowel.

Vascular/Lymphatic: There is marked severity calcification of the
abdominal aorta and bilateral common iliac arteries, without
evidence of aneurysmal dilatation or dissection. No enlarged
abdominal or pelvic lymph nodes.

Reproductive: There is moderate to marked severity enlargement of
the prostate gland.

Other: No abdominal wall hernia or abnormality. No abdominopelvic
ascites.

Musculoskeletal: Multilevel degenerative changes seen throughout the
lumbar spine.

There is a total right hip replacement with associated streak
artifact and subsequently limited evaluation of the adjacent osseous
and soft tissue structures.
IMPRESSION: 1. Colonic diverticulosis without evidence of diverticulitis.
2. Evidence of prior cholecystectomy.
3. Atrophic left kidney with small bilateral renal cysts.
4. Moderate to marked severity enlargement of the prostate gland.
5. 9 mm urinary bladder calculus.
6. Left adrenal mass which may represent adrenal adenoma.

Aortic Atherosclerosis ([id number].Z).

## 2021-08-03 DIAGNOSIS — I25118 Atherosclerotic heart disease of native coronary artery with other forms of angina pectoris: Secondary | ICD-10-CM | POA: Diagnosis not present

## 2021-08-03 DIAGNOSIS — I340 Nonrheumatic mitral (valve) insufficiency: Secondary | ICD-10-CM | POA: Diagnosis not present

## 2021-08-03 DIAGNOSIS — I2582 Chronic total occlusion of coronary artery: Secondary | ICD-10-CM | POA: Diagnosis not present

## 2021-08-03 DIAGNOSIS — I2510 Atherosclerotic heart disease of native coronary artery without angina pectoris: Secondary | ICD-10-CM | POA: Diagnosis not present

## 2021-08-03 DIAGNOSIS — Z951 Presence of aortocoronary bypass graft: Secondary | ICD-10-CM | POA: Diagnosis not present

## 2021-08-03 DIAGNOSIS — Z7902 Long term (current) use of antithrombotics/antiplatelets: Secondary | ICD-10-CM | POA: Diagnosis not present

## 2021-08-03 DIAGNOSIS — I110 Hypertensive heart disease with heart failure: Secondary | ICD-10-CM | POA: Diagnosis not present

## 2021-08-03 DIAGNOSIS — Z7982 Long term (current) use of aspirin: Secondary | ICD-10-CM | POA: Diagnosis not present

## 2021-08-03 DIAGNOSIS — E1159 Type 2 diabetes mellitus with other circulatory complications: Secondary | ICD-10-CM | POA: Diagnosis not present

## 2021-08-03 DIAGNOSIS — Z79899 Other long term (current) drug therapy: Secondary | ICD-10-CM | POA: Diagnosis not present

## 2021-08-03 DIAGNOSIS — I25119 Atherosclerotic heart disease of native coronary artery with unspecified angina pectoris: Secondary | ICD-10-CM | POA: Diagnosis not present

## 2021-08-03 DIAGNOSIS — I509 Heart failure, unspecified: Secondary | ICD-10-CM | POA: Diagnosis not present

## 2021-08-03 DIAGNOSIS — I517 Cardiomegaly: Secondary | ICD-10-CM | POA: Diagnosis not present

## 2021-08-03 DIAGNOSIS — Z955 Presence of coronary angioplasty implant and graft: Secondary | ICD-10-CM | POA: Diagnosis not present

## 2021-08-04 DIAGNOSIS — R079 Chest pain, unspecified: Secondary | ICD-10-CM | POA: Diagnosis not present

## 2021-08-04 DIAGNOSIS — I509 Heart failure, unspecified: Secondary | ICD-10-CM | POA: Diagnosis not present

## 2021-08-04 DIAGNOSIS — Z7902 Long term (current) use of antithrombotics/antiplatelets: Secondary | ICD-10-CM | POA: Diagnosis not present

## 2021-08-04 DIAGNOSIS — I2582 Chronic total occlusion of coronary artery: Secondary | ICD-10-CM | POA: Diagnosis not present

## 2021-08-04 DIAGNOSIS — Z7982 Long term (current) use of aspirin: Secondary | ICD-10-CM | POA: Diagnosis not present

## 2021-08-04 DIAGNOSIS — I440 Atrioventricular block, first degree: Secondary | ICD-10-CM | POA: Diagnosis not present

## 2021-08-04 DIAGNOSIS — I493 Ventricular premature depolarization: Secondary | ICD-10-CM | POA: Diagnosis not present

## 2021-08-04 DIAGNOSIS — Z955 Presence of coronary angioplasty implant and graft: Secondary | ICD-10-CM | POA: Diagnosis not present

## 2021-08-04 DIAGNOSIS — Z951 Presence of aortocoronary bypass graft: Secondary | ICD-10-CM | POA: Diagnosis not present

## 2021-08-04 DIAGNOSIS — I454 Nonspecific intraventricular block: Secondary | ICD-10-CM | POA: Diagnosis not present

## 2021-08-04 DIAGNOSIS — I110 Hypertensive heart disease with heart failure: Secondary | ICD-10-CM | POA: Diagnosis not present

## 2021-08-04 DIAGNOSIS — E1159 Type 2 diabetes mellitus with other circulatory complications: Secondary | ICD-10-CM | POA: Diagnosis not present

## 2021-08-04 DIAGNOSIS — Z79899 Other long term (current) drug therapy: Secondary | ICD-10-CM | POA: Diagnosis not present

## 2021-08-04 DIAGNOSIS — I25119 Atherosclerotic heart disease of native coronary artery with unspecified angina pectoris: Secondary | ICD-10-CM | POA: Diagnosis not present

## 2021-09-01 DIAGNOSIS — I259 Chronic ischemic heart disease, unspecified: Secondary | ICD-10-CM | POA: Diagnosis not present

## 2021-09-05 IMAGING — US US ABDOMEN LIMITED
1 series · 14 of 25 positions shown · non-contrast
Comparison: None.

CLINICAL DATA: Assess right upper quadrant and abdomen for ascites.
History of prior cholecystectomy.

EXAM:
ULTRASOUND ABDOMEN LIMITED RIGHT UPPER QUADRANT

[Series 1: us abdomen limited · 14 of 37 slices shown]
[im 1/37]
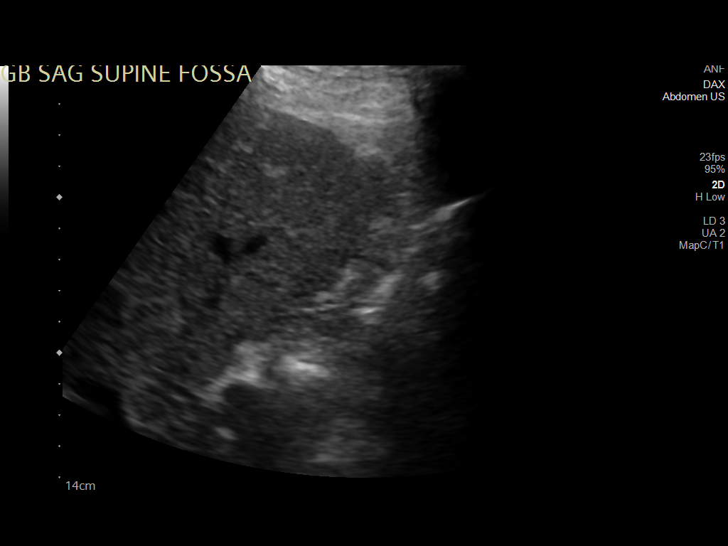
[im 4/37]
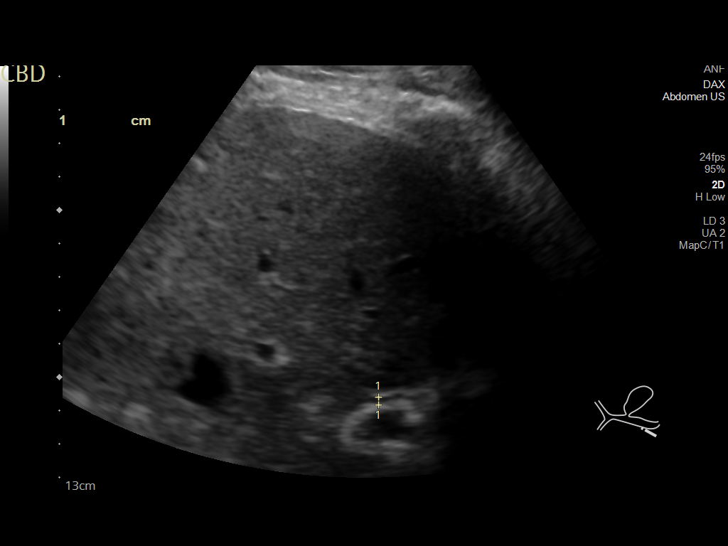
[im 7/37]
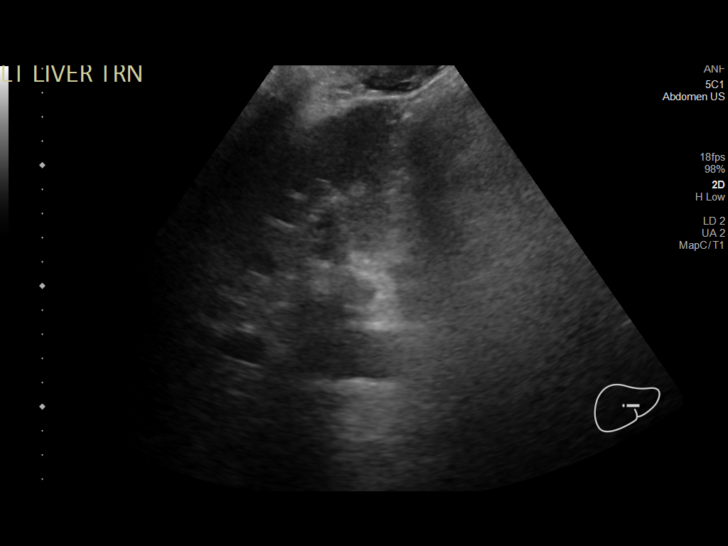
[im 10/37]
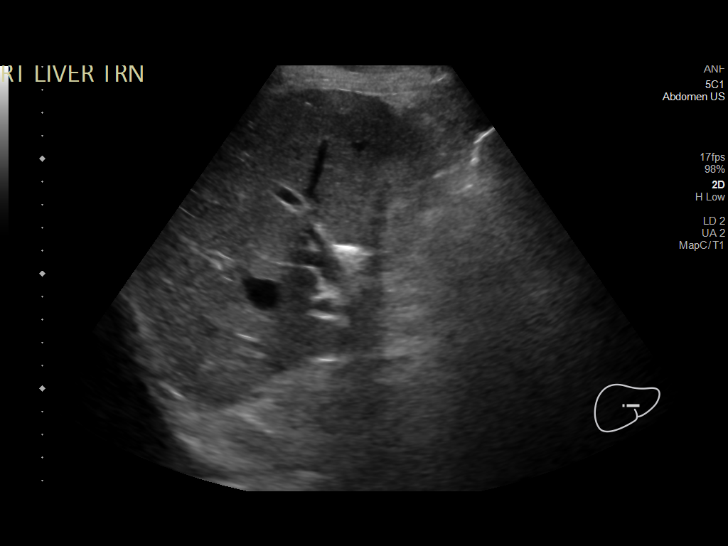
[im 13/37]
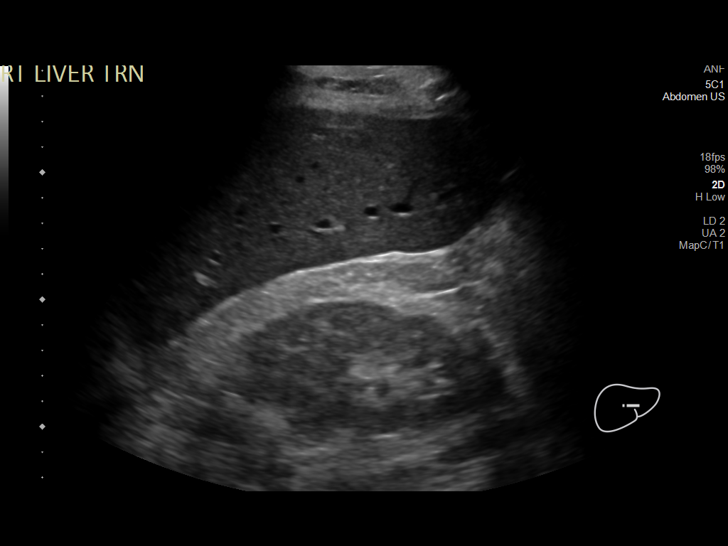
[im 14/37]
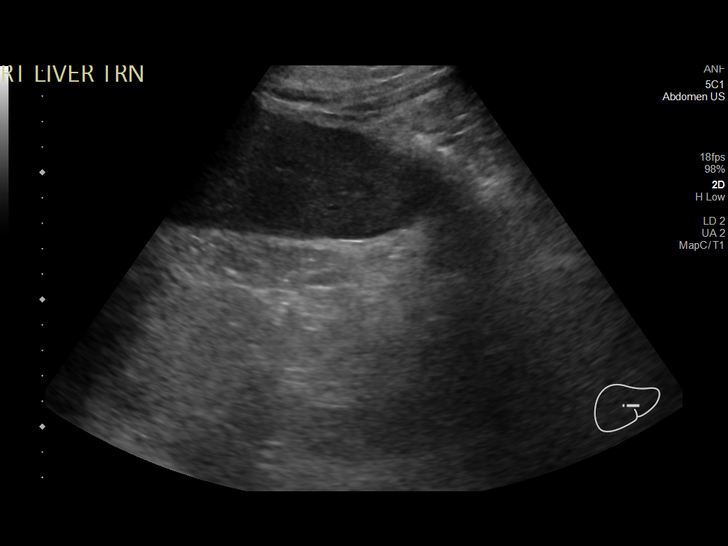
[im 17/37]
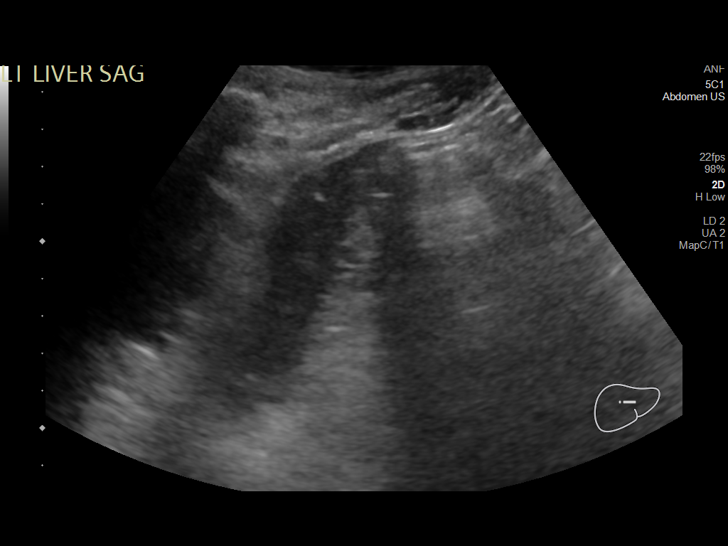
[im 20/37]
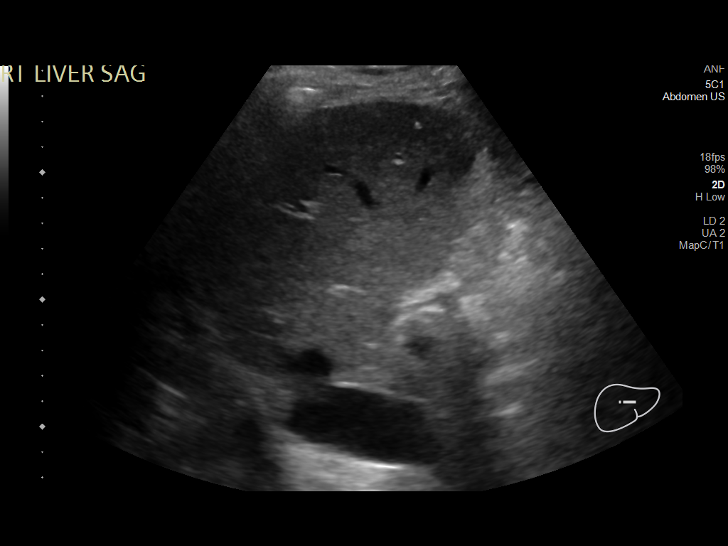
[im 23/37]
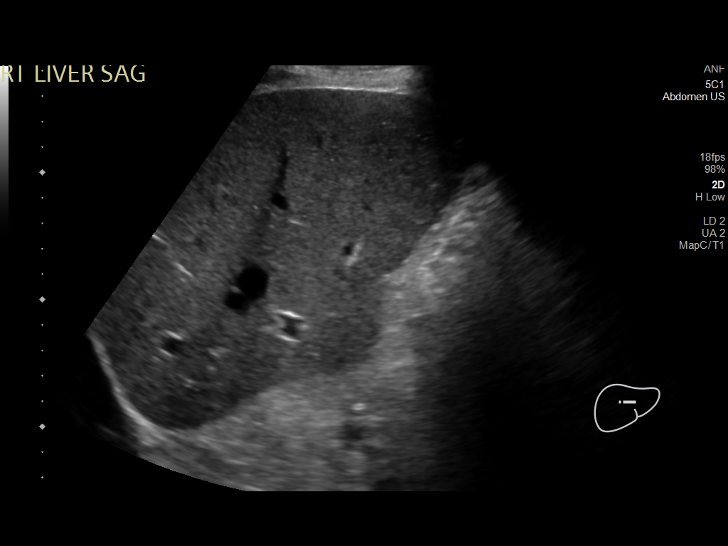
[im 25/37]
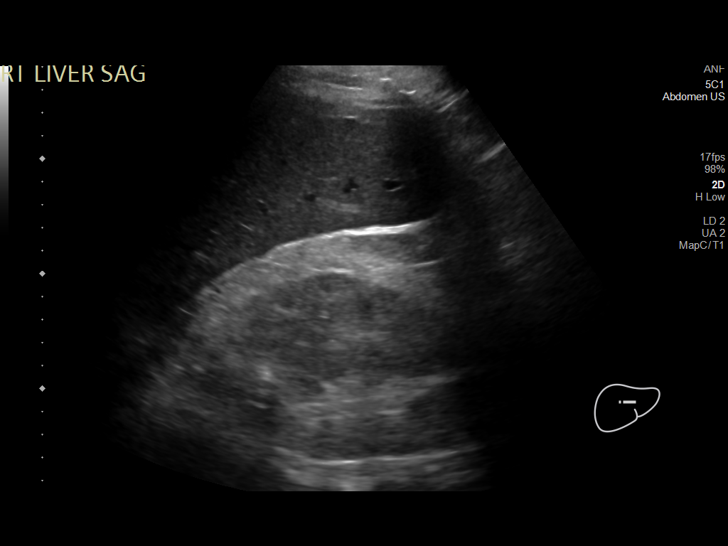
[im 28/37]
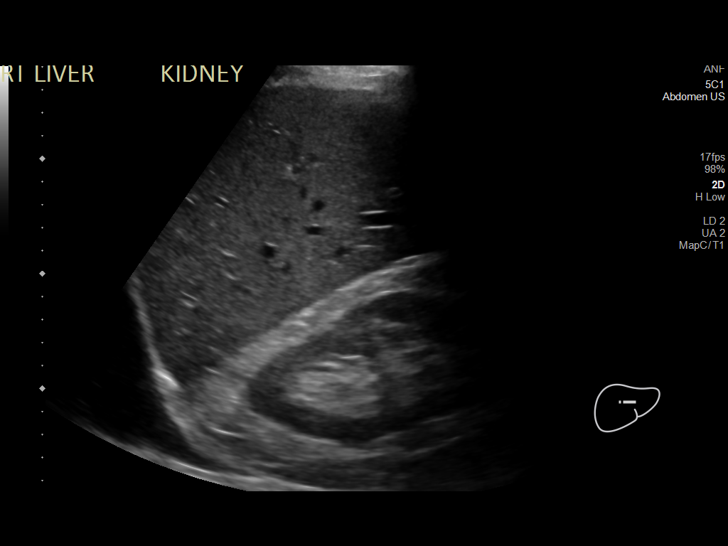
[im 31/37]
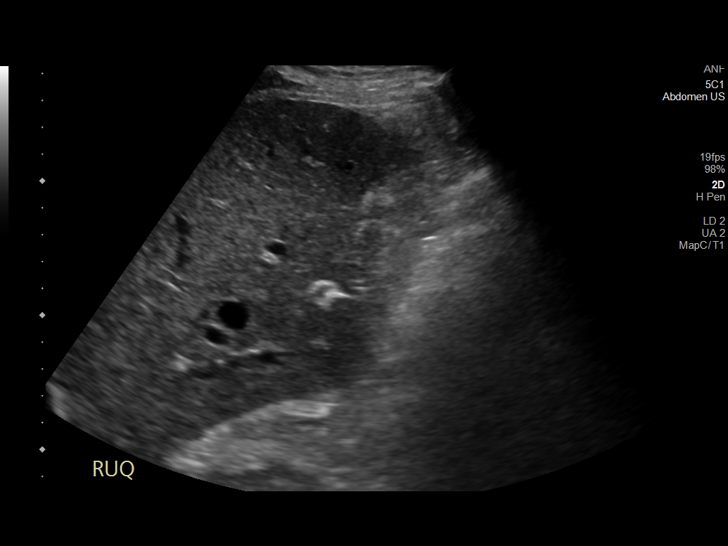
[im 34/37]
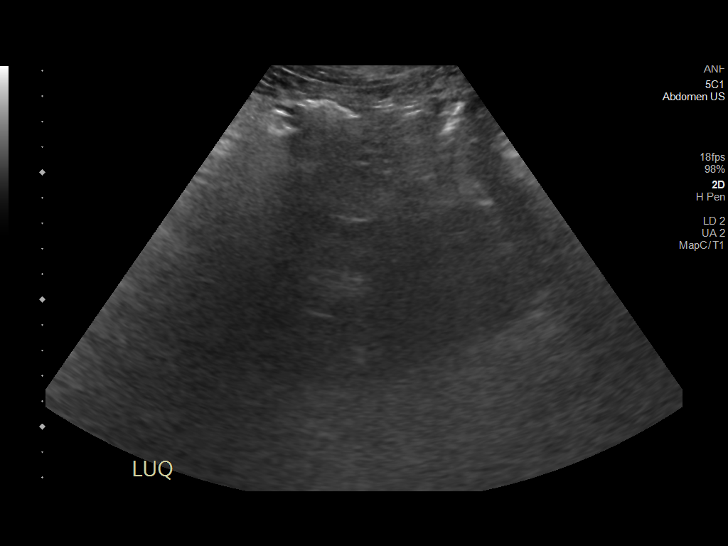
[im 37/37]
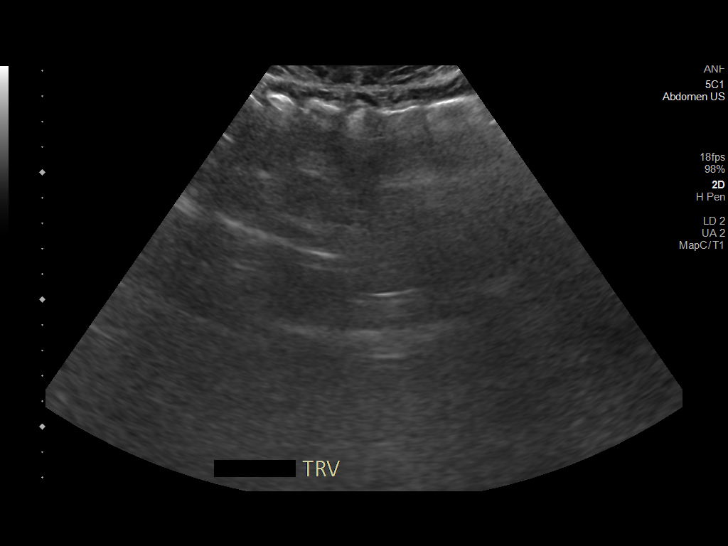

[14 of 25 positions shown; findings below may reference images not displayed]

FINDINGS: Gallbladder:

The gallbladder is surgically absent.

Common bile duct:

Diameter: 2.3 mm

Liver:

No focal lesion identified. Within normal limits in parenchymal
echogenicity. Portal vein is patent on color Doppler imaging with
normal direction of blood flow towards the liver.

Other: No intra-abdominal free fluid is seen.

A right pleural effusion is noted.
IMPRESSION: 1. Findings consistent with prior cholecystectomy.
2. Right pleural effusion.
3. No evidence of ascites.

## 2021-09-15 DIAGNOSIS — Z6829 Body mass index (BMI) 29.0-29.9, adult: Secondary | ICD-10-CM | POA: Diagnosis not present

## 2021-09-15 DIAGNOSIS — R197 Diarrhea, unspecified: Secondary | ICD-10-CM | POA: Diagnosis not present

## 2021-09-15 DIAGNOSIS — R109 Unspecified abdominal pain: Secondary | ICD-10-CM | POA: Diagnosis not present

## 2021-09-15 DIAGNOSIS — I10 Essential (primary) hypertension: Secondary | ICD-10-CM | POA: Diagnosis not present

## 2021-10-13 IMAGING — CR DG CHEST 2V
2 series · 2 of 2 positions shown · non-contrast
Comparison: 11/05/2019 chest radiograph and prior.

CLINICAL DATA: Dyspnea

EXAM:
CHEST - 2 VIEW

[chest pa]
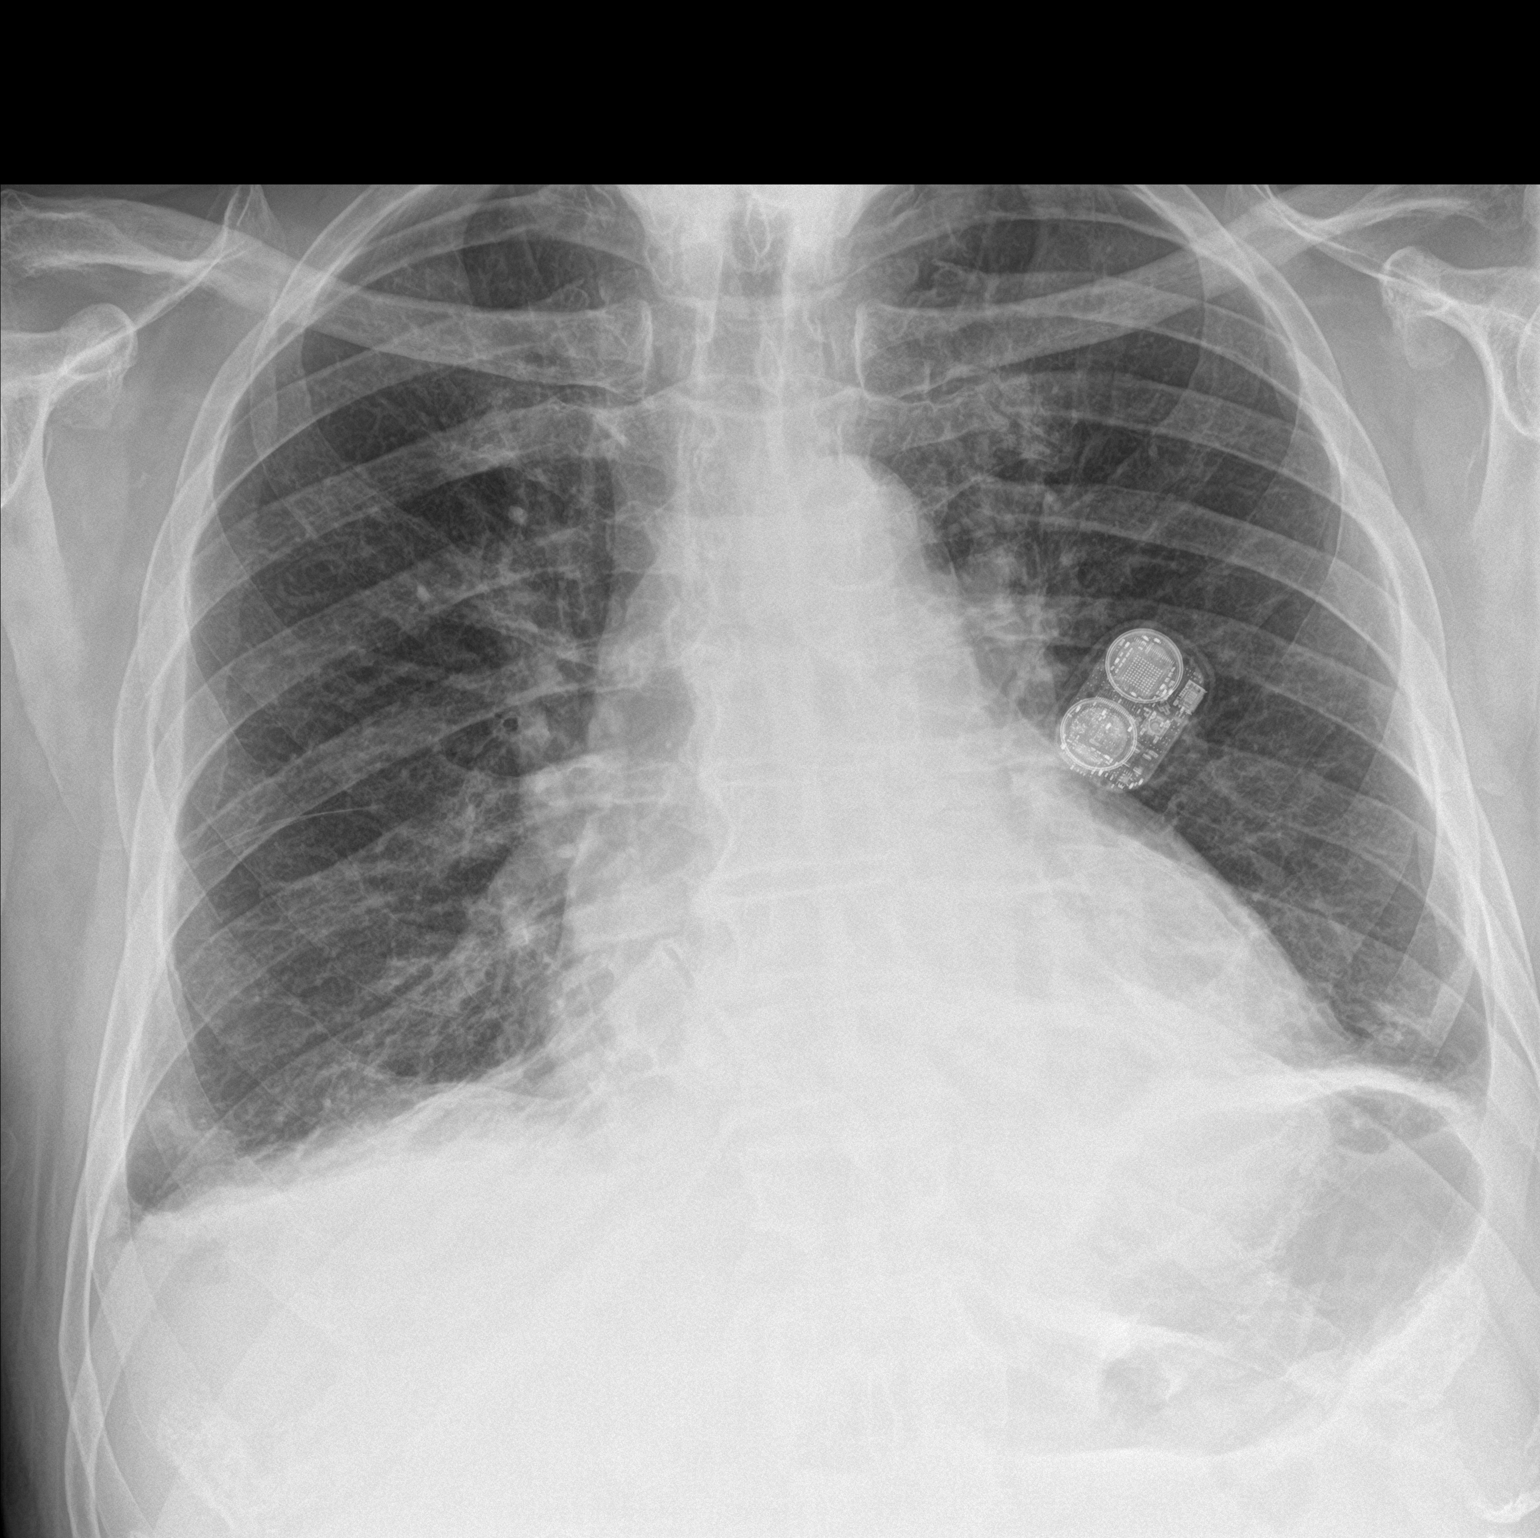

[chest lat]
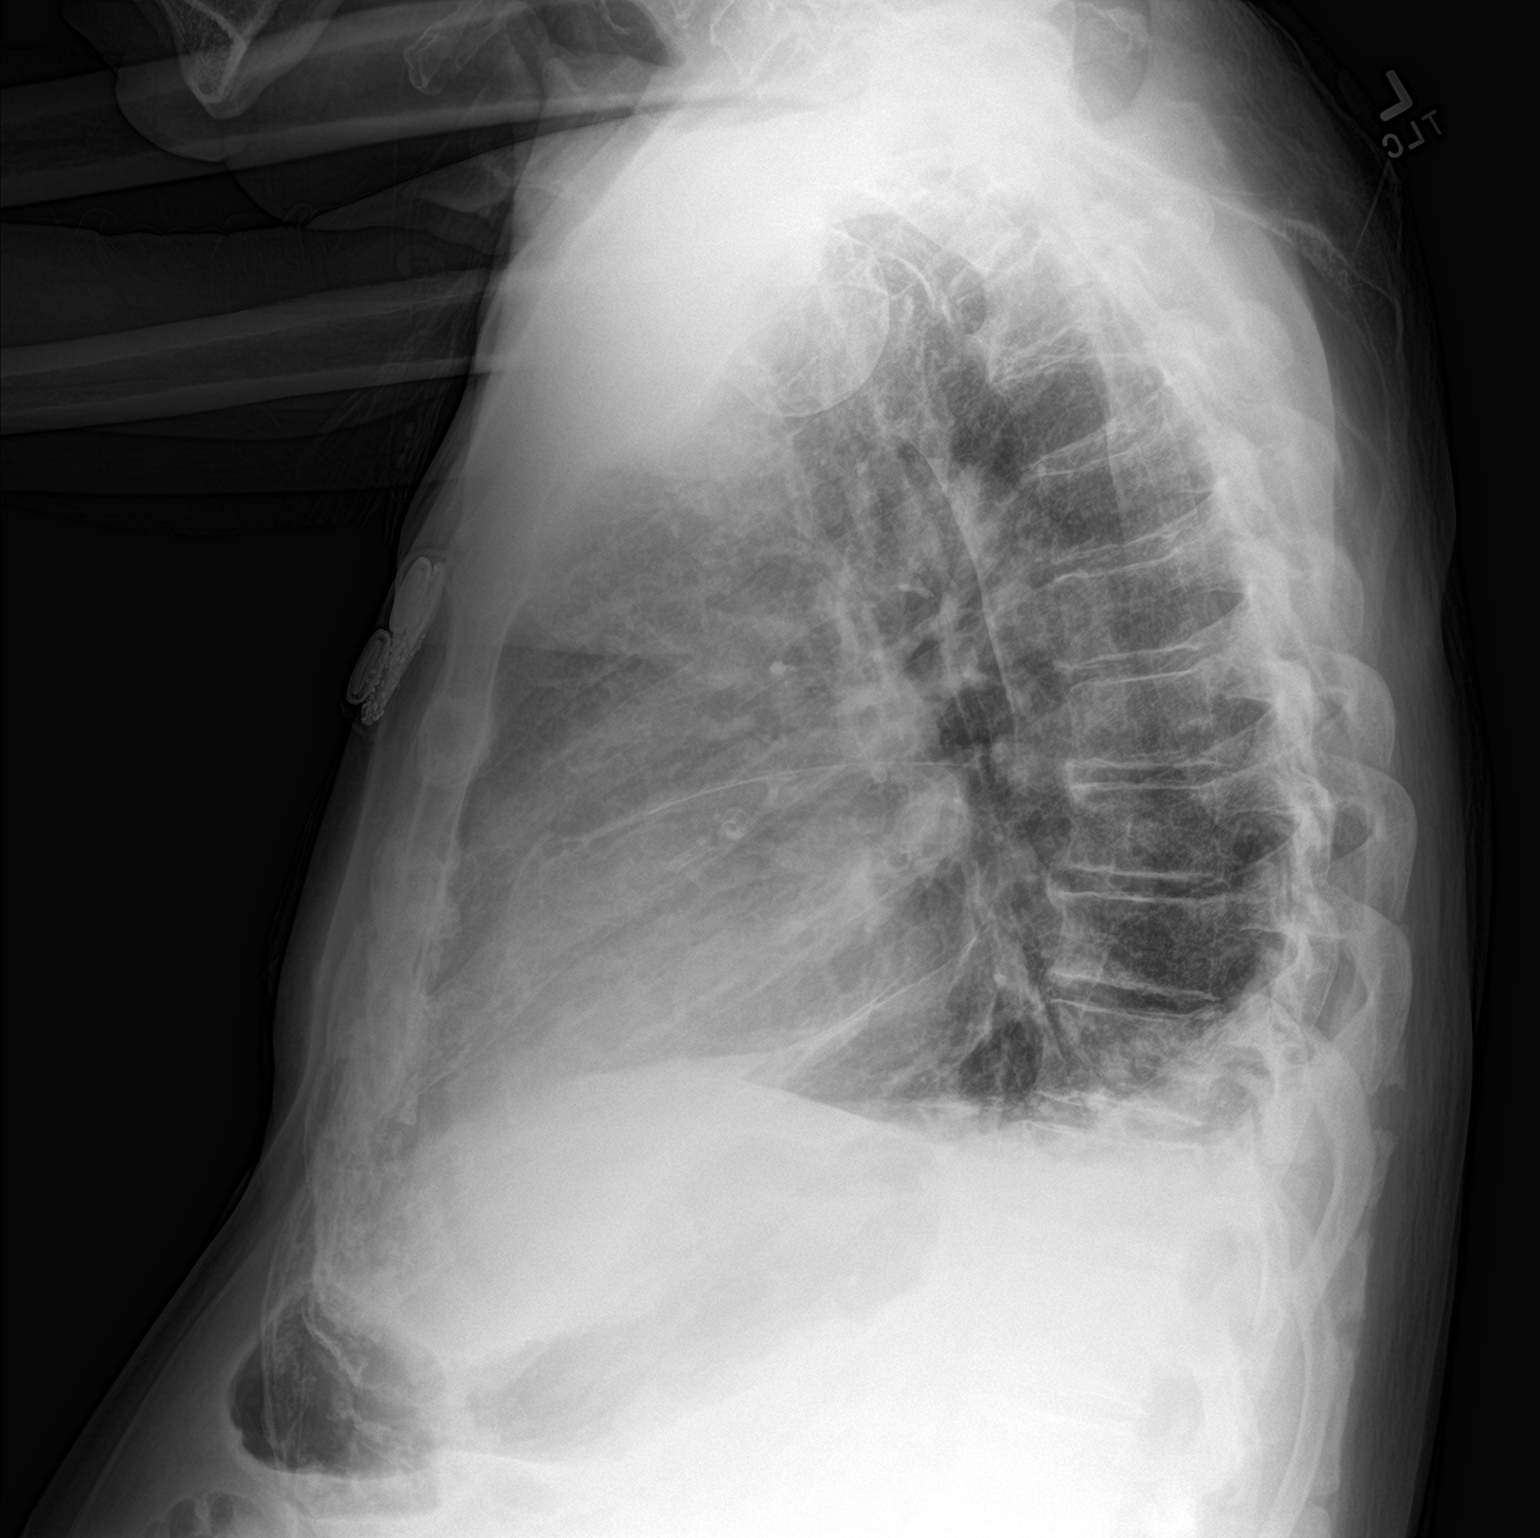

[2 of 2 positions shown; findings below may reference images not displayed]

FINDINGS: Cardiomegaly. Bibasilar atelectasis. No interstitial edema. Trace
bilateral pleural effusions. No pneumothorax. Multilevel
spondylosis.
IMPRESSION: Bibasilar atelectasis and trace pleural effusions.  Cardiomegaly.

Prior interstitial edema and central pulmonary vessel engorgement
has improved.

## 2021-10-15 DIAGNOSIS — E7800 Pure hypercholesterolemia, unspecified: Secondary | ICD-10-CM | POA: Diagnosis not present

## 2021-10-15 DIAGNOSIS — E782 Mixed hyperlipidemia: Secondary | ICD-10-CM | POA: Diagnosis not present

## 2021-10-15 DIAGNOSIS — I509 Heart failure, unspecified: Secondary | ICD-10-CM | POA: Diagnosis not present

## 2021-10-15 DIAGNOSIS — E7849 Other hyperlipidemia: Secondary | ICD-10-CM | POA: Diagnosis not present

## 2021-10-15 DIAGNOSIS — E7801 Familial hypercholesterolemia: Secondary | ICD-10-CM | POA: Diagnosis not present

## 2021-10-15 DIAGNOSIS — I10 Essential (primary) hypertension: Secondary | ICD-10-CM | POA: Diagnosis not present

## 2021-10-21 DIAGNOSIS — I10 Essential (primary) hypertension: Secondary | ICD-10-CM | POA: Diagnosis not present

## 2021-10-21 DIAGNOSIS — R233 Spontaneous ecchymoses: Secondary | ICD-10-CM | POA: Diagnosis not present

## 2021-10-21 DIAGNOSIS — Z0001 Encounter for general adult medical examination with abnormal findings: Secondary | ICD-10-CM | POA: Diagnosis not present

## 2021-10-21 DIAGNOSIS — I447 Left bundle-branch block, unspecified: Secondary | ICD-10-CM | POA: Diagnosis not present

## 2021-10-21 DIAGNOSIS — I2510 Atherosclerotic heart disease of native coronary artery without angina pectoris: Secondary | ICD-10-CM | POA: Diagnosis not present

## 2021-10-21 DIAGNOSIS — E7849 Other hyperlipidemia: Secondary | ICD-10-CM | POA: Diagnosis not present

## 2021-10-21 DIAGNOSIS — I509 Heart failure, unspecified: Secondary | ICD-10-CM | POA: Diagnosis not present

## 2021-10-21 DIAGNOSIS — Z6829 Body mass index (BMI) 29.0-29.9, adult: Secondary | ICD-10-CM | POA: Diagnosis not present

## 2021-10-21 DIAGNOSIS — R7301 Impaired fasting glucose: Secondary | ICD-10-CM | POA: Diagnosis not present

## 2021-12-21 DIAGNOSIS — Z6828 Body mass index (BMI) 28.0-28.9, adult: Secondary | ICD-10-CM | POA: Diagnosis not present

## 2021-12-21 DIAGNOSIS — A084 Viral intestinal infection, unspecified: Secondary | ICD-10-CM | POA: Diagnosis not present

## 2021-12-21 DIAGNOSIS — Z20828 Contact with and (suspected) exposure to other viral communicable diseases: Secondary | ICD-10-CM | POA: Diagnosis not present

## 2021-12-28 DIAGNOSIS — B0050 Herpesviral ocular disease, unspecified: Secondary | ICD-10-CM | POA: Diagnosis not present

## 2021-12-31 DIAGNOSIS — B0050 Herpesviral ocular disease, unspecified: Secondary | ICD-10-CM | POA: Diagnosis not present

## 2022-01-11 DIAGNOSIS — B0050 Herpesviral ocular disease, unspecified: Secondary | ICD-10-CM | POA: Diagnosis not present

## 2022-02-16 DIAGNOSIS — I10 Essential (primary) hypertension: Secondary | ICD-10-CM | POA: Diagnosis not present

## 2022-02-16 DIAGNOSIS — M10022 Idiopathic gout, left elbow: Secondary | ICD-10-CM | POA: Diagnosis not present

## 2022-02-16 DIAGNOSIS — R5382 Chronic fatigue, unspecified: Secondary | ICD-10-CM | POA: Diagnosis not present

## 2022-02-16 DIAGNOSIS — D649 Anemia, unspecified: Secondary | ICD-10-CM | POA: Diagnosis not present

## 2022-02-16 DIAGNOSIS — E1165 Type 2 diabetes mellitus with hyperglycemia: Secondary | ICD-10-CM | POA: Diagnosis not present

## 2022-02-16 DIAGNOSIS — E7849 Other hyperlipidemia: Secondary | ICD-10-CM | POA: Diagnosis not present

## 2022-02-23 DIAGNOSIS — Z6829 Body mass index (BMI) 29.0-29.9, adult: Secondary | ICD-10-CM | POA: Diagnosis not present

## 2022-02-23 DIAGNOSIS — I5022 Chronic systolic (congestive) heart failure: Secondary | ICD-10-CM | POA: Diagnosis not present

## 2022-02-23 DIAGNOSIS — Z0001 Encounter for general adult medical examination with abnormal findings: Secondary | ICD-10-CM | POA: Diagnosis not present

## 2022-02-23 DIAGNOSIS — I509 Heart failure, unspecified: Secondary | ICD-10-CM | POA: Diagnosis not present

## 2022-02-23 DIAGNOSIS — N1831 Chronic kidney disease, stage 3a: Secondary | ICD-10-CM | POA: Diagnosis not present

## 2022-02-23 DIAGNOSIS — I2510 Atherosclerotic heart disease of native coronary artery without angina pectoris: Secondary | ICD-10-CM | POA: Diagnosis not present

## 2022-02-23 DIAGNOSIS — E1165 Type 2 diabetes mellitus with hyperglycemia: Secondary | ICD-10-CM | POA: Diagnosis not present

## 2022-02-23 DIAGNOSIS — R030 Elevated blood-pressure reading, without diagnosis of hypertension: Secondary | ICD-10-CM | POA: Diagnosis not present

## 2022-02-23 DIAGNOSIS — E7849 Other hyperlipidemia: Secondary | ICD-10-CM | POA: Diagnosis not present

## 2022-03-09 DIAGNOSIS — K219 Gastro-esophageal reflux disease without esophagitis: Secondary | ICD-10-CM | POA: Diagnosis not present

## 2022-03-09 DIAGNOSIS — I447 Left bundle-branch block, unspecified: Secondary | ICD-10-CM | POA: Diagnosis not present

## 2022-03-09 DIAGNOSIS — I259 Chronic ischemic heart disease, unspecified: Secondary | ICD-10-CM | POA: Diagnosis not present

## 2022-03-09 DIAGNOSIS — Z955 Presence of coronary angioplasty implant and graft: Secondary | ICD-10-CM | POA: Diagnosis not present

## 2022-03-09 DIAGNOSIS — I10 Essential (primary) hypertension: Secondary | ICD-10-CM | POA: Diagnosis not present

## 2022-03-09 DIAGNOSIS — I2510 Atherosclerotic heart disease of native coronary artery without angina pectoris: Secondary | ICD-10-CM | POA: Diagnosis not present

## 2022-06-30 DIAGNOSIS — E7849 Other hyperlipidemia: Secondary | ICD-10-CM | POA: Diagnosis not present

## 2022-06-30 DIAGNOSIS — N1831 Chronic kidney disease, stage 3a: Secondary | ICD-10-CM | POA: Diagnosis not present

## 2022-06-30 DIAGNOSIS — I10 Essential (primary) hypertension: Secondary | ICD-10-CM | POA: Diagnosis not present

## 2022-06-30 DIAGNOSIS — I5020 Unspecified systolic (congestive) heart failure: Secondary | ICD-10-CM | POA: Diagnosis not present

## 2022-06-30 DIAGNOSIS — Z6830 Body mass index (BMI) 30.0-30.9, adult: Secondary | ICD-10-CM | POA: Diagnosis not present

## 2022-06-30 DIAGNOSIS — E782 Mixed hyperlipidemia: Secondary | ICD-10-CM | POA: Diagnosis not present

## 2022-06-30 DIAGNOSIS — E1165 Type 2 diabetes mellitus with hyperglycemia: Secondary | ICD-10-CM | POA: Diagnosis not present

## 2022-06-30 DIAGNOSIS — I2510 Atherosclerotic heart disease of native coronary artery without angina pectoris: Secondary | ICD-10-CM | POA: Diagnosis not present

## 2022-10-01 DIAGNOSIS — E782 Mixed hyperlipidemia: Secondary | ICD-10-CM | POA: Diagnosis not present

## 2022-10-01 DIAGNOSIS — E1165 Type 2 diabetes mellitus with hyperglycemia: Secondary | ICD-10-CM | POA: Diagnosis not present

## 2022-10-01 DIAGNOSIS — I10 Essential (primary) hypertension: Secondary | ICD-10-CM | POA: Diagnosis not present

## 2022-10-08 DIAGNOSIS — E7801 Familial hypercholesterolemia: Secondary | ICD-10-CM | POA: Diagnosis not present

## 2022-10-08 DIAGNOSIS — E7849 Other hyperlipidemia: Secondary | ICD-10-CM | POA: Diagnosis not present

## 2022-10-08 DIAGNOSIS — E1165 Type 2 diabetes mellitus with hyperglycemia: Secondary | ICD-10-CM | POA: Diagnosis not present

## 2022-10-08 DIAGNOSIS — N1831 Chronic kidney disease, stage 3a: Secondary | ICD-10-CM | POA: Diagnosis not present

## 2022-10-20 DIAGNOSIS — I2510 Atherosclerotic heart disease of native coronary artery without angina pectoris: Secondary | ICD-10-CM | POA: Diagnosis not present

## 2022-10-20 DIAGNOSIS — Z6829 Body mass index (BMI) 29.0-29.9, adult: Secondary | ICD-10-CM | POA: Diagnosis not present

## 2022-10-20 DIAGNOSIS — Z23 Encounter for immunization: Secondary | ICD-10-CM | POA: Diagnosis not present

## 2022-10-20 DIAGNOSIS — E7849 Other hyperlipidemia: Secondary | ICD-10-CM | POA: Diagnosis not present

## 2022-10-20 DIAGNOSIS — R030 Elevated blood-pressure reading, without diagnosis of hypertension: Secondary | ICD-10-CM | POA: Diagnosis not present

## 2022-10-20 DIAGNOSIS — N1831 Chronic kidney disease, stage 3a: Secondary | ICD-10-CM | POA: Diagnosis not present

## 2022-10-20 DIAGNOSIS — E1165 Type 2 diabetes mellitus with hyperglycemia: Secondary | ICD-10-CM | POA: Diagnosis not present

## 2022-10-20 DIAGNOSIS — I5020 Unspecified systolic (congestive) heart failure: Secondary | ICD-10-CM | POA: Diagnosis not present

## 2022-10-25 DIAGNOSIS — K921 Melena: Secondary | ICD-10-CM | POA: Diagnosis not present

## 2022-10-25 DIAGNOSIS — K5730 Diverticulosis of large intestine without perforation or abscess without bleeding: Secondary | ICD-10-CM | POA: Diagnosis not present

## 2022-10-25 DIAGNOSIS — Z6830 Body mass index (BMI) 30.0-30.9, adult: Secondary | ICD-10-CM | POA: Diagnosis not present

## 2022-10-25 DIAGNOSIS — R1032 Left lower quadrant pain: Secondary | ICD-10-CM | POA: Diagnosis not present

## 2022-10-25 DIAGNOSIS — I5022 Chronic systolic (congestive) heart failure: Secondary | ICD-10-CM | POA: Diagnosis not present

## 2022-10-25 DIAGNOSIS — N183 Chronic kidney disease, stage 3 unspecified: Secondary | ICD-10-CM | POA: Diagnosis not present

## 2022-10-25 DIAGNOSIS — D62 Acute posthemorrhagic anemia: Secondary | ICD-10-CM | POA: Diagnosis not present

## 2022-10-25 DIAGNOSIS — I959 Hypotension, unspecified: Secondary | ICD-10-CM | POA: Diagnosis not present

## 2022-10-25 DIAGNOSIS — I472 Ventricular tachycardia, unspecified: Secondary | ICD-10-CM | POA: Diagnosis not present

## 2022-10-25 DIAGNOSIS — E1122 Type 2 diabetes mellitus with diabetic chronic kidney disease: Secondary | ICD-10-CM | POA: Diagnosis not present

## 2022-10-25 DIAGNOSIS — I130 Hypertensive heart and chronic kidney disease with heart failure and stage 1 through stage 4 chronic kidney disease, or unspecified chronic kidney disease: Secondary | ICD-10-CM | POA: Diagnosis not present

## 2022-10-25 DIAGNOSIS — K5731 Diverticulosis of large intestine without perforation or abscess with bleeding: Secondary | ICD-10-CM | POA: Diagnosis not present

## 2022-10-25 DIAGNOSIS — K922 Gastrointestinal hemorrhage, unspecified: Secondary | ICD-10-CM | POA: Diagnosis not present

## 2022-10-25 DIAGNOSIS — I440 Atrioventricular block, first degree: Secondary | ICD-10-CM | POA: Diagnosis not present

## 2022-10-25 DIAGNOSIS — N179 Acute kidney failure, unspecified: Secondary | ICD-10-CM | POA: Diagnosis not present

## 2022-11-02 DIAGNOSIS — R5382 Chronic fatigue, unspecified: Secondary | ICD-10-CM | POA: Diagnosis not present

## 2022-11-02 DIAGNOSIS — D649 Anemia, unspecified: Secondary | ICD-10-CM | POA: Diagnosis not present

## 2022-11-09 DIAGNOSIS — K922 Gastrointestinal hemorrhage, unspecified: Secondary | ICD-10-CM | POA: Diagnosis not present

## 2022-11-09 DIAGNOSIS — D62 Acute posthemorrhagic anemia: Secondary | ICD-10-CM | POA: Diagnosis not present

## 2022-11-09 DIAGNOSIS — I255 Ischemic cardiomyopathy: Secondary | ICD-10-CM | POA: Diagnosis not present

## 2022-11-09 DIAGNOSIS — I2510 Atherosclerotic heart disease of native coronary artery without angina pectoris: Secondary | ICD-10-CM | POA: Diagnosis not present

## 2022-11-16 DIAGNOSIS — D649 Anemia, unspecified: Secondary | ICD-10-CM | POA: Diagnosis not present

## 2022-11-16 DIAGNOSIS — R5383 Other fatigue: Secondary | ICD-10-CM | POA: Diagnosis not present

## 2022-11-30 DIAGNOSIS — K922 Gastrointestinal hemorrhage, unspecified: Secondary | ICD-10-CM | POA: Diagnosis not present

## 2022-11-30 DIAGNOSIS — I255 Ischemic cardiomyopathy: Secondary | ICD-10-CM | POA: Diagnosis not present

## 2022-11-30 DIAGNOSIS — D62 Acute posthemorrhagic anemia: Secondary | ICD-10-CM | POA: Diagnosis not present

## 2022-11-30 DIAGNOSIS — I2510 Atherosclerotic heart disease of native coronary artery without angina pectoris: Secondary | ICD-10-CM | POA: Diagnosis not present

## 2022-12-09 DIAGNOSIS — I2510 Atherosclerotic heart disease of native coronary artery without angina pectoris: Secondary | ICD-10-CM | POA: Diagnosis not present

## 2022-12-09 DIAGNOSIS — R030 Elevated blood-pressure reading, without diagnosis of hypertension: Secondary | ICD-10-CM | POA: Diagnosis not present

## 2022-12-09 DIAGNOSIS — M7021 Olecranon bursitis, right elbow: Secondary | ICD-10-CM | POA: Diagnosis not present

## 2022-12-09 DIAGNOSIS — Z6829 Body mass index (BMI) 29.0-29.9, adult: Secondary | ICD-10-CM | POA: Diagnosis not present

## 2022-12-31 DIAGNOSIS — I2510 Atherosclerotic heart disease of native coronary artery without angina pectoris: Secondary | ICD-10-CM | POA: Diagnosis not present

## 2022-12-31 DIAGNOSIS — E782 Mixed hyperlipidemia: Secondary | ICD-10-CM | POA: Diagnosis not present

## 2022-12-31 DIAGNOSIS — E1165 Type 2 diabetes mellitus with hyperglycemia: Secondary | ICD-10-CM | POA: Diagnosis not present

## 2023-02-14 DIAGNOSIS — E1165 Type 2 diabetes mellitus with hyperglycemia: Secondary | ICD-10-CM | POA: Diagnosis not present

## 2023-02-14 DIAGNOSIS — E7849 Other hyperlipidemia: Secondary | ICD-10-CM | POA: Diagnosis not present

## 2023-02-14 DIAGNOSIS — E7801 Familial hypercholesterolemia: Secondary | ICD-10-CM | POA: Diagnosis not present

## 2023-02-17 DIAGNOSIS — E7849 Other hyperlipidemia: Secondary | ICD-10-CM | POA: Diagnosis not present

## 2023-02-17 DIAGNOSIS — Z0001 Encounter for general adult medical examination with abnormal findings: Secondary | ICD-10-CM | POA: Diagnosis not present

## 2023-02-17 DIAGNOSIS — I5020 Unspecified systolic (congestive) heart failure: Secondary | ICD-10-CM | POA: Diagnosis not present

## 2023-02-17 DIAGNOSIS — R55 Syncope and collapse: Secondary | ICD-10-CM | POA: Diagnosis not present

## 2023-02-17 DIAGNOSIS — E1165 Type 2 diabetes mellitus with hyperglycemia: Secondary | ICD-10-CM | POA: Diagnosis not present

## 2023-02-17 DIAGNOSIS — I2510 Atherosclerotic heart disease of native coronary artery without angina pectoris: Secondary | ICD-10-CM | POA: Diagnosis not present

## 2023-02-17 DIAGNOSIS — R5383 Other fatigue: Secondary | ICD-10-CM | POA: Diagnosis not present

## 2023-02-17 DIAGNOSIS — N1831 Chronic kidney disease, stage 3a: Secondary | ICD-10-CM | POA: Diagnosis not present

## 2023-02-17 DIAGNOSIS — I4891 Unspecified atrial fibrillation: Secondary | ICD-10-CM | POA: Diagnosis not present

## 2023-02-24 DIAGNOSIS — I2510 Atherosclerotic heart disease of native coronary artery without angina pectoris: Secondary | ICD-10-CM | POA: Diagnosis not present

## 2023-02-24 DIAGNOSIS — Z951 Presence of aortocoronary bypass graft: Secondary | ICD-10-CM | POA: Diagnosis not present

## 2023-02-24 DIAGNOSIS — I130 Hypertensive heart and chronic kidney disease with heart failure and stage 1 through stage 4 chronic kidney disease, or unspecified chronic kidney disease: Secondary | ICD-10-CM | POA: Diagnosis not present

## 2023-02-24 DIAGNOSIS — I340 Nonrheumatic mitral (valve) insufficiency: Secondary | ICD-10-CM | POA: Diagnosis not present

## 2023-02-24 DIAGNOSIS — N183 Chronic kidney disease, stage 3 unspecified: Secondary | ICD-10-CM | POA: Diagnosis not present

## 2023-02-24 DIAGNOSIS — Z955 Presence of coronary angioplasty implant and graft: Secondary | ICD-10-CM | POA: Diagnosis not present

## 2023-02-24 DIAGNOSIS — I341 Nonrheumatic mitral (valve) prolapse: Secondary | ICD-10-CM | POA: Diagnosis not present

## 2023-02-24 DIAGNOSIS — I2582 Chronic total occlusion of coronary artery: Secondary | ICD-10-CM | POA: Diagnosis not present

## 2023-02-24 DIAGNOSIS — I5022 Chronic systolic (congestive) heart failure: Secondary | ICD-10-CM | POA: Diagnosis not present

## 2023-03-18 DIAGNOSIS — I517 Cardiomegaly: Secondary | ICD-10-CM | POA: Diagnosis not present

## 2023-03-18 DIAGNOSIS — I340 Nonrheumatic mitral (valve) insufficiency: Secondary | ICD-10-CM | POA: Diagnosis not present

## 2023-03-18 DIAGNOSIS — I341 Nonrheumatic mitral (valve) prolapse: Secondary | ICD-10-CM | POA: Diagnosis not present

## 2023-03-18 DIAGNOSIS — I77810 Thoracic aortic ectasia: Secondary | ICD-10-CM | POA: Diagnosis not present

## 2023-04-04 DIAGNOSIS — Z951 Presence of aortocoronary bypass graft: Secondary | ICD-10-CM | POA: Diagnosis not present

## 2023-04-04 DIAGNOSIS — I5022 Chronic systolic (congestive) heart failure: Secondary | ICD-10-CM | POA: Diagnosis not present

## 2023-04-04 DIAGNOSIS — E785 Hyperlipidemia, unspecified: Secondary | ICD-10-CM | POA: Diagnosis not present

## 2023-04-04 DIAGNOSIS — I2510 Atherosclerotic heart disease of native coronary artery without angina pectoris: Secondary | ICD-10-CM | POA: Diagnosis not present

## 2023-04-04 DIAGNOSIS — I340 Nonrheumatic mitral (valve) insufficiency: Secondary | ICD-10-CM | POA: Diagnosis not present

## 2023-04-04 DIAGNOSIS — I341 Nonrheumatic mitral (valve) prolapse: Secondary | ICD-10-CM | POA: Diagnosis not present

## 2023-04-04 DIAGNOSIS — Z955 Presence of coronary angioplasty implant and graft: Secondary | ICD-10-CM | POA: Diagnosis not present

## 2023-04-04 DIAGNOSIS — I480 Paroxysmal atrial fibrillation: Secondary | ICD-10-CM | POA: Diagnosis not present

## 2023-04-04 DIAGNOSIS — Z7984 Long term (current) use of oral hypoglycemic drugs: Secondary | ICD-10-CM | POA: Diagnosis not present

## 2023-04-04 DIAGNOSIS — E1159 Type 2 diabetes mellitus with other circulatory complications: Secondary | ICD-10-CM | POA: Diagnosis not present

## 2023-04-04 DIAGNOSIS — I110 Hypertensive heart disease with heart failure: Secondary | ICD-10-CM | POA: Diagnosis not present

## 2023-04-04 DIAGNOSIS — M1990 Unspecified osteoarthritis, unspecified site: Secondary | ICD-10-CM | POA: Diagnosis not present

## 2023-04-07 DIAGNOSIS — I259 Chronic ischemic heart disease, unspecified: Secondary | ICD-10-CM | POA: Diagnosis not present

## 2023-04-07 DIAGNOSIS — I4891 Unspecified atrial fibrillation: Secondary | ICD-10-CM | POA: Diagnosis not present

## 2023-04-08 DIAGNOSIS — I493 Ventricular premature depolarization: Secondary | ICD-10-CM | POA: Diagnosis not present

## 2023-04-08 DIAGNOSIS — I447 Left bundle-branch block, unspecified: Secondary | ICD-10-CM | POA: Diagnosis not present

## 2023-04-08 DIAGNOSIS — I4891 Unspecified atrial fibrillation: Secondary | ICD-10-CM | POA: Diagnosis not present

## 2023-05-31 DIAGNOSIS — I480 Paroxysmal atrial fibrillation: Secondary | ICD-10-CM | POA: Diagnosis not present

## 2023-06-03 DIAGNOSIS — I447 Left bundle-branch block, unspecified: Secondary | ICD-10-CM | POA: Diagnosis not present

## 2023-06-03 DIAGNOSIS — I499 Cardiac arrhythmia, unspecified: Secondary | ICD-10-CM | POA: Diagnosis not present

## 2023-06-03 DIAGNOSIS — I4891 Unspecified atrial fibrillation: Secondary | ICD-10-CM | POA: Diagnosis not present

## 2023-06-24 DIAGNOSIS — N1831 Chronic kidney disease, stage 3a: Secondary | ICD-10-CM | POA: Diagnosis not present

## 2023-06-24 DIAGNOSIS — E1165 Type 2 diabetes mellitus with hyperglycemia: Secondary | ICD-10-CM | POA: Diagnosis not present

## 2023-06-24 DIAGNOSIS — R5382 Chronic fatigue, unspecified: Secondary | ICD-10-CM | POA: Diagnosis not present

## 2023-06-24 DIAGNOSIS — E782 Mixed hyperlipidemia: Secondary | ICD-10-CM | POA: Diagnosis not present

## 2023-06-24 DIAGNOSIS — E7849 Other hyperlipidemia: Secondary | ICD-10-CM | POA: Diagnosis not present

## 2023-06-24 DIAGNOSIS — I10 Essential (primary) hypertension: Secondary | ICD-10-CM | POA: Diagnosis not present

## 2023-06-29 DIAGNOSIS — N1831 Chronic kidney disease, stage 3a: Secondary | ICD-10-CM | POA: Diagnosis not present

## 2023-06-29 DIAGNOSIS — R5382 Chronic fatigue, unspecified: Secondary | ICD-10-CM | POA: Diagnosis not present

## 2023-06-29 DIAGNOSIS — Z23 Encounter for immunization: Secondary | ICD-10-CM | POA: Diagnosis not present

## 2023-06-29 DIAGNOSIS — E782 Mixed hyperlipidemia: Secondary | ICD-10-CM | POA: Diagnosis not present

## 2023-06-29 DIAGNOSIS — I482 Chronic atrial fibrillation, unspecified: Secondary | ICD-10-CM | POA: Diagnosis not present

## 2023-06-29 DIAGNOSIS — Z6829 Body mass index (BMI) 29.0-29.9, adult: Secondary | ICD-10-CM | POA: Diagnosis not present

## 2023-08-30 DIAGNOSIS — I480 Paroxysmal atrial fibrillation: Secondary | ICD-10-CM | POA: Diagnosis not present

## 2023-08-31 DIAGNOSIS — I4891 Unspecified atrial fibrillation: Secondary | ICD-10-CM | POA: Diagnosis not present

## 2023-08-31 DIAGNOSIS — I493 Ventricular premature depolarization: Secondary | ICD-10-CM | POA: Diagnosis not present

## 2023-08-31 DIAGNOSIS — I454 Nonspecific intraventricular block: Secondary | ICD-10-CM | POA: Diagnosis not present

## 2023-09-05 DIAGNOSIS — Z951 Presence of aortocoronary bypass graft: Secondary | ICD-10-CM | POA: Diagnosis not present

## 2023-09-05 DIAGNOSIS — I4891 Unspecified atrial fibrillation: Secondary | ICD-10-CM | POA: Diagnosis not present

## 2023-09-05 DIAGNOSIS — I2510 Atherosclerotic heart disease of native coronary artery without angina pectoris: Secondary | ICD-10-CM | POA: Diagnosis not present

## 2023-09-05 DIAGNOSIS — Z7901 Long term (current) use of anticoagulants: Secondary | ICD-10-CM | POA: Diagnosis not present

## 2023-09-05 DIAGNOSIS — I480 Paroxysmal atrial fibrillation: Secondary | ICD-10-CM | POA: Diagnosis not present

## 2023-09-05 DIAGNOSIS — I4819 Other persistent atrial fibrillation: Secondary | ICD-10-CM | POA: Diagnosis not present

## 2023-09-05 DIAGNOSIS — Z79899 Other long term (current) drug therapy: Secondary | ICD-10-CM | POA: Diagnosis not present

## 2023-09-05 DIAGNOSIS — I340 Nonrheumatic mitral (valve) insufficiency: Secondary | ICD-10-CM | POA: Diagnosis not present

## 2023-09-05 DIAGNOSIS — I2582 Chronic total occlusion of coronary artery: Secondary | ICD-10-CM | POA: Diagnosis not present

## 2023-09-05 DIAGNOSIS — I341 Nonrheumatic mitral (valve) prolapse: Secondary | ICD-10-CM | POA: Diagnosis not present

## 2023-10-04 DIAGNOSIS — I340 Nonrheumatic mitral (valve) insufficiency: Secondary | ICD-10-CM | POA: Diagnosis not present

## 2023-10-04 DIAGNOSIS — I480 Paroxysmal atrial fibrillation: Secondary | ICD-10-CM | POA: Diagnosis not present

## 2023-10-05 DIAGNOSIS — I454 Nonspecific intraventricular block: Secondary | ICD-10-CM | POA: Diagnosis not present

## 2023-10-05 DIAGNOSIS — I440 Atrioventricular block, first degree: Secondary | ICD-10-CM | POA: Diagnosis not present

## 2023-10-17 DIAGNOSIS — E782 Mixed hyperlipidemia: Secondary | ICD-10-CM | POA: Diagnosis not present

## 2023-10-17 DIAGNOSIS — E7849 Other hyperlipidemia: Secondary | ICD-10-CM | POA: Diagnosis not present

## 2023-10-17 DIAGNOSIS — Z1329 Encounter for screening for other suspected endocrine disorder: Secondary | ICD-10-CM | POA: Diagnosis not present

## 2023-10-17 DIAGNOSIS — N1831 Chronic kidney disease, stage 3a: Secondary | ICD-10-CM | POA: Diagnosis not present

## 2023-10-17 DIAGNOSIS — I10 Essential (primary) hypertension: Secondary | ICD-10-CM | POA: Diagnosis not present

## 2023-10-17 DIAGNOSIS — R5382 Chronic fatigue, unspecified: Secondary | ICD-10-CM | POA: Diagnosis not present

## 2023-10-17 DIAGNOSIS — N400 Enlarged prostate without lower urinary tract symptoms: Secondary | ICD-10-CM | POA: Diagnosis not present

## 2023-10-17 DIAGNOSIS — E1165 Type 2 diabetes mellitus with hyperglycemia: Secondary | ICD-10-CM | POA: Diagnosis not present

## 2023-10-24 DIAGNOSIS — N1831 Chronic kidney disease, stage 3a: Secondary | ICD-10-CM | POA: Diagnosis not present

## 2023-10-24 DIAGNOSIS — Z6828 Body mass index (BMI) 28.0-28.9, adult: Secondary | ICD-10-CM | POA: Diagnosis not present

## 2023-10-24 DIAGNOSIS — E1165 Type 2 diabetes mellitus with hyperglycemia: Secondary | ICD-10-CM | POA: Diagnosis not present

## 2023-10-24 DIAGNOSIS — I25119 Atherosclerotic heart disease of native coronary artery with unspecified angina pectoris: Secondary | ICD-10-CM | POA: Diagnosis not present

## 2023-10-24 DIAGNOSIS — I482 Chronic atrial fibrillation, unspecified: Secondary | ICD-10-CM | POA: Diagnosis not present

## 2024-01-05 DIAGNOSIS — I517 Cardiomegaly: Secondary | ICD-10-CM | POA: Diagnosis not present

## 2024-01-05 DIAGNOSIS — I4891 Unspecified atrial fibrillation: Secondary | ICD-10-CM | POA: Diagnosis not present

## 2024-01-05 DIAGNOSIS — I50810 Right heart failure, unspecified: Secondary | ICD-10-CM | POA: Diagnosis not present

## 2024-01-05 DIAGNOSIS — I341 Nonrheumatic mitral (valve) prolapse: Secondary | ICD-10-CM | POA: Diagnosis not present

## 2024-01-05 DIAGNOSIS — I480 Paroxysmal atrial fibrillation: Secondary | ICD-10-CM | POA: Diagnosis not present

## 2024-01-05 DIAGNOSIS — I454 Nonspecific intraventricular block: Secondary | ICD-10-CM | POA: Diagnosis not present

## 2024-01-05 DIAGNOSIS — I5020 Unspecified systolic (congestive) heart failure: Secondary | ICD-10-CM | POA: Diagnosis not present

## 2024-01-05 DIAGNOSIS — I340 Nonrheumatic mitral (valve) insufficiency: Secondary | ICD-10-CM | POA: Diagnosis not present

## 2024-02-17 DIAGNOSIS — E7849 Other hyperlipidemia: Secondary | ICD-10-CM | POA: Diagnosis not present

## 2024-02-17 DIAGNOSIS — N1831 Chronic kidney disease, stage 3a: Secondary | ICD-10-CM | POA: Diagnosis not present

## 2024-02-17 DIAGNOSIS — I5042 Chronic combined systolic (congestive) and diastolic (congestive) heart failure: Secondary | ICD-10-CM | POA: Diagnosis not present

## 2024-02-17 DIAGNOSIS — I480 Paroxysmal atrial fibrillation: Secondary | ICD-10-CM | POA: Diagnosis not present

## 2024-02-17 DIAGNOSIS — I4819 Other persistent atrial fibrillation: Secondary | ICD-10-CM | POA: Diagnosis not present

## 2024-02-17 DIAGNOSIS — E1165 Type 2 diabetes mellitus with hyperglycemia: Secondary | ICD-10-CM | POA: Diagnosis not present

## 2024-02-20 DIAGNOSIS — I447 Left bundle-branch block, unspecified: Secondary | ICD-10-CM | POA: Diagnosis not present

## 2024-02-20 DIAGNOSIS — I499 Cardiac arrhythmia, unspecified: Secondary | ICD-10-CM | POA: Diagnosis not present

## 2024-02-20 DIAGNOSIS — I4891 Unspecified atrial fibrillation: Secondary | ICD-10-CM | POA: Diagnosis not present

## 2024-02-23 DIAGNOSIS — Z1389 Encounter for screening for other disorder: Secondary | ICD-10-CM | POA: Diagnosis not present

## 2024-02-23 DIAGNOSIS — Z0001 Encounter for general adult medical examination with abnormal findings: Secondary | ICD-10-CM | POA: Diagnosis not present

## 2024-02-23 DIAGNOSIS — Z1331 Encounter for screening for depression: Secondary | ICD-10-CM | POA: Diagnosis not present

## 2024-02-23 DIAGNOSIS — Z0000 Encounter for general adult medical examination without abnormal findings: Secondary | ICD-10-CM | POA: Diagnosis not present

## 2024-02-23 DIAGNOSIS — Z6828 Body mass index (BMI) 28.0-28.9, adult: Secondary | ICD-10-CM | POA: Diagnosis not present

## 2024-02-27 DIAGNOSIS — I5022 Chronic systolic (congestive) heart failure: Secondary | ICD-10-CM | POA: Diagnosis not present

## 2024-02-27 DIAGNOSIS — F5101 Primary insomnia: Secondary | ICD-10-CM | POA: Diagnosis not present

## 2024-02-27 DIAGNOSIS — Z6828 Body mass index (BMI) 28.0-28.9, adult: Secondary | ICD-10-CM | POA: Diagnosis not present

## 2024-02-27 DIAGNOSIS — I25119 Atherosclerotic heart disease of native coronary artery with unspecified angina pectoris: Secondary | ICD-10-CM | POA: Diagnosis not present

## 2024-02-27 DIAGNOSIS — N1831 Chronic kidney disease, stage 3a: Secondary | ICD-10-CM | POA: Diagnosis not present

## 2024-02-27 DIAGNOSIS — R972 Elevated prostate specific antigen [PSA]: Secondary | ICD-10-CM | POA: Diagnosis not present

## 2024-02-27 DIAGNOSIS — I482 Chronic atrial fibrillation, unspecified: Secondary | ICD-10-CM | POA: Diagnosis not present

## 2024-02-27 DIAGNOSIS — Z86711 Personal history of pulmonary embolism: Secondary | ICD-10-CM | POA: Diagnosis not present

## 2024-02-27 DIAGNOSIS — E1165 Type 2 diabetes mellitus with hyperglycemia: Secondary | ICD-10-CM | POA: Diagnosis not present

## 2024-03-20 DIAGNOSIS — R002 Palpitations: Secondary | ICD-10-CM | POA: Diagnosis not present

## 2024-03-20 DIAGNOSIS — I4892 Unspecified atrial flutter: Secondary | ICD-10-CM | POA: Diagnosis not present

## 2024-03-20 DIAGNOSIS — R9431 Abnormal electrocardiogram [ECG] [EKG]: Secondary | ICD-10-CM | POA: Diagnosis not present

## 2024-03-20 DIAGNOSIS — I4891 Unspecified atrial fibrillation: Secondary | ICD-10-CM | POA: Diagnosis not present

## 2024-03-20 DIAGNOSIS — I454 Nonspecific intraventricular block: Secondary | ICD-10-CM | POA: Diagnosis not present

## 2024-03-20 DIAGNOSIS — I4819 Other persistent atrial fibrillation: Secondary | ICD-10-CM | POA: Diagnosis not present

## 2024-03-20 DIAGNOSIS — I517 Cardiomegaly: Secondary | ICD-10-CM | POA: Diagnosis not present
